# Patient Record
Sex: Female | Born: 1935 | Race: Black or African American | Hispanic: No | Marital: Single | Smoking: Never smoker
Health system: Southern US, Community
[De-identification: ages and names within clinical notes are randomized; demographics above are authoritative.]

## PROBLEM LIST (undated history)

## (undated) DIAGNOSIS — I10 Essential (primary) hypertension: Secondary | ICD-10-CM

## (undated) DIAGNOSIS — K922 Gastrointestinal hemorrhage, unspecified: Secondary | ICD-10-CM

## (undated) DIAGNOSIS — E119 Type 2 diabetes mellitus without complications: Secondary | ICD-10-CM

## (undated) DIAGNOSIS — D649 Anemia, unspecified: Secondary | ICD-10-CM

## (undated) DIAGNOSIS — K5790 Diverticulosis of intestine, part unspecified, without perforation or abscess without bleeding: Secondary | ICD-10-CM

## (undated) HISTORY — PX: ABDOMINAL HYSTERECTOMY: SHX81

---

## 2015-08-24 DIAGNOSIS — I10 Essential (primary) hypertension: Secondary | ICD-10-CM | POA: Diagnosis not present

## 2015-08-24 DIAGNOSIS — E119 Type 2 diabetes mellitus without complications: Secondary | ICD-10-CM | POA: Diagnosis not present

## 2015-08-24 DIAGNOSIS — M2040 Other hammer toe(s) (acquired), unspecified foot: Secondary | ICD-10-CM | POA: Diagnosis not present

## 2015-08-24 DIAGNOSIS — E785 Hyperlipidemia, unspecified: Secondary | ICD-10-CM | POA: Diagnosis not present

## 2015-09-20 DIAGNOSIS — K922 Gastrointestinal hemorrhage, unspecified: Secondary | ICD-10-CM

## 2015-09-20 HISTORY — DX: Gastrointestinal hemorrhage, unspecified: K92.2

## 2015-09-22 ENCOUNTER — Encounter (HOSPITAL_COMMUNITY): Payer: Self-pay | Admitting: Family Medicine

## 2015-09-22 ENCOUNTER — Emergency Department (HOSPITAL_COMMUNITY): Payer: Medicare Other

## 2015-09-22 ENCOUNTER — Observation Stay (HOSPITAL_COMMUNITY): Payer: Medicare Other

## 2015-09-22 ENCOUNTER — Inpatient Hospital Stay (HOSPITAL_COMMUNITY)
Admission: EM | Admit: 2015-09-22 | Discharge: 2015-09-29 | DRG: 378 | Disposition: A | Discharge status: Home Health | Payer: Medicare Other | Attending: Internal Medicine | Admitting: Internal Medicine

## 2015-09-22 DIAGNOSIS — K5733 Diverticulitis of large intestine without perforation or abscess with bleeding: Principal | ICD-10-CM | POA: Diagnosis present

## 2015-09-22 DIAGNOSIS — Z23 Encounter for immunization: Secondary | ICD-10-CM | POA: Diagnosis not present

## 2015-09-22 DIAGNOSIS — Z6821 Body mass index (BMI) 21.0-21.9, adult: Secondary | ICD-10-CM | POA: Diagnosis not present

## 2015-09-22 DIAGNOSIS — D62 Acute posthemorrhagic anemia: Secondary | ICD-10-CM

## 2015-09-22 DIAGNOSIS — K5521 Angiodysplasia of colon with hemorrhage: Secondary | ICD-10-CM | POA: Diagnosis present

## 2015-09-22 DIAGNOSIS — K922 Gastrointestinal hemorrhage, unspecified: Secondary | ICD-10-CM

## 2015-09-22 DIAGNOSIS — G2581 Restless legs syndrome: Secondary | ICD-10-CM

## 2015-09-22 DIAGNOSIS — R159 Full incontinence of feces: Secondary | ICD-10-CM | POA: Diagnosis present

## 2015-09-22 DIAGNOSIS — R000 Tachycardia, unspecified: Secondary | ICD-10-CM | POA: Diagnosis not present

## 2015-09-22 DIAGNOSIS — E119 Type 2 diabetes mellitus without complications: Secondary | ICD-10-CM | POA: Diagnosis not present

## 2015-09-22 DIAGNOSIS — Z862 Personal history of diseases of the blood and blood-forming organs and certain disorders involving the immune mechanism: Secondary | ICD-10-CM | POA: Diagnosis not present

## 2015-09-22 DIAGNOSIS — M1990 Unspecified osteoarthritis, unspecified site: Secondary | ICD-10-CM

## 2015-09-22 DIAGNOSIS — K625 Hemorrhage of anus and rectum: Secondary | ICD-10-CM

## 2015-09-22 DIAGNOSIS — K5793 Diverticulitis of intestine, part unspecified, without perforation or abscess with bleeding: Secondary | ICD-10-CM

## 2015-09-22 DIAGNOSIS — I959 Hypotension, unspecified: Secondary | ICD-10-CM | POA: Diagnosis present

## 2015-09-22 DIAGNOSIS — Z791 Long term (current) use of non-steroidal anti-inflammatories (NSAID): Secondary | ICD-10-CM | POA: Diagnosis not present

## 2015-09-22 DIAGNOSIS — K5730 Diverticulosis of large intestine without perforation or abscess without bleeding: Secondary | ICD-10-CM | POA: Diagnosis not present

## 2015-09-22 DIAGNOSIS — K921 Melena: Secondary | ICD-10-CM

## 2015-09-22 DIAGNOSIS — Z7984 Long term (current) use of oral hypoglycemic drugs: Secondary | ICD-10-CM | POA: Diagnosis not present

## 2015-09-22 DIAGNOSIS — E440 Moderate protein-calorie malnutrition: Secondary | ICD-10-CM | POA: Diagnosis not present

## 2015-09-22 DIAGNOSIS — Z978 Presence of other specified devices: Secondary | ICD-10-CM | POA: Diagnosis not present

## 2015-09-22 DIAGNOSIS — I10 Essential (primary) hypertension: Secondary | ICD-10-CM

## 2015-09-22 DIAGNOSIS — I517 Cardiomegaly: Secondary | ICD-10-CM | POA: Diagnosis not present

## 2015-09-22 DIAGNOSIS — I119 Hypertensive heart disease without heart failure: Secondary | ICD-10-CM | POA: Diagnosis present

## 2015-09-22 DIAGNOSIS — D649 Anemia, unspecified: Secondary | ICD-10-CM

## 2015-09-22 HISTORY — DX: Essential (primary) hypertension: I10

## 2015-09-22 LAB — BASIC METABOLIC PANEL
ANION GAP: 11 (ref 5–15)
BUN: 25 mg/dL — ABNORMAL HIGH (ref 6–20)
CALCIUM: 9.6 mg/dL (ref 8.9–10.3)
CHLORIDE: 104 mmol/L (ref 101–111)
CO2: 22 mmol/L (ref 22–32)
Creatinine, Ser: 1.05 mg/dL — ABNORMAL HIGH (ref 0.44–1.00)
GFR calc non Af Amer: 44 mL/min — ABNORMAL LOW (ref >60)
GFR, EST AFRICAN AMERICAN: 51 mL/min — AB (ref >60)
Glucose, Bld: 148 mg/dL — ABNORMAL HIGH (ref 65–99)
Potassium: 5.2 mmol/L — ABNORMAL HIGH (ref 3.5–5.1)
Sodium: 137 mmol/L (ref 135–145)

## 2015-09-22 LAB — CBC WITH DIFFERENTIAL/PLATELET
BASOS PCT: 0 %
Basophils Absolute: 0 10*3/uL (ref 0.0–0.1)
Eosinophils Absolute: 0 10*3/uL (ref 0.0–0.7)
Eosinophils Relative: 0 %
HEMATOCRIT: 29.4 % — AB (ref 36.0–46.0)
HEMOGLOBIN: 8.9 g/dL — AB (ref 12.0–15.0)
LYMPHS ABS: 1.1 10*3/uL (ref 0.7–4.0)
Lymphocytes Relative: 11 %
MCH: 26.3 pg (ref 26.0–34.0)
MCHC: 30.3 g/dL (ref 30.0–36.0)
MCV: 86.7 fL (ref 78.0–100.0)
MONOS PCT: 4 %
Monocytes Absolute: 0.4 10*3/uL (ref 0.1–1.0)
NEUTROS ABS: 8.7 10*3/uL — AB (ref 1.7–7.7)
NEUTROS PCT: 85 %
Platelets: 232 10*3/uL (ref 150–400)
RBC: 3.39 MIL/uL — AB (ref 3.87–5.11)
RDW: 12.9 % (ref 11.5–15.5)
WBC: 10.2 10*3/uL (ref 4.0–10.5)

## 2015-09-22 LAB — CBC
HCT: 20.7 % — ABNORMAL LOW (ref 36.0–46.0)
HEMOGLOBIN: 6.7 g/dL — AB (ref 12.0–15.0)
MCH: 28 pg (ref 26.0–34.0)
MCHC: 32.4 g/dL (ref 30.0–36.0)
MCV: 86.6 fL (ref 78.0–100.0)
Platelets: 174 10*3/uL (ref 150–400)
RBC: 2.39 MIL/uL — AB (ref 3.87–5.11)
RDW: 13.5 % (ref 11.5–15.5)
WBC: 13 10*3/uL — ABNORMAL HIGH (ref 4.0–10.5)

## 2015-09-22 LAB — PREPARE RBC (CROSSMATCH)

## 2015-09-22 LAB — PROTIME-INR
INR: 1.09 (ref 0.00–1.49)
Prothrombin Time: 14.3 seconds (ref 11.6–15.2)

## 2015-09-22 LAB — CBG MONITORING, ED: GLUCOSE-CAPILLARY: 74 mg/dL (ref 65–99)

## 2015-09-22 LAB — GLUCOSE, CAPILLARY
Glucose-Capillary: 111 mg/dL — ABNORMAL HIGH (ref 65–99)
Glucose-Capillary: 131 mg/dL — ABNORMAL HIGH (ref 65–99)

## 2015-09-22 LAB — ABO/RH: ABO/RH(D): O POS

## 2015-09-22 MED ORDER — INSULIN ASPART 100 UNIT/ML ~~LOC~~ SOLN
0.0000 [IU] | SUBCUTANEOUS | Status: DC
  Administered 2015-09-23 (×2): 1 [IU] via SUBCUTANEOUS
  Administered 2015-09-24: 2 [IU] via SUBCUTANEOUS
  Administered 2015-09-24 – 2015-09-25 (×4): 1 [IU] via SUBCUTANEOUS
  Administered 2015-09-25: 2 [IU] via SUBCUTANEOUS
  Administered 2015-09-26: 3 [IU] via SUBCUTANEOUS
  Administered 2015-09-26: 1 [IU] via SUBCUTANEOUS

## 2015-09-22 MED ORDER — SODIUM CHLORIDE 0.9 % IV BOLUS (SEPSIS)
1000.0000 mL | Freq: Once | INTRAVENOUS | Status: AC
  Administered 2015-09-22: 1000 mL via INTRAVENOUS

## 2015-09-22 MED ORDER — SODIUM CHLORIDE 0.9 % IV SOLN: Freq: Once | INTRAVENOUS | Status: DC

## 2015-09-22 MED ORDER — SODIUM CHLORIDE 0.9 % IV SOLN: INTRAVENOUS | Status: AC

## 2015-09-22 MED ORDER — MIDAZOLAM HCL 2 MG/2ML IJ SOLN
INTRAMUSCULAR | Status: AC
  Filled 2015-09-22: qty 2

## 2015-09-22 MED ORDER — FENTANYL CITRATE (PF) 100 MCG/2ML IJ SOLN
INTRAMUSCULAR | Status: AC | PRN
  Administered 2015-09-22: 37.5 ug via INTRAVENOUS
  Administered 2015-09-22: 12.5 ug via INTRAVENOUS

## 2015-09-22 MED ORDER — FENTANYL CITRATE (PF) 100 MCG/2ML IJ SOLN
INTRAMUSCULAR | Status: AC
  Filled 2015-09-22: qty 2

## 2015-09-22 MED ORDER — IOPAMIDOL (ISOVUE-300) INJECTION 61%
INTRAVENOUS | Status: AC
  Administered 2015-09-22: 100 mL
  Filled 2015-09-22: qty 100

## 2015-09-22 MED ORDER — SODIUM CHLORIDE 0.9 % IV BOLUS (SEPSIS)
500.0000 mL | Freq: Once | INTRAVENOUS | Status: AC
  Administered 2015-09-22: 500 mL via INTRAVENOUS

## 2015-09-22 MED ORDER — PANTOPRAZOLE SODIUM 40 MG IV SOLR
40.0000 mg | Freq: Once | INTRAVENOUS | Status: AC
  Administered 2015-09-22: 40 mg via INTRAVENOUS
  Filled 2015-09-22: qty 40

## 2015-09-22 MED ORDER — SODIUM CHLORIDE 0.9 % IV SOLN
INTRAVENOUS | Status: AC
  Administered 2015-09-22: 14:00:00 via INTRAVENOUS

## 2015-09-22 MED ORDER — LIDOCAINE HCL 1 % IJ SOLN
INTRAMUSCULAR | Status: AC
  Filled 2015-09-22: qty 20

## 2015-09-22 MED ORDER — TECHNETIUM TC 99M-LABELED RED BLOOD CELLS IV KIT
25.0000 | PACK | Freq: Once | INTRAVENOUS | Status: AC | PRN
  Administered 2015-09-22: 25 via INTRAVENOUS

## 2015-09-22 MED ORDER — SODIUM CHLORIDE 0.9 % IV SOLN
INTRAVENOUS | Status: AC
  Administered 2015-09-22: 22:00:00 via INTRAVENOUS

## 2015-09-22 MED ORDER — CEFAZOLIN SODIUM 1-5 GM-% IV SOLN
INTRAVENOUS | Status: AC
  Filled 2015-09-22: qty 50

## 2015-09-22 MED ORDER — MIDAZOLAM HCL 2 MG/2ML IJ SOLN
INTRAMUSCULAR | Status: AC | PRN
  Administered 2015-09-22 (×3): 0.5 mg via INTRAVENOUS

## 2015-09-22 NOTE — Sedation Documentation (Signed)
Patient denies pain and is resting comfortably.  

## 2015-09-22 NOTE — ED Provider Notes (Signed)
Medical screening examination/treatment/procedure(s) were conducted as a shared visit with non-physician practitioner(s) and myself.  I personally evaluated the patient during the encounter.   EKG Interpretation None      82 year old female who presents with lower GI bleeding. She has a history of hypertension, and is not on any blood thinners. States that she has been in her usual state of health until yesterday evening she had low abdominal cramping. Early this a.m. had episode of passing blood to her rectum. Currently denies any chest pain, abdominal pain, lightheadedness, syncope, or shortness of breath. States that she does have a remote history of bleeding from her rectum, which resolved after a day. States that remotely she did have a colonoscopy, but is unsure of what it suggested. On arrival, is mildly tachycardic but normotensive. Has a soft and benign abdomen. With anemia of 8.9, but no prior hemoglobin for comparison. Does have evidence of hematochezia on exam. Discussed with Dr. Beverely RisenGanam from GI who will see in the hospital. Admitted to hospitalist service for ongoing management.  Lavera Guiseana Duo Tasheema Perrone, MD 09/22/15 [phone]

## 2015-09-22 NOTE — Consult Note (Signed)
Subjective:   HPI  The patient is a 82 year old female who was brought to the emergency room by her daughter because earlier today she noticed reddish and darkish reddish blood running down her leg and on the floor. According to the patient the blood was a darker reddish color between the color of stool and red blood and there were some clots. She denied abdominal pain. She denied vomiting or hematemesis. She has never had an ulcer. She does not take aspirin or NSAIDs.  Review of Systems No chest pain or shortness of breath  Past Medical History  Diagnosis Date  . Hypertension    History reviewed. No pertinent past surgical history. Social History   Social History  . Marital Status: Single    Spouse Name: N/A  . Number of Children: N/A  . Years of Education: N/A   Occupational History  . Not on file.   Social History Main Topics  . Smoking status: Never Smoker   . Smokeless tobacco: Not on file  . Alcohol Use: Not on file  . Drug Use: Not on file  . Sexual Activity: Not on file   Other Topics Concern  . Not on file   Social History Narrative  . No narrative on file   family history is not on file.  Current facility-administered medications:  .  0.9 %  sodium chloride infusion, , Intravenous, STAT, Emily West, PA-C, Last Rate: 75 mL/hr at 09/22/15 1335 No current outpatient prescriptions on file. No Known Allergies   Objective:     BP 122/67 mmHg  Pulse 99  Temp(Src) 97.9 F (36.6 C) (Oral)  Resp 24  SpO2 100%  She is in no acute distress  Heart regular rhythm no murmurs  Lungs clear  Abdomen: Bowel sounds normal, soft, nontender  BUN is 25  Laboratory No components found for: D1    Assessment:     Lower GI bleed. Given the description of her bleeding and the BUN being only 25 believe this is a lower GI bleed. She also had no upper GI symptoms and she is not sick appearing or hypotensive.      Plan:     Admit. Transfuse blood. Obtain nuclear  medicine GI bleeding scan. If positive discuss with interventional radiology about embolization. If negative treat symptomatically and observe. We will follow. Lab Results  Component Value Date   HGB 8.9* 09/22/2015   HCT 29.4* 09/22/2015

## 2015-09-22 NOTE — Progress Notes (Signed)
Spoke with Dr. Bonnielee HaffHoss of IR service about embolization procedure which was unsuccessful in resolving active lower GI bleed at the splenic flexure. She was noted to be hypotensive. Based on this he recommends with transfer to an ICU bed for close monitoring and supportive care. Agree with recommendation. Spoke with Dr. Madilyn FiremanHayes from GI service recommending management as appropriate for active diverticular or other lower GI bleed. Initial plan to include frequent status check and vitals, IVF replacement and blood products.  Physical exam was repeated on arrival to 53M unit after IR procedure: Vital signs tachycardic at 114bpm with BP 113/64. Patient was alert and oriented x3 following all commands during exam, motor strength 5/5 b/l Heart rate is mildly tachycardic with a regular rhythm, no JVD, palpable pulses b/l upper and lower extremities, Abdomen is minimally TTP, nondistended Skin condition is warm and dry with grossly normal turgor, Red blood present on perianal skin surfaces with dark clots  PCCM was consulted regarding this patient. Initial plan will be management as stepdown patient on medicine service. Low threshold for fluid boluses if worsening hypotension. Recheck CBC 1 hour post transfusion then q6hrs. If Hgb<8 will repeat transfusion. If signs of active bleeding noted will repeat transfusion. If she becomes hypotensive refractory to IV fluid boluses will plan on transfer to ICU service. We will discuss with the family if they would want surgical consultation versus continued supportive care in this 82 y/o woman.

## 2015-09-22 NOTE — H&P (Signed)
Date: 09/22/2015               Patient Name:  Sharon Conway MRN: [id number]  DOB: 1918/06/07 Age / Sex: 82 y.o., female   PCP: No primary care provider on file.              Medical Service: Internal Medicine Teaching Service              Attending Physician: Dr. Inez Catalina, MD    First Contact: Nida Boatman, MS 3 Pager: [phone]  Second Contact: Dr. Valentino Nose Pager: [phone]  Third Contact Dr. Hyacinth Meeker Pager: [phone]       After Hours (After 5p/  First Contact Pager: [phone]  weekends / holidays): Second Contact Pager: [phone]   Chief Complaint:  Bright red blood per rectum  History of Present Illness: Ms. Woolverton is a 82 yo F with PMH of HTN, DM, arthritis, and RLS, previously in good health, who presents to the ED with one day of passing bright red blood during bowel movements.  Pt says she ate fish last night and developed diffuse cramping abdominal pain.  This morning she had an episode of fecal incontinence with bright red blood.  Bleeding was brisk enough to travel down her leg and stain the carpet at her home.  She passed both clotted blood and free blood.  Had a similar episode upon arrival at ED.  Pt states that her abdominal pain from yesterday resolved overnight, but that this GI bleeding is new.  For the past three days, she has had 1 episode of watery, non-bloody diarrhea per day, with no associated nausea or vomiting.  Pt states that she had a similar episode of rectal bleeding years ago which lasted one day and resolved on its own.  Pt normally has a soft bowel movement every 4 days - she does not use stool softeners.  She has SOB when ambulating, but this is unchanged from baseline.  Pt's daughter states that pt complained of SOB when walking the dog last week.  She also endorses lower leg swelling, unchanged from baseline.  She denies any recent illnesses.  Pt was treated for a UTI 6 months ago, but has no recent abx use.  No recent changes in medications, but is on  Meloxicam for arthritis. Pt denies any history of hemorrhoids, diverticular disease, PUD, anemia.  Denies CP, changes in vision, headaches.  Pt currently lives with her daughter in the [place] area, but still sees her PCP in [place].  Pt's daughter appears to be a more accurate historian than the patient.  Meds: Current Facility-Administered Medications  Medication Dose Route Frequency Provider Last Rate Last Dose  . 0.9 %  sodium chloride infusion   Intravenous STAT Trixie Dredge, PA-C 75 mL/hr at 09/22/15 1335    . 0.9 %  sodium chloride infusion   Intravenous Once Tasrif Ahmed, MD      . 0.9 %  sodium chloride infusion   Intravenous STAT Tasrif Ahmed, MD      . insulin aspart (novoLOG) injection 0-9 Units  0-9 Units Subcutaneous Q4H Tasrif Ahmed, MD   0 Units at 09/22/15 1406   Current Outpatient Prescriptions  Medication Sig Dispense Refill  . amLODipine (NORVASC) 5 MG tablet Take 5 mg by mouth daily.    . meloxicam (MOBIC) 15 MG tablet Take 15 mg by mouth daily.    . metFORMIN (GLUCOPHAGE) 500 MG tablet Take 500 mg by mouth 2 (two) times daily with  a meal.    . rOPINIRole (REQUIP) 2 MG tablet Take 2 mg by mouth at bedtime.    . solifenacin (VESICARE) 5 MG tablet Take 5 mg by mouth daily.      Allergies: NKDA  PMH:  HTN T2DM Arthritis  Surgical history: Hysterectomy   Social History: Currently lives with daughter in [place].  Never smoker.    Review of Systems: Review of Systems  Constitutional: Negative for fever and chills.  HENT: Negative for congestion.   Eyes: Negative for blurred vision and double vision.  Respiratory: Positive for cough and shortness of breath (when ambulating - unchanged from baseline). Negative for wheezing.   Cardiovascular: Positive for leg swelling (unchanged from baseline). Negative for chest pain and palpitations.  Gastrointestinal: Positive for diarrhea and blood in stool. Negative for nausea, vomiting and abdominal pain.    Genitourinary: Negative for dysuria and urgency.  Musculoskeletal: Negative for back pain and falls.  Neurological: Negative for dizziness, focal weakness, weakness and headaches.     Physical Exam: Blood pressure 129/64, pulse 100, temperature 97.9 F (36.6 C), temperature source Oral, resp. rate 26, SpO2 100 %. BP 129/64 mmHg  Pulse 100  Temp(Src) 97.9 F (36.6 C) (Oral)  Resp 26  SpO2 100%  General Appearance:    Pleasant female.  Alert, cooperative, no distress.  AOx3  HEENT:    PERRL, conjunctiva/corneas clear.    Neck:   Supple, symmetrical, trachea midline, no adenopathy.  Lungs:     Clear to auscultation bilaterally, respirations unlabored   CV:     Regular rate and rhythm. No murmur, rub or gallop.  No JVD.  2+ radial and DP pulses.  Normal cap refill.   Abdomen:     Soft, non-tender, bowel sounds active all four quadrants,    no masses, no organomegaly  Extremities:   1+ edema up to ankles in bilateral lower extremities.   Skin:   Skin color, texture, turgor normal, no rashes or lesions  Neurologic:   Normal strength and ROM.     Lab results: CBC    Component Value Date/Time   WBC 10.2 09/22/2015 1048   RBC 3.39* 09/22/2015 1048   HGB 8.9* 09/22/2015 1048   HCT 29.4* 09/22/2015 1048   PLT 232 09/22/2015 1048   MCV 86.7 09/22/2015 1048   MCH 26.3 09/22/2015 1048   MCHC 30.3 09/22/2015 1048   RDW 12.9 09/22/2015 1048   LYMPHSABS 1.1 09/22/2015 1048   MONOABS 0.4 09/22/2015 1048   EOSABS 0.0 09/22/2015 1048   BASOSABS 0.0 09/22/2015 1048   BMP Latest Ref Rng 09/22/2015  Glucose 65 - 99 mg/dL 161(W)  BUN 6 - 20 mg/dL 96(E)  Creatinine 4.54 - 1.00 mg/dL 0.98(J)  Sodium 191 - 478 mmol/L 137  Potassium 3.5 - 5.1 mmol/L 5.2(H)  Chloride 101 - 111 mmol/L 104  CO2 22 - 32 mmol/L 22  Calcium 8.9 - 10.3 mg/dL 9.6    Imaging results:  Dg Chest Portable 1 View  09/22/2015  CLINICAL DATA:  Shortness of breath on exertion with occasional cough. EXAM: PORTABLE CHEST  1 VIEW COMPARISON:  None. FINDINGS: Cardiomegaly. Aortic atherosclerosis. Hyperinflation. No active infiltrates or failure. No effusion or pneumothorax. Osteopenia. IMPRESSION: Cardiomegaly.  No active infiltrates or failure. Electronically Signed   By: Elsie Stain M.D.   On: 09/22/2015 11:52    Assessment & Plan by Problem: 82 yo F presenting with one day of BRBPR and three days of diarrhea, Hb of 8.9 and BUN  25.     Acute GI bleed with anemia Given painless bleeding, most concerned for diverticulosis, which is common in older adults and typically presents with bright red rectal bleeding without any associated symptoms.  Also on the differential is angiodysplasia/AVM given painless rectal bleeding without other associated symptoms.  Less concern for upper GI bleeding given only slightly elevated BUN - would expect BUN to be higher and vitals to be unstable if brisk upper GI bleed.  Less likely to be gastroenteritis d/t no nausea, vomiting, or fevers.  Colon cancer also on differential, but less likely given normocytic anemia not suggestive of iron deficiency anemia, which would point more towards colon cancer.  Acute mesenteric ischemia less likely given normal vital signs and no abdominal pain today. Hemorrhoids also less likely given volume of bleeding and no history of liver disease or anticoagulant use. -GI following, appreciate recs.  -Nuclear medicine tagged RBC scan to look for source of bleeding. -If NM scan identifies bleeding, will undergo angio+embolization with VIR   -Follow CBC q6.  Tranfuse if Hb <7 -Start on Protonix  -Start on NS @ 75 mL/hr  -Continue NPO pending results of tagged RBC scan    Hypertension -well-controlled -hold amlodipine in setting of acute GI bleed   DM -continue home Novolog -hold home Metformin   Arthritis -hold Meloxicam in setting of acute GI bleed   Restless leg syndrome -hold ropinirole    This is a Psychologist, occupationalMedical Student Note.  The care of the  patient was discussed with Dr. Valentino NoseNathan Boswell and the assessment and plan was formulated with their assistance.  Please see their note for official documentation of the patient encounter.   Signed: Nathaneil Canaryolleen M Jolleen Seman, Med Student 09/22/2015, 2:37 PM

## 2015-09-22 NOTE — Consult Note (Signed)
Chief Complaint: Patient was seen in consultation today for mesenteric/visceral arteriogram with possible embolization Chief Complaint  Patient presents with  . Vaginal Bleeding    Referring Physician(s): Ganem,Sam  Supervising Physician: Ruel Favors  Patient Status: In-pt   History of Present Illness: Sharon Conway is a 82 y.o. female with PMH HTN who presented to ED today with rectal bleeding. At the time she denied abd pain,N/V . Only surgical hx noted was remote hysterectomy. She lives in [place] and is here visiting family.  She states she had similar episode years ago but didn't seek treatment or go to hospital. Currently she states she has some lower pelvic discomfort as well as some dyspnea with exertion. NM study today reveals active bleeding in sigmoid colon region. GI service now requesting visceral arteriogram with possible embolization.   Past Medical History  Diagnosis Date  . Hypertension     Past Surgical History  Procedure Laterality Date  . Abdominal hysterectomy      Allergies: Review of patient's allergies indicates no known allergies.  Medications: Prior to Admission medications   Medication Sig Start Date End Date Taking? Authorizing Provider  amLODipine (NORVASC) 5 MG tablet Take 5 mg by mouth daily.   Yes Historical Provider, MD  meloxicam (MOBIC) 15 MG tablet Take 15 mg by mouth daily.   Yes Historical Provider, MD  metFORMIN (GLUCOPHAGE) 500 MG tablet Take 500 mg by mouth 2 (two) times daily with a meal.   Yes Historical Provider, MD  rOPINIRole (REQUIP) 2 MG tablet Take 2 mg by mouth at bedtime.   Yes Historical Provider, MD  solifenacin (VESICARE) 5 MG tablet Take 5 mg by mouth daily.   Yes Historical Provider, MD     History reviewed. No pertinent family history.  Social History   Social History  . Marital Status: Single    Spouse Name: N/A  . Number of Children: N/A  . Years of Education: N/A   Social History Main Topics  .  Smoking status: Never Smoker   . Smokeless tobacco: None  . Alcohol Use: None  . Drug Use: None  . Sexual Activity: Not Asked   Other Topics Concern  . None   Social History Narrative     Review of Systems see above  Vital Signs: BP 129/64 mmHg  Pulse 100  Temp(Src) 97.9 F (36.6 C) (Oral)  Resp 26  SpO2 100%  Physical Exam  Constitutional: She is oriented to person, place, and time.  Thin BF in NAD  Cardiovascular: Regular rhythm.   Murmur heard. Sl tachy  Pulmonary/Chest: Effort normal and breath sounds normal.  Abdominal: Soft. Bowel sounds are normal.  Mild lower pelvic tenderness to palpation  Musculoskeletal: Normal range of motion. She exhibits no edema.  Neurological: She is alert and oriented to person, place, and time.    Mallampati Score:     Imaging: Nm Gi Blood Loss  09/22/2015  CLINICAL DATA:  Active GI bleed. EXAM: NUCLEAR MEDICINE GASTROINTESTINAL BLEEDING SCAN TECHNIQUE: Sequential abdominal images were obtained following intravenous administration of Tc-65m labeled red blood cells. RADIOPHARMACEUTICALS:  25.0 mCi Tc-62m in-vitro labeled red cells. COMPARISON:  None. FINDINGS: There is a tubular focus of increased uptake within the proximal left colon which increases in intensity and exhibits antegrade progression conforming to the expected configuration distal colon. Normal physiologic activity is identified within the liver, GU tract and blood pool. IMPRESSION: 1. Examination is positive for active GI bleed which appears to originate at the  level of the splenic flexure. Electronically Signed   By: Signa Kellaylor  Stroud M.D.   On: 09/22/2015 16:01   Dg Chest Portable 1 View  09/22/2015  CLINICAL DATA:  Shortness of breath on exertion with occasional cough. EXAM: PORTABLE CHEST 1 VIEW COMPARISON:  None. FINDINGS: Cardiomegaly. Aortic atherosclerosis. Hyperinflation. No active infiltrates or failure. No effusion or pneumothorax. Osteopenia. IMPRESSION:  Cardiomegaly.  No active infiltrates or failure. Electronically Signed   By: Elsie StainJohn T Curnes M.D.   On: 09/22/2015 11:52    Labs:  CBC:  Recent Labs  09/22/15 1048  WBC 10.2  HGB 8.9*  HCT 29.4*  PLT 232    COAGS: No results for input(s): INR, APTT in the last 8760 hours.  BMP:  Recent Labs  09/22/15 1048  NA 137  K 5.2*  CL 104  CO2 22  GLUCOSE 148*  BUN 25*  CALCIUM 9.6  CREATININE 1.05*  GFRNONAA 44*  GFRAA 51*    LIVER FUNCTION TESTS: No results for input(s): BILITOT, AST, ALT, ALKPHOS, PROT, ALBUMIN in the last 8760 hours.  TUMOR MARKERS: No results for input(s): AFPTM, CEA, CA199, CHROMGRNA in the last 8760 hours.  Assessment and Plan: Pt with acute GI bleed with positive NM study in sigmoid colon region. Plan today is for visceral arteriogram with poss embolization . Risks and benefits discussed with the patient/family including, but not limited to bleeding, infection, vascular injury or contrast induced renal failure.All of the patient's questions were answered, patient is agreeable to proceed. Consent signed and in chart. Labs checked.     Thank you for this interesting consult.  I greatly enjoyed meeting Franco Nonesdna Termini and look forward to participating in their care.  A copy of this report was sent to the requesting provider on this date.  Electronically Signed: D. Jeananne RamaKevin Shaletta Hinostroza 09/22/2015, 4:07 PM   I spent a total of 40 minutes in face to face in clinical consultation, greater than 50% of which was counseling/coordinating care for mesenteric arteriogram with possible embolization

## 2015-09-22 NOTE — Progress Notes (Signed)
CRITICAL VALUE ALERT  Critical value received:  Hemoglobin 6.7   Date of notification: 09/22/15  Time of notification:  2221  Critical value read back:Yes.    Nurse who received alert:  Osvaldo AngstEllie Amelie Caracci Rn  MD notified (1st page):  N/A  Time of first page:  N/A  MD notified (2nd page):  Time of second page:  Responding MD: E. Hoffman  Time MD responded:2225  New orders carried out. Will continue to monitor. Briant CedarFraizer, Rykar Lebleu RN BSN

## 2015-09-22 NOTE — ED Notes (Signed)
Pt here for bleeding. sts unsure if its from rectum or vagina. sts started this am. sts some abd cramping this am. Denies pain now.

## 2015-09-22 NOTE — ED Provider Notes (Signed)
CSN: [id number]     Arrival date & time 09/22/15  1191 History   None    Chief Complaint  Patient presents with  . Vaginal Bleeding     (Consider location/radiation/quality/duration/timing/severity/associated sxs/prior Treatment) HPI   Pt with hx HTN presents with rectal bleeding.  States last night around midnight her abdomen started hurting, this resolved, and then this morning she woke up with blood running down her legs and dripping on the floor.  She denies any pain presently.  Has irregular bowel movements at baseline, has had diarrhea for the past 3 days.  Daughter notes she has been coughing and had strange cough while she was bleeding this morning.  She is not on blood thinners.  Denies fevers, chills, myalgias, generalized weakness, urinary symptoms.  She is s/p hysterectomy as a young person.  Denies any other abdominal surgeries.  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  PCP is in [place], [place].     Past Medical History  Diagnosis Date  . Hypertension    History reviewed. No pertinent past surgical history. History reviewed. No pertinent family history. Social History  Substance Use Topics  . Smoking status: Never Smoker   . Smokeless tobacco: None  . Alcohol Use: None   OB History    No data available     Review of Systems  All other systems reviewed and are negative.     Allergies  Review of patient's allergies indicates no known allergies.  Home Medications   Prior to Admission medications   Not on File   BP 113/57 mmHg  Pulse 110  Temp(Src) 97.9 F (36.6 C) (Oral)  Resp 16  SpO2 100% Physical Exam  Constitutional: She appears well-developed and well-nourished. No distress.  HENT:  Head: Normocephalic and atraumatic.  Neck: Neck supple.  Cardiovascular: Normal rate and  regular rhythm.   Pulmonary/Chest: Effort normal and breath sounds normal. No respiratory distress. She has no wheezes. She has no rales.  Abdominal: Soft. She exhibits no distension and no mass. There is no tenderness. There is no rebound and no guarding.  Genitourinary: Rectal exam shows no external hemorrhoid, no mass, no tenderness and anal tone normal.  Adult diaper with large amount of dark red blood with clots.  Perianal area cleansed prior to rectal exam - frank dark red blood coating glove but not flowing from rectum.  No stool noted on glove.    Neurological: She is alert.  Skin: She is not diaphoretic.  Nursing note and vitals reviewed.   ED Course  Procedures (including critical care time) Labs Review Labs Reviewed  BASIC METABOLIC PANEL - Abnormal; Notable for the following:    Potassium 5.2 (*)    Glucose, Bld 148 (*)    BUN 25 (*)    Creatinine, Ser 1.05 (*)    GFR calc non Af Amer 44 (*)    GFR calc Af Amer 51 (*)    All other components within normal limits  CBC WITH DIFFERENTIAL/PLATELET - Abnormal; Notable for the following:    RBC 3.39 (*)    Hemoglobin 8.9 (*)    HCT 29.4 (*)    Neutro Abs 8.7 (*)    All other components within normal limits  CBC  TYPE AND SCREEN  ABO/RH    Imaging Review Dg Chest  Portable 1 View  09/22/2015  CLINICAL DATA:  Shortness of breath on exertion with occasional cough. EXAM: PORTABLE CHEST 1 VIEW COMPARISON:  None. FINDINGS: Cardiomegaly. Aortic atherosclerosis. Hyperinflation. No active infiltrates or failure. No effusion or pneumothorax. Osteopenia. IMPRESSION: Cardiomegaly.  No active infiltrates or failure. Electronically Signed   By: Elsie StainJohn T Curnes M.D.   On: 09/22/2015 11:52   I have personally reviewed and evaluated these images and lab results as part of my medical decision-making.   EKG Interpretation None       11:37 AM I spoke with Dr Evette CristalGanem (GI) who agrees to consult, requests admission to medicine.    12:34 PM  Admitted to Internal Medicine Teaching Service.    MDM   Final diagnoses:  Gastrointestinal hemorrhage, unspecified gastritis, unspecified gastrointestinal hemorrhage type    Afebrile nontoxic patient with painless GI bleed that began this morning.  Pt has hx HTN, on unknown medication but denies blood thinner usage.  Hgb 8.9, no prior labs available.  Pt mildly tachycardic on arrival, notes SOB with exertion at baseline that is unchanged.  Discussed pt, workup, and treatment with Dr Verdie MosherLiu.  Discussed with GI, Dr Evette CristalGanem, who will see patient in consult.  Admitted to Internal Medicine Teaching Service.    Trixie Dredgemily Leonna Schlee, PA-C 09/22/15 1302  Lavera Guiseana Duo Liu, MD 09/22/15 [phone]

## 2015-09-22 NOTE — H&P (Signed)
Date: 09/22/2015               Patient Name:  Sharon Conway MRN: [id number]  DOB: Oct 24, 1918 Age / Sex: 82 y.o., female   PCP: No primary care provider on file.         Medical Service: Internal Medicine Teaching Service         Attending Physician: Dr. Inez Catalina, MD    First Contact: Nida Boatman, MS 3 Pager: [phone]  Second Contact: Dr. Valentino Nose Pager: [phone]  Third Contact Dr. Hyacinth Meeker Pager: [phone]                     After Hours (After 5p/  First Contact Pager: [phone]  weekends / holidays): Second Contact Pager: [phone]   Chief Complaint: Rectal Bleeding  History of Present Illness: Ms. Greaves is a pleasant 82 year old female with a past medical history of HTN, DM2, arthritis and RLS who presents to the ED today with complaints of bright red blood per rectum. Patient reports that last night she developed some abdominal cramping pain and discomfort after dinner. Had one episode of diarrhea last night with no blood or melena present. This morning she had an episode of stool incontinence with a grossly bloody bowel movement. Both bright red blood and blood clots were noted at that time that ran down her legs and stained the carpet. She denies any further abdominal pain since last night. Denies any nausea or vomiting. Takes Meloxicam for arthritis but denies any other NSAID use or aspirin use. Denies any history of ulcers. Does report a remote history of bright red blood per rectum several years ago but resolved on its own without seeing any physicians. Patient reports dyspnea with exertion but unchanged from baseline. Denies any chest pain, sob at rest, vision changes, lightheadedness/dizziness.   Meds: Current Facility-Administered Medications  Medication Dose Route Frequency Provider Last Rate Last Dose  . 0.9 %  sodium chloride infusion   Intravenous STAT Trixie Dredge, PA-C 75 mL/hr at 09/22/15 1335    . 0.9 %  sodium chloride infusion   Intravenous Once  Tasrif Ahmed, MD      . 0.9 %  sodium chloride infusion   Intravenous STAT Tasrif Ahmed, MD      . insulin aspart (novoLOG) injection 0-9 Units  0-9 Units Subcutaneous Q4H Tasrif Ahmed, MD   0 Units at 09/22/15 1406   Current Outpatient Prescriptions  Medication Sig Dispense Refill  . amLODipine (NORVASC) 5 MG tablet Take 5 mg by mouth daily.    . meloxicam (MOBIC) 15 MG tablet Take 15 mg by mouth daily.    . metFORMIN (GLUCOPHAGE) 500 MG tablet Take 500 mg by mouth 2 (two) times daily with a meal.    . rOPINIRole (REQUIP) 2 MG tablet Take 2 mg by mouth at bedtime.    . solifenacin (VESICARE) 5 MG tablet Take 5 mg by mouth daily.      Allergies: Allergies as of 09/22/2015  . (No Known Allergies)   Past Medical History  Diagnosis Date  . Hypertension    Past Surgical History  Procedure Laterality Date  . Abdominal hysterectomy     History reviewed. No pertinent family history. Social History   Social History  . Marital Status: Single    Spouse Name: N/A  . Number of Children: N/A  . Years of Education: N/A   Occupational History  . Not on file.   Social History  Main Topics  . Smoking status: Never Smoker   . Smokeless tobacco: Not on file  . Alcohol Use: Not on file  . Drug Use: Not on file  . Sexual Activity: Not on file   Other Topics Concern  . Not on file   Social History Narrative    Review of Systems: Pertinent items noted in HPI and remainder of comprehensive ROS otherwise negative.  Physical Exam: Blood pressure 129/64, pulse 100, temperature 97.9 F (36.6 C), temperature source Oral, resp. rate 26, SpO2 100 %. General: alert, well-developed, and cooperative to examination.  Head: normocephalic and atraumatic.  Eyes: vision grossly intact, pupils equal, pupils round, pupils reactive to light, no injection and anicteric.  Mouth: pharynx pink and dry, no erythema, and no exudates.  Neck: supple, full ROM, no thyromegaly, no JVD, and no carotid bruits.    Lungs: normal respiratory effort, no accessory muscle use, normal breath sounds, no crackles, and no wheezes. Heart: normal rate, regular rhythm, no murmur, no gallop, and no rub.  Abdomen: soft, non-tender, normal bowel sounds, no distention, no guarding, no rebound tenderness Msk: no joint swelling, no joint warmth, and no redness over joints.  Pulses: 2+ DP/PT pulses bilaterally Extremities: trace pedal edema Neurologic: alert & oriented X3, no focal deficits Skin: turgor normal and no rashes.  Psych: normal mood and affect  Lab results: Basic Metabolic Panel:  Recent Labs  16/02/9604/03/17 1048  NA 137  K 5.2*  CL 104  CO2 22  GLUCOSE 148*  BUN 25*  CREATININE 1.05*  CALCIUM 9.6   CBC:  Recent Labs  09/22/15 1048  WBC 10.2  NEUTROABS 8.7*  HGB 8.9*  HCT 29.4*  MCV 86.7  PLT 232   CBG:  Recent Labs  09/22/15 1406  GLUCAP 74   Imaging results:  Nm Gi Blood Loss  09/22/2015  CLINICAL DATA:  Active GI bleed. EXAM: NUCLEAR MEDICINE GASTROINTESTINAL BLEEDING SCAN TECHNIQUE: Sequential abdominal images were obtained following intravenous administration of Tc-8667m labeled red blood cells. RADIOPHARMACEUTICALS:  25.0 mCi Tc-1767m in-vitro labeled red cells. COMPARISON:  None. FINDINGS: There is a tubular focus of increased uptake within the proximal left colon which increases in intensity and exhibits antegrade progression conforming to the expected configuration distal colon. Normal physiologic activity is identified within the liver, GU tract and blood pool. IMPRESSION: 1. Examination is positive for active GI bleed which appears to originate at the level of the splenic flexure. Electronically Signed   By: Signa Kellaylor  Stroud M.D.   On: 09/22/2015 16:01   Dg Chest Portable 1 View  09/22/2015  CLINICAL DATA:  Shortness of breath on exertion with occasional cough. EXAM: PORTABLE CHEST 1 VIEW COMPARISON:  None. FINDINGS: Cardiomegaly. Aortic atherosclerosis. Hyperinflation. No active  infiltrates or failure. No effusion or pneumothorax. Osteopenia. IMPRESSION: Cardiomegaly.  No active infiltrates or failure. Electronically Signed   By: Elsie StainJohn T Curnes M.D.   On: 09/22/2015 11:52   Other results: EKG: Pending  Assessment & Plan by Problem:  Acute GI bleed with anemia: Patient with acute onset bright red blood per rectum with clotting. Most likely diverticular bleed in elderly patient. BUN not elevated suggestive of lower GI bleed. Potentially AVM or colon cancer. Hgb 8.9 on admission. Unclear baseline but likely from acute blood loss. Will need to trend CBC and transfuse as necessary. GI consulted in the ED and following.  -appreciate GI's help -NM tagged RBC scan positive -Will consult IR for angio+embolization -CBC q6hr, transfuse <7 -Protonix IV  -NS  75 mL/hr -NPO pending procedures -Check EKG -Telemetry  Hypertension: Stable. Borderline soft.  -hold amlodipine in setting of acute GI bleed  -NS as above -transfuse as needed  DM -CBG q4hr while NPO -SSI-s -hold home Metformin   Arthritis -hold Meloxicam in setting of acute GI bleed   Restless leg syndrome -hold ropinirole    Dispo: Disposition is deferred at this time, awaiting improvement of current medical problems. Anticipated discharge in approximately 1-2 day(s).   The patient does have a current PCP (No primary care provider on file.) and does need an [facility] hospital follow-up appointment after discharge.  The patient does not have transportation limitations that hinder transportation to clinic appointments.  Signed: Valentino Nose, MD 09/22/2015, 5:05 PM

## 2015-09-22 NOTE — Consult Note (Signed)
Reason for Consult:  Lower GI bleed Referring Physician: Michaele Amundson is an 82 y.o. female.  HPI:  Patient is a 82 year old female who was admitted on the third with bright red blood per rectum.  She had fecal incontinence associated with this. She says that she has not had recent GI bleeding but did have a similar episode 7 years ago. She denies pain or nausea or vomiting. She did have a brief episode of pain that precipitated the bleeding.  Patient had a bleeding scan that appeared to localize to the left upper quadrant.  Patient went for embolization which was unfortunately unsuccessful. Because of the unsuccessful embolization, the patient was recommended to be monitored in stepdown.  Patient is hemodynamically stable. Patient has had a recent stool that was nonbloody. She has not had a colonoscopy in many years.  Past Medical History  Diagnosis Date  . Hypertension   DM OA  Past Surgical History  Procedure Laterality Date  . Abdominal hysterectomy      History reviewed. No pertinent family history.  Social History:  reports that she has never smoked. She does not have any smokeless tobacco history on file. Her alcohol and drug histories are not on file.  Allergies: No Known Allergies  Medications:  Prior to Admission:  Prescriptions prior to admission  Medication Sig Dispense Refill Last Dose  . amLODipine (NORVASC) 5 MG tablet Take 5 mg by mouth daily.   09/21/2015 at Unknown time  . meloxicam (MOBIC) 15 MG tablet Take 15 mg by mouth daily.   09/21/2015 at Unknown time  . metFORMIN (GLUCOPHAGE) 500 MG tablet Take 500 mg by mouth 2 (two) times daily with a meal.   09/21/2015 at Unknown time  . rOPINIRole (REQUIP) 2 MG tablet Take 2 mg by mouth at bedtime.   09/21/2015 at Unknown time  . solifenacin (VESICARE) 5 MG tablet Take 5 mg by mouth daily.   09/21/2015 at Unknown time    Results for orders placed or performed during the hospital encounter of 09/22/15 (from the past 48  hour(s))  Type and screen     Status: None (Preliminary result)   Collection Time: 09/22/15 10:31 AM  Result Value Ref Range   ABO/RH(D) O POS    Antibody Screen NEG    Sample Expiration 09/25/2015    Unit Number [id number]    Blood Component Type RBC LR PHER2    Unit division 00    Status of Unit ISSUED    Transfusion Status OK TO TRANSFUSE    Crossmatch Result Compatible   ABO/Rh     Status: None   Collection Time: 09/22/15 10:31 AM  Result Value Ref Range   ABO/RH(D) O POS   Basic metabolic panel     Status: Abnormal   Collection Time: 09/22/15 10:48 AM  Result Value Ref Range   Sodium 137 135 - 145 mmol/L   Potassium 5.2 (H) 3.5 - 5.1 mmol/L   Chloride 104 101 - 111 mmol/L   CO2 22 22 - 32 mmol/L   Glucose, Bld 148 (H) 65 - 99 mg/dL   BUN 25 (H) 6 - 20 mg/dL   Creatinine, Ser 1.05 (H) 0.44 - 1.00 mg/dL   Calcium 9.6 8.9 - 10.3 mg/dL   GFR calc non Af Amer 44 (L) >60 mL/min   GFR calc Af Amer 51 (L) >60 mL/min    Comment: (NOTE) The eGFR has been calculated using the CKD EPI equation. This calculation has not been  validated in all clinical situations. eGFR's persistently <60 mL/min signify possible Chronic Kidney Disease.    Anion gap 11 5 - 15  CBC with Differential     Status: Abnormal   Collection Time: 09/22/15 10:48 AM  Result Value Ref Range   WBC 10.2 4.0 - 10.5 K/uL   RBC 3.39 (L) 3.87 - 5.11 MIL/uL   Hemoglobin 8.9 (L) 12.0 - 15.0 g/dL   HCT 29.4 (L) 36.0 - 46.0 %   MCV 86.7 78.0 - 100.0 fL   MCH 26.3 26.0 - 34.0 pg   MCHC 30.3 30.0 - 36.0 g/dL   RDW 12.9 11.5 - 15.5 %   Platelets 232 150 - 400 K/uL   Neutrophils Relative % 85 %   Neutro Abs 8.7 (H) 1.7 - 7.7 K/uL   Lymphocytes Relative 11 %   Lymphs Abs 1.1 0.7 - 4.0 K/uL   Monocytes Relative 4 %   Monocytes Absolute 0.4 0.1 - 1.0 K/uL   Eosinophils Relative 0 %   Eosinophils Absolute 0.0 0.0 - 0.7 K/uL   Basophils Relative 0 %   Basophils Absolute 0.0 0.0 - 0.1 K/uL  CBG monitoring, ED      Status: None   Collection Time: 09/22/15  2:06 PM  Result Value Ref Range   Glucose-Capillary 74 65 - 99 mg/dL  Protime-INR     Status: None   Collection Time: 09/22/15  4:45 PM  Result Value Ref Range   Prothrombin Time 14.3 11.6 - 15.2 seconds   INR 1.09 0.00 - 1.49  Prepare RBC     Status: None   Collection Time: 09/22/15  5:20 PM  Result Value Ref Range   Order Confirmation ORDER PROCESSED BY BLOOD BANK   Glucose, capillary     Status: Abnormal   Collection Time: 09/22/15  8:41 PM  Result Value Ref Range   Glucose-Capillary 111 (H) 65 - 99 mg/dL    Nm Gi Blood Loss  09/22/2015  CLINICAL DATA:  Active GI bleed. EXAM: NUCLEAR MEDICINE GASTROINTESTINAL BLEEDING SCAN TECHNIQUE: Sequential abdominal images were obtained following intravenous administration of Tc-75mlabeled red blood cells. RADIOPHARMACEUTICALS:  25.0 mCi Tc-979mn-vitro labeled red cells. COMPARISON:  None. FINDINGS: There is a tubular focus of increased uptake within the proximal left colon which increases in intensity and exhibits antegrade progression conforming to the expected configuration distal colon. Normal physiologic activity is identified within the liver, GU tract and blood pool. IMPRESSION: 1. Examination is positive for active GI bleed which appears to originate at the level of the splenic flexure. Electronically Signed   By: TaKerby Moors.D.   On: 09/22/2015 16:01   Dg Chest Portable 1 View  09/22/2015  CLINICAL DATA:  Shortness of breath on exertion with occasional cough. EXAM: PORTABLE CHEST 1 VIEW COMPARISON:  None. FINDINGS: Cardiomegaly. Aortic atherosclerosis. Hyperinflation. No active infiltrates or failure. No effusion or pneumothorax. Osteopenia. IMPRESSION: Cardiomegaly.  No active infiltrates or failure. Electronically Signed   By: JoStaci Righter.D.   On: 09/22/2015 11:52    Review of Systems  Constitutional: Negative.   HENT: Negative.   Eyes: Negative.   Respiratory: Negative.    Cardiovascular: Negative.   Gastrointestinal: Positive for blood in stool. Negative for heartburn, nausea, vomiting, abdominal pain and constipation.  Neurological: Positive for dizziness (earlier wtih bleeding).  Endo/Heme/Allergies: Negative.   Psychiatric/Behavioral: Negative.    Blood pressure 112/54, pulse 105, temperature 97.9 F (36.6 C), temperature source Oral, resp. rate 21, SpO2 100 %.  Physical Exam  Constitutional: She is oriented to person, place, and time. She appears well-developed and well-nourished. No distress.  HENT:  Head: Normocephalic and atraumatic.  Eyes: Conjunctivae are normal. Pupils are equal, round, and reactive to light. No scleral icterus.  Neck: Neck supple. No tracheal deviation present. No thyromegaly present.  Cardiovascular: Regular rhythm.   Sl tachycardic  Respiratory: Effort normal. No respiratory distress.  GI: Soft. She exhibits no distension and no mass. There is no tenderness. There is no rebound and no guarding.  Musculoskeletal: Normal range of motion.  Neurological: She is alert and oriented to person, place, and time. Coordination normal.  Skin: Skin is warm and dry. No rash noted. She is not diaphoretic. No erythema. No pallor.  Psychiatric: She has a normal mood and affect. Her behavior is normal. Judgment and thought content normal.    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  This would alter surgical plan.  The patient does seem pretty oriented and family confirms her history.  Would also recommend possible repeat attempt at embolization prior to engaging in surgical exploration, as surgery is most invasive and most difficult to recover from.    Pt age does make her higher risk for surgery, and this can make MS worse if any baseline dementia.  Risk does not appear prohibitive, though.    Recommend rechecking coags since transfusion and NPO status.    Would  be ideal if no tumor present and bleed resolved.    CCS service to follow.      Shloime Keilman 09/22/2015, 10:10 PM

## 2015-09-22 NOTE — Sedation Documentation (Signed)
Patient is resting comfortably. 

## 2015-09-22 NOTE — ED Notes (Signed)
Pt had one small, loosely formed, dark colored stool with frank blood.

## 2015-09-22 NOTE — ED Notes (Signed)
PA at bedside.

## 2015-09-22 NOTE — Procedures (Signed)
Mesenteric angio for lower GI bleeding  Positive for active bleeding at the splenic flexure  Embolization was unsuccessful.  No comp/EBL

## 2015-09-23 DIAGNOSIS — Z978 Presence of other specified devices: Secondary | ICD-10-CM

## 2015-09-23 DIAGNOSIS — K5793 Diverticulitis of intestine, part unspecified, without perforation or abscess with bleeding: Secondary | ICD-10-CM | POA: Insufficient documentation

## 2015-09-23 LAB — CBC
HCT: 26.8 % — ABNORMAL LOW (ref 36.0–46.0)
HEMATOCRIT: 26.3 % — AB (ref 36.0–46.0)
HEMATOCRIT: 22.6 % — AB (ref 36.0–46.0)
HEMOGLOBIN: 7.5 g/dL — AB (ref 12.0–15.0)
HEMOGLOBIN: 8.9 g/dL — AB (ref 12.0–15.0)
Hemoglobin: 8.6 g/dL — ABNORMAL LOW (ref 12.0–15.0)
MCH: 29.1 pg (ref 26.0–34.0)
MCH: 28.5 pg (ref 26.0–34.0)
MCH: 28.5 pg (ref 26.0–34.0)
MCHC: 32.7 g/dL (ref 30.0–36.0)
MCHC: 33.2 g/dL (ref 30.0–36.0)
MCHC: 33.2 g/dL (ref 30.0–36.0)
MCV: 88.9 fL (ref 78.0–100.0)
MCV: 85.9 fL (ref 78.0–100.0)
MCV: 85.9 fL (ref 78.0–100.0)
PLATELETS: 124 10*3/uL — AB (ref 150–400)
PLATELETS: 109 10*3/uL — AB (ref 150–400)
Platelets: 114 10*3/uL — ABNORMAL LOW (ref 150–400)
RBC: 2.96 MIL/uL — AB (ref 3.87–5.11)
RBC: 2.63 MIL/uL — AB (ref 3.87–5.11)
RBC: 3.12 MIL/uL — AB (ref 3.87–5.11)
RDW: 13.8 % (ref 11.5–15.5)
RDW: 13.9 % (ref 11.5–15.5)
RDW: 14.1 % (ref 11.5–15.5)
WBC: 16.3 10*3/uL — AB (ref 4.0–10.5)
WBC: 13.8 10*3/uL — ABNORMAL HIGH (ref 4.0–10.5)
WBC: 11.5 10*3/uL — AB (ref 4.0–10.5)

## 2015-09-23 LAB — GLUCOSE, CAPILLARY
GLUCOSE-CAPILLARY: 101 mg/dL — AB (ref 65–99)
GLUCOSE-CAPILLARY: 120 mg/dL — AB (ref 65–99)
GLUCOSE-CAPILLARY: 95 mg/dL (ref 65–99)
Glucose-Capillary: 123 mg/dL — ABNORMAL HIGH (ref 65–99)
Glucose-Capillary: 105 mg/dL — ABNORMAL HIGH (ref 65–99)
Glucose-Capillary: 132 mg/dL — ABNORMAL HIGH (ref 65–99)

## 2015-09-23 LAB — BASIC METABOLIC PANEL
ANION GAP: 6 (ref 5–15)
BUN: 27 mg/dL — ABNORMAL HIGH (ref 6–20)
CALCIUM: 6.9 mg/dL — AB (ref 8.9–10.3)
CO2: 19 mmol/L — ABNORMAL LOW (ref 22–32)
Chloride: 116 mmol/L — ABNORMAL HIGH (ref 101–111)
Creatinine, Ser: 0.9 mg/dL (ref 0.44–1.00)
GFR, EST NON AFRICAN AMERICAN: 53 mL/min — AB (ref >60)
Glucose, Bld: 128 mg/dL — ABNORMAL HIGH (ref 65–99)
POTASSIUM: 5 mmol/L (ref 3.5–5.1)
Sodium: 141 mmol/L (ref 135–145)

## 2015-09-23 LAB — PROTIME-INR
INR: 1.44 (ref 0.00–1.49)
Prothrombin Time: 17.6 seconds — ABNORMAL HIGH (ref 11.6–15.2)

## 2015-09-23 LAB — PREPARE RBC (CROSSMATCH)

## 2015-09-23 LAB — MRSA PCR SCREENING: MRSA BY PCR: NEGATIVE

## 2015-09-23 LAB — APTT: aPTT: 26 seconds (ref 24–37)

## 2015-09-23 MED ORDER — SODIUM CHLORIDE 0.9 % IV BOLUS (SEPSIS)
1000.0000 mL | Freq: Once | INTRAVENOUS | Status: AC
  Administered 2015-09-23: 1000 mL via INTRAVENOUS

## 2015-09-23 MED ORDER — CETYLPYRIDINIUM CHLORIDE 0.05 % MT LIQD
7.0000 mL | Freq: Two times a day (BID) | OROMUCOSAL | Status: DC
  Administered 2015-09-23 – 2015-09-24 (×3): 7 mL via OROMUCOSAL

## 2015-09-23 MED ORDER — CHLORHEXIDINE GLUCONATE 0.12 % MT SOLN
15.0000 mL | Freq: Two times a day (BID) | OROMUCOSAL | Status: DC
  Administered 2015-09-23 – 2015-09-24 (×2): 15 mL via OROMUCOSAL
  Filled 2015-09-23 (×2): qty 15

## 2015-09-23 MED ORDER — DEXTROSE-NACL 5-0.9 % IV SOLN
INTRAVENOUS | Status: DC
  Administered 2015-09-23 – 2015-09-24 (×2): via INTRAVENOUS

## 2015-09-23 MED ORDER — SODIUM CHLORIDE 0.9 % IV SOLN
Freq: Once | INTRAVENOUS | Status: AC
  Administered 2015-09-23: 15:00:00 via INTRAVENOUS

## 2015-09-23 MED ORDER — PANTOPRAZOLE SODIUM 40 MG IV SOLR
40.0000 mg | Freq: Two times a day (BID) | INTRAVENOUS | Status: DC
  Administered 2015-09-23 – 2015-09-24 (×3): 40 mg via INTRAVENOUS
  Filled 2015-09-23 (×3): qty 40

## 2015-09-23 NOTE — Progress Notes (Signed)
[facility] Progress Note  Subjective: Patient has no complaints. She has a rectal tube in. There is dark blood in the bag. Nurse reports it seems to be decreasing.  IR was unable to embolize the bleeding site. Surgery is following. They have recommended colonoscopy, however, I would not be able to see anything other than stool and blood at this time given her unprepped colon, and I don't think she is in any shape to prep at this time.  Objective: Vital signs in last 24 hours: Temp:  [97.6 F (36.4 C)-98.9 F (37.2 C)] 98.9 F (37.2 C) (05/04 1157) Pulse Rate:  [91-119] 116 (05/04 1300) Resp:  [15-34] 21 (05/04 1300) BP: (70-162)/(24-118) 136/39 mmHg (05/04 1300) SpO2:  [16 %-100 %] 100 % (05/04 1300) Weight change:    PE:  No distress  Abdomen soft  Lab Results: Results for orders placed or performed during the hospital encounter of 09/22/15 (from the past 24 hour(s))  Protime-INR     Status: None   Collection Time: 09/22/15  4:45 PM  Result Value Ref Range   Prothrombin Time 14.3 11.6 - 15.2 seconds   INR 1.09 0.00 - 1.49  Prepare RBC     Status: None   Collection Time: 09/22/15  5:20 PM  Result Value Ref Range   Order Confirmation ORDER PROCESSED BY BLOOD BANK   Glucose, capillary     Status: Abnormal   Collection Time: 09/22/15  8:41 PM  Result Value Ref Range   Glucose-Capillary 111 (H) 65 - 99 mg/dL  CBC     Status: Abnormal   Collection Time: 09/22/15 10:10 PM  Result Value Ref Range   WBC 13.0 (H) 4.0 - 10.5 K/uL   RBC 2.39 (L) 3.87 - 5.11 MIL/uL   Hemoglobin 6.7 (LL) 12.0 - 15.0 g/dL   HCT 16.1 (L) 09.6 - 04.5 %   MCV 86.6 78.0 - 100.0 fL   MCH 28.0 26.0 - 34.0 pg   MCHC 32.4 30.0 - 36.0 g/dL   RDW 40.9 81.1 - 91.4 %   Platelets 174 150 - 400 K/uL  Prepare RBC     Status: None   Collection Time: 09/22/15 10:25 PM  Result Value Ref Range   Order Confirmation ORDER PROCESSED BY BLOOD BANK   Glucose, capillary     Status: Abnormal    Collection Time: 09/22/15 11:27 PM  Result Value Ref Range   Glucose-Capillary 131 (H) 65 - 99 mg/dL  CBC     Status: Abnormal   Collection Time: 09/23/15  5:39 AM  Result Value Ref Range   WBC 16.3 (H) 4.0 - 10.5 K/uL   RBC 2.96 (L) 3.87 - 5.11 MIL/uL   Hemoglobin 8.6 (L) 12.0 - 15.0 g/dL   HCT 78.2 (L) 95.6 - 21.3 %   MCV 88.9 78.0 - 100.0 fL   MCH 29.1 26.0 - 34.0 pg   MCHC 32.7 30.0 - 36.0 g/dL   RDW 08.6 57.8 - 46.9 %   Platelets 124 (L) 150 - 400 K/uL  Basic metabolic panel     Status: Abnormal   Collection Time: 09/23/15  5:39 AM  Result Value Ref Range   Sodium 141 135 - 145 mmol/L   Potassium 5.0 3.5 - 5.1 mmol/L   Chloride 116 (H) 101 - 111 mmol/L   CO2 19 (L) 22 - 32 mmol/L   Glucose, Bld 128 (H) 65 - 99 mg/dL   BUN 27 (H) 6 - 20 mg/dL   Creatinine, Ser  0.90 0.44 - 1.00 mg/dL   Calcium 6.9 (L) 8.9 - 10.3 mg/dL   GFR calc non Af Amer 53 (L) >60 mL/min   GFR calc Af Amer >60 >60 mL/min   Anion gap 6 5 - 15  APTT     Status: None   Collection Time: 09/23/15  5:39 AM  Result Value Ref Range   aPTT 26 24 - 37 seconds  Protime-INR     Status: Abnormal   Collection Time: 09/23/15  5:39 AM  Result Value Ref Range   Prothrombin Time 17.6 (H) 11.6 - 15.2 seconds   INR 1.44 0.00 - 1.49  Glucose, capillary     Status: Abnormal   Collection Time: 09/23/15  6:25 AM  Result Value Ref Range   Glucose-Capillary 123 (H) 65 - 99 mg/dL  Glucose, capillary     Status: Abnormal   Collection Time: 09/23/15  7:50 AM  Result Value Ref Range   Glucose-Capillary 105 (H) 65 - 99 mg/dL  Glucose, capillary     Status: Abnormal   Collection Time: 09/23/15 11:36 AM  Result Value Ref Range   Glucose-Capillary 101 (H) 65 - 99 mg/dL  CBC     Status: Abnormal   Collection Time: 09/23/15 12:10 PM  Result Value Ref Range   WBC 13.8 (H) 4.0 - 10.5 K/uL   RBC 2.63 (L) 3.87 - 5.11 MIL/uL   Hemoglobin 7.5 (L) 12.0 - 15.0 g/dL   HCT 16.1 (L) 09.6 - 04.5 %   MCV 85.9 78.0 - 100.0 fL   MCH  28.5 26.0 - 34.0 pg   MCHC 33.2 30.0 - 36.0 g/dL   RDW 40.9 81.1 - 91.4 %   Platelets 114 (L) 150 - 400 K/uL  Prepare RBC     Status: None   Collection Time: 09/23/15  1:52 PM  Result Value Ref Range   Order Confirmation ORDER PROCESSED BY BLOOD BANK     Studies/Results: Nm Gi Blood Loss  09/22/2015  CLINICAL DATA:  Active GI bleed. EXAM: NUCLEAR MEDICINE GASTROINTESTINAL BLEEDING SCAN TECHNIQUE: Sequential abdominal images were obtained following intravenous administration of Tc-17m labeled red blood cells. RADIOPHARMACEUTICALS:  25.0 mCi Tc-69m in-vitro labeled red cells. COMPARISON:  None. FINDINGS: There is a tubular focus of increased uptake within the proximal left colon which increases in intensity and exhibits antegrade progression conforming to the expected configuration distal colon. Normal physiologic activity is identified within the liver, GU tract and blood pool. IMPRESSION: 1. Examination is positive for active GI bleed which appears to originate at the level of the splenic flexure. Electronically Signed   By: Signa Kell M.D.   On: 09/22/2015 16:01   Ir Angiogram Visceral Selective  09/23/2015  INDICATION: Gastrointestinal bleeding. Positive nuclear medicine imaging. Active bleeding from the splenic flexure noted. EXAM: SELECTIVE VISCERAL ARTERIOGRAPHY; IR ULTRASOUND GUIDANCE VASC ACCESS RIGHT MEDICATIONS: Ancef 1 g. The antibiotic was administered within 1 hour of the procedure ANESTHESIA/SEDATION: Moderate (conscious) sedation was employed during this procedure. A total of Versed 1.5 mg and Fentanyl 50 mcg was administered intravenously. Moderate Sedation Time: 80 minutes. The patient's level of consciousness and vital signs were monitored continuously by radiology nursing throughout the procedure under my direct supervision. CONTRAST:  ISOVUE-300 IOPAMIDOL (ISOVUE-300) INJECTION 61% FLUOROSCOPY TIME:  Fluoroscopy Time: 26 minutes 48 seconds (210 mGy). COMPLICATIONS: None  immediate. PROCEDURE: Informed consent was obtained from the patient following explanation of the procedure, risks, benefits and alternatives. The patient understands, agrees and consents for the  procedure. All questions were addressed. A time out was performed prior to the initiation of the procedure. Maximal barrier sterile technique utilized including caps, mask, sterile gowns, sterile gloves, large sterile drape, hand hygiene, and Betadine prep. The right groin was prepped and draped in a sterile fashion. 1% lidocaine was utilized for local anesthesia. Under sonographic guidance, a micropuncture needle was inserted into the right common femoral artery and removed over a 018 wire which was up sized to a Bentson. A 5 French sheath was inserted. A micropuncture needle was then inserted into the right common femoral vein and removed over a 018 wire. A 5 French transitional dilator was inserted for venous access. A 5 French pigtail catheter was advanced into the aorta. Lateral aortography was performed demonstrating patency at the origin of the celiac, SMA, and IMA. A cobra catheter was advanced into the SMA and angiography was performed. This showed no source of gastrointestinal bleeding. The Cobra catheter was exchanged over a Bentson wire for a Sos Omni catheter. The IMA was selected and angiography was performed. Active extravasation at the splenic flexure was noted. A micro catheter was then advanced through the Sos Omni catheter and into the marginal artery of [place]. Several contrast injections were performed. Active extravasation was again identified at the same location, however placement of the catheter into the culprit vessel was not possible. The micro catheter was removed. The Sos was advanced into the aorta and straightened with the Bentson wire. Right femoral angiography was performed demonstrating a high profunda femoral artery takeoff. It was then removed. The sheath was removed and hemostasis was  achieved with direct pressure. FINDINGS: Angiography of the SMA demonstrates no source of active bleeding. Selective injection of the IMA demonstrates active contrast extravasation at the splenic flexure. Competitive flow from the middle colicky artery is noted. Several subsequent contrast injections via a micro catheter in the marginal artery of [place] demonstrate active extravasation at the splenic flexure. This occurs at a small arcade vessel from the branch of the marginal artery of [place] at the watershed zone between the middle and left colloid arteries. IMPRESSION: Diagnostic angiogram demonstrates active contrast extravasation from a small arcade vessel of the left common artery at the splenic flexure. Embolization of this small vessel was unsuccessful as described above. At the end of the procedure, the patient's systolic blood pressure remained above 100 mm Hg with a pulse of 100. This was discussed with Dr. Mikey Bussing of the internal medicine teaching service. Electronically Signed   By: Jolaine Click M.D.   On: 09/23/2015 08:25   Ir Angiogram Visceral Selective  09/23/2015  INDICATION: Gastrointestinal bleeding. Positive nuclear medicine imaging. Active bleeding from the splenic flexure noted. EXAM: SELECTIVE VISCERAL ARTERIOGRAPHY; IR ULTRASOUND GUIDANCE VASC ACCESS RIGHT MEDICATIONS: Ancef 1 g. The antibiotic was administered within 1 hour of the procedure ANESTHESIA/SEDATION: Moderate (conscious) sedation was employed during this procedure. A total of Versed 1.5 mg and Fentanyl 50 mcg was administered intravenously. Moderate Sedation Time: 80 minutes. The patient's level of consciousness and vital signs were monitored continuously by radiology nursing throughout the procedure under my direct supervision. CONTRAST:  ISOVUE-300 IOPAMIDOL (ISOVUE-300) INJECTION 61% FLUOROSCOPY TIME:  Fluoroscopy Time: 26 minutes 48 seconds (210 mGy). COMPLICATIONS: None immediate. PROCEDURE: Informed consent was  obtained from the patient following explanation of the procedure, risks, benefits and alternatives. The patient understands, agrees and consents for the procedure. All questions were addressed. A time out was performed prior to the initiation of the procedure. Maximal  barrier sterile technique utilized including caps, mask, sterile gowns, sterile gloves, large sterile drape, hand hygiene, and Betadine prep. The right groin was prepped and draped in a sterile fashion. 1% lidocaine was utilized for local anesthesia. Under sonographic guidance, a micropuncture needle was inserted into the right common femoral artery and removed over a 018 wire which was up sized to a Bentson. A 5 French sheath was inserted. A micropuncture needle was then inserted into the right common femoral vein and removed over a 018 wire. A 5 French transitional dilator was inserted for venous access. A 5 French pigtail catheter was advanced into the aorta. Lateral aortography was performed demonstrating patency at the origin of the celiac, SMA, and IMA. A cobra catheter was advanced into the SMA and angiography was performed. This showed no source of gastrointestinal bleeding. The Cobra catheter was exchanged over a Bentson wire for a Sos Omni catheter. The IMA was selected and angiography was performed. Active extravasation at the splenic flexure was noted. A micro catheter was then advanced through the Sos Omni catheter and into the marginal artery of [place]. Several contrast injections were performed. Active extravasation was again identified at the same location, however placement of the catheter into the culprit vessel was not possible. The micro catheter was removed. The Sos was advanced into the aorta and straightened with the Bentson wire. Right femoral angiography was performed demonstrating a high profunda femoral artery takeoff. It was then removed. The sheath was removed and hemostasis was achieved with direct pressure. FINDINGS:  Angiography of the SMA demonstrates no source of active bleeding. Selective injection of the IMA demonstrates active contrast extravasation at the splenic flexure. Competitive flow from the middle colicky artery is noted. Several subsequent contrast injections via a micro catheter in the marginal artery of [place] demonstrate active extravasation at the splenic flexure. This occurs at a small arcade vessel from the branch of the marginal artery of [place] at the watershed zone between the middle and left colloid arteries. IMPRESSION: Diagnostic angiogram demonstrates active contrast extravasation from a small arcade vessel of the left common artery at the splenic flexure. Embolization of this small vessel was unsuccessful as described above. At the end of the procedure, the patient's systolic blood pressure remained above 100 mm Hg with a pulse of 100. This was discussed with Dr. Mikey BussingHoffman of the internal medicine teaching service. Electronically Signed   By: Jolaine ClickArthur  Hoss M.D.   On: 09/23/2015 08:25   Ir [place] Guide Vasc Access Right  09/23/2015  INDICATION: Gastrointestinal bleeding. Positive nuclear medicine imaging. Active bleeding from the splenic flexure noted. EXAM: SELECTIVE VISCERAL ARTERIOGRAPHY; IR ULTRASOUND GUIDANCE VASC ACCESS RIGHT MEDICATIONS: Ancef 1 g. The antibiotic was administered within 1 hour of the procedure ANESTHESIA/SEDATION: Moderate (conscious) sedation was employed during this procedure. A total of Versed 1.5 mg and Fentanyl 50 mcg was administered intravenously. Moderate Sedation Time: 80 minutes. The patient's level of consciousness and vital signs were monitored continuously by radiology nursing throughout the procedure under my direct supervision. CONTRAST:  100mL ISOVUE-300 IOPAMIDOL (ISOVUE-300) INJECTION 61% FLUOROSCOPY TIME:  Fluoroscopy Time: 26 minutes 48 seconds (210 mGy). COMPLICATIONS: None immediate. PROCEDURE: Informed consent was obtained from the patient following  explanation of the procedure, risks, benefits and alternatives. The patient understands, agrees and consents for the procedure. All questions were addressed. A time out was performed prior to the initiation of the procedure. Maximal barrier sterile technique utilized including caps, mask, sterile gowns, sterile gloves, large sterile drape, hand hygiene,  and Betadine prep. The right groin was prepped and draped in a sterile fashion. 1% lidocaine was utilized for local anesthesia. Under sonographic guidance, a micropuncture needle was inserted into the right common femoral artery and removed over a 018 wire which was up sized to a Bentson. A 5 French sheath was inserted. A micropuncture needle was then inserted into the right common femoral vein and removed over a 018 wire. A 5 French transitional dilator was inserted for venous access. A 5 French pigtail catheter was advanced into the aorta. Lateral aortography was performed demonstrating patency at the origin of the celiac, SMA, and IMA. A cobra catheter was advanced into the SMA and angiography was performed. This showed no source of gastrointestinal bleeding. The Cobra catheter was exchanged over a Bentson wire for a Sos Omni catheter. The IMA was selected and angiography was performed. Active extravasation at the splenic flexure was noted. A micro catheter was then advanced through the Sos Omni catheter and into the marginal artery of [place]. Several contrast injections were performed. Active extravasation was again identified at the same location, however placement of the catheter into the culprit vessel was not possible. The micro catheter was removed. The Sos was advanced into the aorta and straightened with the Bentson wire. Right femoral angiography was performed demonstrating a high profunda femoral artery takeoff. It was then removed. The sheath was removed and hemostasis was achieved with direct pressure. FINDINGS: Angiography of the SMA demonstrates no  source of active bleeding. Selective injection of the IMA demonstrates active contrast extravasation at the splenic flexure. Competitive flow from the middle colicky artery is noted. Several subsequent contrast injections via a micro catheter in the marginal artery of [place] demonstrate active extravasation at the splenic flexure. This occurs at a small arcade vessel from the branch of the marginal artery of [place] at the watershed zone between the middle and left colloid arteries. IMPRESSION: Diagnostic angiogram demonstrates active contrast extravasation from a small arcade vessel of the left common artery at the splenic flexure. Embolization of this small vessel was unsuccessful as described above. At the end of the procedure, the patient's systolic blood pressure remained above 100 mm Hg with a pulse of 100. This was discussed with Dr. Mikey Bussing of the internal medicine teaching service. Electronically Signed   By: Jolaine Click M.D.   On: 09/23/2015 08:25      Assessment: Lower GI bleed located in the region of the splenic flexure.  Plan:   Per my discussion with the medicine team IR did not feel that they could embolize this lesion even if they tried again. Surgery wishes to hold off on surgery at this time and see if things continue to slow down and stabilize. A colonoscopy at this time would be futile since her colon is not prepped and we could not see anything other than stool and blood. I don't think she is in any shape to go through a colon purge at this time. We will continue to follow. Continue to follow H&H. Transfuse blood as needed.    Gwenevere Abbot 09/23/2015, 2:16 PM  Pager: [phone] If no answer or after 5 PM call [phone] Lab Results  Component Value Date   HGB 7.5* 09/23/2015   HGB 8.6* 09/23/2015   HGB 6.7* 09/22/2015   HCT 22.6* 09/23/2015   HCT 26.3* 09/23/2015   HCT 20.7* 09/22/2015

## 2015-09-23 NOTE — Progress Notes (Signed)
Patient ID: Sharon Conway, female   DOB: [birth year], 82 y.o.   MRN: [id number]     [address] [place], [place] [address]    Phone: [phone] FAX: [phone]     Subjective: Diarrhea, no BRB in bag. Vss.    Objective:  Vital signs:  Filed Vitals:   09/23/15 1000 09/23/15 1015 09/23/15 1100 09/23/15 1157  BP: 105/42 105/45 91/80   Pulse: 95 109 111   Temp:    98.9 F (37.2 C)  TempSrc:    Oral  Resp: _0 SpO2: 99% 100% 100%     Last BM Date: 09/23/15  Intake/Output   Yesterday:  05/03 0701 - 05/04 0700 In: 3791.2 [I.V.:875; Blood:1416.2; IV Piggyback:1500] Out: 800 [Stool:800] This shift:  Total I/O In: 225 [I.V.:225] Out: 400 [Urine:150; Stool:250]  Physical Exam: General: Pt awake/alert/oriented x4 in no acute distress Abdomen: Soft.  Nondistended. Non tender.  No evidence of peritonitis.  No incarcerated hernias.    Problem List:   Active Problems:   GI bleeding   Diabetes mellitus type II, controlled ([place])   Symptomatic anemia   Lower GI bleed    Results:   Labs: Results for orders placed or performed during the hospital encounter of 09/22/15 (from the past 48 hour(s))  MRSA PCR Screening     Status: None   Collection Time: 09/22/15  9:58 AM  Result Value Ref Range   MRSA by PCR NEGATIVE NEGATIVE    Comment:        The GeneXpert MRSA Assay (FDA approved for NASAL specimens only), is one component of a comprehensive MRSA colonization surveillance program. It is not intended to diagnose MRSA infection nor to guide or monitor treatment for MRSA infections.   Type and screen     Status: None (Preliminary result)   Collection Time: 09/22/15 10:31 AM  Result Value Ref Range   ABO/RH(D) O POS    Antibody Screen NEG    Sample Expiration 09/25/2015    Unit Number [id number]    Blood Component Type RBC LR PHER2    Unit division 00    Status of Unit ISSUED,FINAL     Transfusion Status OK TO TRANSFUSE    Crossmatch Result Compatible    Unit Number [id number]    Blood Component Type RED CELLS,LR    Unit division 00    Status of Unit ISSUED    Transfusion Status OK TO TRANSFUSE    Crossmatch Result Compatible    Unit Number [id number]    Blood Component Type RED CELLS,LR    Unit division 00    Status of Unit ISSUED,FINAL    Transfusion Status OK TO TRANSFUSE    Crossmatch Result Compatible   ABO/Rh     Status: None   Collection Time: 09/22/15 10:31 AM  Result Value Ref Range   ABO/RH(D) O POS   Basic metabolic panel     Status: Abnormal   Collection Time: 09/22/15 10:48 AM  Result Value Ref Range   Sodium 137 135 - 145 mmol/L   Potassium 5.2 (H) 3.5 - 5.1 mmol/L   Chloride 104 101 - 111 mmol/L   CO2 22 22 - 32 mmol/L   Glucose, Bld 148 (H) 65 - 99 mg/dL   BUN 25 (H) 6 - 20 mg/dL   Creatinine, Ser 1.05 (H) 0.44 - 1.00 mg/dL   Calcium 9.6 8.9 - 10.3 mg/dL  GFR calc non Af Amer 44 (L) >60 mL/min   GFR calc Af Amer 51 (L) >60 mL/min    Comment: (NOTE) The eGFR has been calculated using the CKD EPI equation. This calculation has not been validated in all clinical situations. eGFR's persistently <60 mL/min signify possible Chronic Kidney Disease.    Anion gap 11 5 - 15  CBC with Differential     Status: Abnormal   Collection Time: 09/22/15 10:48 AM  Result Value Ref Range   WBC 10.2 4.0 - 10.5 K/uL   RBC 3.39 (L) 3.87 - 5.11 MIL/uL   Hemoglobin 8.9 (L) 12.0 - 15.0 g/dL   HCT 29.4 (L) 36.0 - 46.0 %   MCV 86.7 78.0 - 100.0 fL   MCH 26.3 26.0 - 34.0 pg   MCHC 30.3 30.0 - 36.0 g/dL   RDW 12.9 11.5 - 15.5 %   Platelets 232 150 - 400 K/uL   Neutrophils Relative % 85 %   Neutro Abs 8.7 (H) 1.7 - 7.7 K/uL   Lymphocytes Relative 11 %   Lymphs Abs 1.1 0.7 - 4.0 K/uL   Monocytes Relative 4 %   Monocytes Absolute 0.4 0.1 - 1.0 K/uL   Eosinophils Relative 0 %   Eosinophils Absolute 0.0 0.0 - 0.7 K/uL   Basophils Relative 0 %    Basophils Absolute 0.0 0.0 - 0.1 K/uL  CBG monitoring, ED     Status: None   Collection Time: 09/22/15  2:06 PM  Result Value Ref Range   Glucose-Capillary 74 65 - 99 mg/dL  Protime-INR     Status: None   Collection Time: 09/22/15  4:45 PM  Result Value Ref Range   Prothrombin Time 14.3 11.6 - 15.2 seconds   INR 1.09 0.00 - 1.49  Prepare RBC     Status: None   Collection Time: 09/22/15  5:20 PM  Result Value Ref Range   Order Confirmation ORDER PROCESSED BY BLOOD BANK   Glucose, capillary     Status: Abnormal   Collection Time: 09/22/15  8:41 PM  Result Value Ref Range   Glucose-Capillary 111 (H) 65 - 99 mg/dL  CBC     Status: Abnormal   Collection Time: 09/22/15 10:10 PM  Result Value Ref Range   WBC 13.0 (H) 4.0 - 10.5 K/uL   RBC 2.39 (L) 3.87 - 5.11 MIL/uL   Hemoglobin 6.7 (LL) 12.0 - 15.0 g/dL    Comment: REPEATED TO VERIFY CRITICAL RESULT CALLED TO, READ BACK BY AND VERIFIED WITH: Governor Rooks RN [phone] R    HCT 20.7 (L) 36.0 - 46.0 %   MCV 86.6 78.0 - 100.0 fL   MCH 28.0 26.0 - 34.0 pg   MCHC 32.4 30.0 - 36.0 g/dL   RDW 13.5 11.5 - 15.5 %   Platelets 174 150 - 400 K/uL  Prepare RBC     Status: None   Collection Time: 09/22/15 10:25 PM  Result Value Ref Range   Order Confirmation ORDER PROCESSED BY BLOOD BANK   Glucose, capillary     Status: Abnormal   Collection Time: 09/22/15 11:27 PM  Result Value Ref Range   Glucose-Capillary 131 (H) 65 - 99 mg/dL  CBC     Status: Abnormal   Collection Time: 09/23/15  5:39 AM  Result Value Ref Range   WBC 16.3 (H) 4.0 - 10.5 K/uL   RBC 2.96 (L) 3.87 - 5.11 MIL/uL   Hemoglobin 8.6 (L) 12.0 - 15.0 g/dL  Comment: REPEATED TO VERIFY POST TRANSFUSION SPECIMEN    HCT 26.3 (L) 36.0 - 46.0 %   MCV 88.9 78.0 - 100.0 fL   MCH 29.1 26.0 - 34.0 pg   MCHC 32.7 30.0 - 36.0 g/dL   RDW 13.8 11.5 - 15.5 %   Platelets 124 (L) 150 - 400 K/uL  Basic metabolic panel     Status: Abnormal   Collection Time: 09/23/15  5:39 AM   Result Value Ref Range   Sodium 141 135 - 145 mmol/L   Potassium 5.0 3.5 - 5.1 mmol/L   Chloride 116 (H) 101 - 111 mmol/L   CO2 19 (L) 22 - 32 mmol/L   Glucose, Bld 128 (H) 65 - 99 mg/dL   BUN 27 (H) 6 - 20 mg/dL   Creatinine, Ser 0.90 0.44 - 1.00 mg/dL   Calcium 6.9 (L) 8.9 - 10.3 mg/dL    Comment: REPEATED TO VERIFY   GFR calc non Af Amer 53 (L) >60 mL/min   GFR calc Af Amer >60 >60 mL/min    Comment: (NOTE) The eGFR has been calculated using the CKD EPI equation. This calculation has not been validated in all clinical situations. eGFR's persistently <60 mL/min signify possible Chronic Kidney Disease.    Anion gap 6 5 - 15  APTT     Status: None   Collection Time: 09/23/15  5:39 AM  Result Value Ref Range   aPTT 26 24 - 37 seconds  Protime-INR     Status: Abnormal   Collection Time: 09/23/15  5:39 AM  Result Value Ref Range   Prothrombin Time 17.6 (H) 11.6 - 15.2 seconds   INR 1.44 0.00 - 1.49  Glucose, capillary     Status: Abnormal   Collection Time: 09/23/15  6:25 AM  Result Value Ref Range   Glucose-Capillary 123 (H) 65 - 99 mg/dL  Glucose, capillary     Status: Abnormal   Collection Time: 09/23/15  7:50 AM  Result Value Ref Range   Glucose-Capillary 105 (H) 65 - 99 mg/dL  Glucose, capillary     Status: Abnormal   Collection Time: 09/23/15 11:36 AM  Result Value Ref Range   Glucose-Capillary 101 (H) 65 - 99 mg/dL    Imaging / Studies: Nm Gi Blood Loss  09/22/2015  CLINICAL DATA:  Active GI bleed. EXAM: NUCLEAR MEDICINE GASTROINTESTINAL BLEEDING SCAN TECHNIQUE: Sequential abdominal images were obtained following intravenous administration of Tc-2mlabeled red blood cells. RADIOPHARMACEUTICALS:  25.0 mCi Tc-971mn-vitro labeled red cells. COMPARISON:  None. FINDINGS: There is a tubular focus of increased uptake within the proximal left colon which increases in intensity and exhibits antegrade progression conforming to the expected configuration distal colon. Normal  physiologic activity is identified within the liver, GU tract and blood pool. IMPRESSION: 1. Examination is positive for active GI bleed which appears to originate at the level of the splenic flexure. Electronically Signed   By: TaKerby Moors.D.   On: 09/22/2015 16:01   Ir Angiogram Visceral Selective  09/23/2015  INDICATION: Gastrointestinal bleeding. Positive nuclear medicine imaging. Active bleeding from the splenic flexure noted. EXAM: SELECTIVE VISCERAL ARTERIOGRAPHY; IR ULTRASOUND GUIDANCE VASC ACCESS RIGHT MEDICATIONS: Ancef 1 g. The antibiotic was administered within 1 hour of the procedure ANESTHESIA/SEDATION: Moderate (conscious) sedation was employed during this procedure. A total of Versed 1.5 mg and Fentanyl 50 mcg was administered intravenously. Moderate Sedation Time: 80 minutes. The patient's level of consciousness and vital signs were monitored continuously by radiology nursing throughout  the procedure under my direct supervision. CONTRAST:  119m ISOVUE-300 IOPAMIDOL (ISOVUE-300) INJECTION 61% FLUOROSCOPY TIME:  Fluoroscopy Time: 26 minutes 48 seconds (210 mGy). COMPLICATIONS: None immediate. PROCEDURE: Informed consent was obtained from the patient following explanation of the procedure, risks, benefits and alternatives. The patient understands, agrees and consents for the procedure. All questions were addressed. A time out was performed prior to the initiation of the procedure. Maximal barrier sterile technique utilized including caps, mask, sterile gowns, sterile gloves, large sterile drape, hand hygiene, and Betadine prep. The right groin was prepped and draped in a sterile fashion. 1% lidocaine was utilized for local anesthesia. Under sonographic guidance, a micropuncture needle was inserted into the right common femoral artery and removed over a 018 wire which was up sized to a Bentson. A 5 French sheath was inserted. A micropuncture needle was then inserted into the right common femoral  vein and removed over a 018 wire. A 5 French transitional dilator was inserted for venous access. A 5 French pigtail catheter was advanced into the aorta. Lateral aortography was performed demonstrating patency at the origin of the celiac, SMA, and IMA. A cobra catheter was advanced into the SMA and angiography was performed. This showed no source of gastrointestinal bleeding. The Cobra catheter was exchanged over a Bentson wire for a Sos Omni catheter. The IMA was selected and angiography was performed. Active extravasation at the splenic flexure was noted. A micro catheter was then advanced through the SOwatonnacatheter and into the marginal artery of [place]. Several contrast injections were performed. Active extravasation was again identified at the same location, however placement of the catheter into the culprit vessel was not possible. The micro catheter was removed. The Sos was advanced into the aorta and straightened with the Bentson wire. Right femoral angiography was performed demonstrating a high profunda femoral artery takeoff. It was then removed. The sheath was removed and hemostasis was achieved with direct pressure. FINDINGS: Angiography of the SMA demonstrates no source of active bleeding. Selective injection of the IMA demonstrates active contrast extravasation at the splenic flexure. Competitive flow from the middle colicky artery is noted. Several subsequent contrast injections via a micro catheter in the marginal artery of [place] demonstrate active extravasation at the splenic flexure. This occurs at a small arcade vessel from the branch of the marginal artery of [place] at the watershed zone between the middle and left colloid arteries. IMPRESSION: Diagnostic angiogram demonstrates active contrast extravasation from a small arcade vessel of the left common artery at the splenic flexure. Embolization of this small vessel was unsuccessful as described above. At the end of the procedure, the  patient's systolic blood pressure remained above 100 mm Hg with a pulse of 100. This was discussed with Dr. HHeber [place]of the internal medicine teaching service. Electronically Signed   By: AMarybelle KillingsM.D.   On: 09/23/2015 08:25   Ir Angiogram Visceral Selective  09/23/2015  INDICATION: Gastrointestinal bleeding. Positive nuclear medicine imaging. Active bleeding from the splenic flexure noted. EXAM: SELECTIVE VISCERAL ARTERIOGRAPHY; IR ULTRASOUND GUIDANCE VASC ACCESS RIGHT MEDICATIONS: Ancef 1 g. The antibiotic was administered within 1 hour of the procedure ANESTHESIA/SEDATION: Moderate (conscious) sedation was employed during this procedure. A total of Versed 1.5 mg and Fentanyl 50 mcg was administered intravenously. Moderate Sedation Time: 80 minutes. The patient's level of consciousness and vital signs were monitored continuously by radiology nursing throughout the procedure under my direct supervision. CONTRAST:  1047mISOVUE-300 IOPAMIDOL (ISOVUE-300) INJECTION 61% FLUOROSCOPY TIME:  Fluoroscopy  Time: 26 minutes 48 seconds (210 mGy). COMPLICATIONS: None immediate. PROCEDURE: Informed consent was obtained from the patient following explanation of the procedure, risks, benefits and alternatives. The patient understands, agrees and consents for the procedure. All questions were addressed. A time out was performed prior to the initiation of the procedure. Maximal barrier sterile technique utilized including caps, mask, sterile gowns, sterile gloves, large sterile drape, hand hygiene, and Betadine prep. The right groin was prepped and draped in a sterile fashion. 1% lidocaine was utilized for local anesthesia. Under sonographic guidance, a micropuncture needle was inserted into the right common femoral artery and removed over a 018 wire which was up sized to a Bentson. A 5 French sheath was inserted. A micropuncture needle was then inserted into the right common femoral vein and removed over a 018 wire. A 5  French transitional dilator was inserted for venous access. A 5 French pigtail catheter was advanced into the aorta. Lateral aortography was performed demonstrating patency at the origin of the celiac, SMA, and IMA. A cobra catheter was advanced into the SMA and angiography was performed. This showed no source of gastrointestinal bleeding. The Cobra catheter was exchanged over a Bentson wire for a Sos Omni catheter. The IMA was selected and angiography was performed. Active extravasation at the splenic flexure was noted. A micro catheter was then advanced through the Baskin catheter and into the marginal artery of [place]. Several contrast injections were performed. Active extravasation was again identified at the same location, however placement of the catheter into the culprit vessel was not possible. The micro catheter was removed. The Sos was advanced into the aorta and straightened with the Bentson wire. Right femoral angiography was performed demonstrating a high profunda femoral artery takeoff. It was then removed. The sheath was removed and hemostasis was achieved with direct pressure. FINDINGS: Angiography of the SMA demonstrates no source of active bleeding. Selective injection of the IMA demonstrates active contrast extravasation at the splenic flexure. Competitive flow from the middle colicky artery is noted. Several subsequent contrast injections via a micro catheter in the marginal artery of [place] demonstrate active extravasation at the splenic flexure. This occurs at a small arcade vessel from the branch of the marginal artery of [place] at the watershed zone between the middle and left colloid arteries. IMPRESSION: Diagnostic angiogram demonstrates active contrast extravasation from a small arcade vessel of the left common artery at the splenic flexure. Embolization of this small vessel was unsuccessful as described above. At the end of the procedure, the patient's systolic blood pressure  remained above 100 mm Hg with a pulse of 100. This was discussed with Dr. Heber [place] of the internal medicine teaching service. Electronically Signed   By: Marybelle Killings M.D.   On: 09/23/2015 08:25   Ir US Guide Vasc Access Right  09/23/2015  INDICATION: Gastrointestinal bleeding. Positive nuclear medicine imaging. Active bleeding from the splenic flexure noted. EXAM: SELECTIVE VISCERAL ARTERIOGRAPHY; IR ULTRASOUND GUIDANCE VASC ACCESS RIGHT MEDICATIONS: Ancef 1 g. The antibiotic was administered within 1 hour of the procedure ANESTHESIA/SEDATION: Moderate (conscious) sedation was employed during this procedure. A total of Versed 1.5 mg and Fentanyl 50 mcg was administered intravenously. Moderate Sedation Time: 80 minutes. The patient's level of consciousness and vital signs were monitored continuously by radiology nursing throughout the procedure under my direct supervision. CONTRAST:  176m ISOVUE-300 IOPAMIDOL (ISOVUE-300) INJECTION 61% FLUOROSCOPY TIME:  Fluoroscopy Time: 26 minutes 48 seconds (210 mGy). COMPLICATIONS: None immediate. PROCEDURE: Informed consent was obtained from  the patient following explanation of the procedure, risks, benefits and alternatives. The patient understands, agrees and consents for the procedure. All questions were addressed. A time out was performed prior to the initiation of the procedure. Maximal barrier sterile technique utilized including caps, mask, sterile gowns, sterile gloves, large sterile drape, hand hygiene, and Betadine prep. The right groin was prepped and draped in a sterile fashion. 1% lidocaine was utilized for local anesthesia. Under sonographic guidance, a micropuncture needle was inserted into the right common femoral artery and removed over a 018 wire which was up sized to a Bentson. A 5 French sheath was inserted. A micropuncture needle was then inserted into the right common femoral vein and removed over a 018 wire. A 5 French transitional dilator was inserted  for venous access. A 5 French pigtail catheter was advanced into the aorta. Lateral aortography was performed demonstrating patency at the origin of the celiac, SMA, and IMA. A cobra catheter was advanced into the SMA and angiography was performed. This showed no source of gastrointestinal bleeding. The Cobra catheter was exchanged over a Bentson wire for a Sos Omni catheter. The IMA was selected and angiography was performed. Active extravasation at the splenic flexure was noted. A micro catheter was then advanced through the Brule catheter and into the marginal artery of [place]. Several contrast injections were performed. Active extravasation was again identified at the same location, however placement of the catheter into the culprit vessel was not possible. The micro catheter was removed. The Sos was advanced into the aorta and straightened with the Bentson wire. Right femoral angiography was performed demonstrating a high profunda femoral artery takeoff. It was then removed. The sheath was removed and hemostasis was achieved with direct pressure. FINDINGS: Angiography of the SMA demonstrates no source of active bleeding. Selective injection of the IMA demonstrates active contrast extravasation at the splenic flexure. Competitive flow from the middle colicky artery is noted. Several subsequent contrast injections via a micro catheter in the marginal artery of [place] demonstrate active extravasation at the splenic flexure. This occurs at a small arcade vessel from the branch of the marginal artery of [place] at the watershed zone between the middle and left colloid arteries. IMPRESSION: Diagnostic angiogram demonstrates active contrast extravasation from a small arcade vessel of the left common artery at the splenic flexure. Embolization of this small vessel was unsuccessful as described above. At the end of the procedure, the patient's systolic blood pressure remained above 100 mm Hg with a pulse of 100.  This was discussed with Dr. Heber [place] of the internal medicine teaching service. Electronically Signed   By: Marybelle Killings M.D.   On: 09/23/2015 08:25   Dg Chest Portable 1 View  09/22/2015  CLINICAL DATA:  Shortness of breath on exertion with occasional cough. EXAM: PORTABLE CHEST 1 VIEW COMPARISON:  None. FINDINGS: Cardiomegaly. Aortic atherosclerosis. Hyperinflation. No active infiltrates or failure. No effusion or pneumothorax. Osteopenia. IMPRESSION: Cardiomegaly.  No active infiltrates or failure. Electronically Signed   By: Staci Righter M.D.   On: 09/22/2015 11:52    Medications / Allergies:  Scheduled Meds: . sodium chloride   Intravenous Once  . sodium chloride   Intravenous STAT  . sodium chloride   Intravenous Once  . sodium chloride   Intravenous Once  . insulin aspart  0-9 Units Subcutaneous Q4H  . pantoprazole (PROTONIX) IV  40 mg Intravenous BID   Continuous Infusions:  PRN Meds:.  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  Therefore recommend colonoscopy if she rebleeds.  If she becomes unstable, then will proceed with a partial colectomy.   Repeat cbc pending.  If stable, can give clears.   Erby Pian, [facility] Surgery Pager [phone]) For consults and floor pages call [phone]([id number])  09/23/2015 12:13 PM

## 2015-09-23 NOTE — Progress Notes (Signed)
Post transfusion CBC ordered. Will continue to monitor.

## 2015-09-23 NOTE — Progress Notes (Addendum)
RN called IR regarding line in R groin not currently attached to any fluids. IR PA to see patient. Will continue to monitor.   Update: removed by RN per Garald BaldingKevin IR PA. Venous line.

## 2015-09-23 NOTE — Progress Notes (Signed)
Subjective: Sharon Conway was seen and evaluated on rounds this morning.  She has no complaints at this time and says she had no pain overnight.  She continues to put out blood from rectal tube.  Denies abdominal pain, SOB, CP, nausea, vomiting, light headedness, diaphoresis.  Overnight, she underwent unsuccessful VIR embolization of colonic bleed.  During the procedure she was hypotensive and transferred to stepdown unit following procedure.  She received 1 unit of blood at 8pm and an additional 2 units of blood in the early morning hours after CBC showed Hb drop to 6.7 (down from 8.9 earlier in the day).      Objective: Vital signs in last 24 hours: Filed Vitals:   09/23/15 0915 09/23/15 0930 09/23/15 1000 09/23/15 1015  BP: 160/72 90/45 105/42 105/45  Pulse: 119 99 95 109  Temp:      TempSrc:      Resp: _0 SpO2: 100% 99% 99% 100%   Weight change:   Intake/Output Summary (Last 24 hours) at 09/23/15 1108 Last data filed at 09/23/15 1057  Gross per 24 hour  Intake 4016.16 ml  Output   1200 ml  Net 2816.16 ml   General: pleasant, alert and oriented x3 Lungs: CTAB.  Normal respiratory effort.  Heart: RRR. 2+ radial pulses  Abdomen:  Soft, some guarding to palpation.  Nontender.  Normoactive bowel sounds.  Femoral catheter on R side.  Midline scar near umbilicus from prior hysterectomy.  Extremities: No edema.   Psych: normal mood and affect  Lab Results: Recent Results (from the past 2160 hour(s))  MRSA PCR Screening     Status: None   Collection Time: 09/22/15  9:58 AM  Result Value Ref Range   MRSA by PCR NEGATIVE NEGATIVE    Comment:        The GeneXpert MRSA Assay (FDA approved for NASAL specimens only), is one component of a comprehensive MRSA colonization surveillance program. It is not intended to diagnose MRSA infection nor to guide or monitor treatment for MRSA infections.   Type and screen     Status: None (Preliminary result)   Collection Time:  09/22/15 10:31 AM  Result Value Ref Range   ABO/RH(D) O POS    Antibody Screen NEG    Sample Expiration 09/25/2015    Unit Number [id number]    Blood Component Type RBC LR PHER2    Unit division 00    Status of Unit ISSUED,FINAL    Transfusion Status OK TO TRANSFUSE    Crossmatch Result Compatible    Unit Number [id number]    Blood Component Type RED CELLS,LR    Unit division 00    Status of Unit ISSUED    Transfusion Status OK TO TRANSFUSE    Crossmatch Result Compatible    Unit Number [id number]    Blood Component Type RED CELLS,LR    Unit division 00    Status of Unit ISSUED,FINAL    Transfusion Status OK TO TRANSFUSE    Crossmatch Result Compatible   ABO/Rh     Status: None   Collection Time: 09/22/15 10:31 AM  Result Value Ref Range   ABO/RH(D) O POS   Basic metabolic panel     Status: Abnormal   Collection Time: 09/22/15 10:48 AM  Result Value Ref Range   Sodium 137 135 - 145 mmol/L   Potassium 5.2 (H) 3.5 - 5.1 mmol/L   Chloride 104 101 - 111 mmol/L   CO2 22 22 - 32  mmol/L   Glucose, Bld 148 (H) 65 - 99 mg/dL   BUN 25 (H) 6 - 20 mg/dL   Creatinine, Ser 1.05 (H) 0.44 - 1.00 mg/dL   Calcium 9.6 8.9 - 10.3 mg/dL   GFR calc non Af Amer 44 (L) >60 mL/min   GFR calc Af Amer 51 (L) >60 mL/min    Comment: (NOTE) The eGFR has been calculated using the CKD EPI equation. This calculation has not been validated in all clinical situations. eGFR's persistently <60 mL/min signify possible Chronic Kidney Disease.    Anion gap 11 5 - 15  CBC with Differential     Status: Abnormal   Collection Time: 09/22/15 10:48 AM  Result Value Ref Range   WBC 10.2 4.0 - 10.5 K/uL   RBC 3.39 (L) 3.87 - 5.11 MIL/uL   Hemoglobin 8.9 (L) 12.0 - 15.0 g/dL   HCT 29.4 (L) 36.0 - 46.0 %   MCV 86.7 78.0 - 100.0 fL   MCH 26.3 26.0 - 34.0 pg   MCHC 30.3 30.0 - 36.0 g/dL   RDW 12.9 11.5 - 15.5 %   Platelets 232 150 - 400 K/uL   Neutrophils Relative % 85 %   Neutro Abs 8.7 (H) 1.7 -  7.7 K/uL   Lymphocytes Relative 11 %   Lymphs Abs 1.1 0.7 - 4.0 K/uL   Monocytes Relative 4 %   Monocytes Absolute 0.4 0.1 - 1.0 K/uL   Eosinophils Relative 0 %   Eosinophils Absolute 0.0 0.0 - 0.7 K/uL   Basophils Relative 0 %   Basophils Absolute 0.0 0.0 - 0.1 K/uL  CBG monitoring, ED     Status: None   Collection Time: 09/22/15  2:06 PM  Result Value Ref Range   Glucose-Capillary 74 65 - 99 mg/dL  Protime-INR     Status: None   Collection Time: 09/22/15  4:45 PM  Result Value Ref Range   Prothrombin Time 14.3 11.6 - 15.2 seconds   INR 1.09 0.00 - 1.49  Prepare RBC     Status: None   Collection Time: 09/22/15  5:20 PM  Result Value Ref Range   Order Confirmation ORDER PROCESSED BY BLOOD BANK   Glucose, capillary     Status: Abnormal   Collection Time: 09/22/15  8:41 PM  Result Value Ref Range   Glucose-Capillary 111 (H) 65 - 99 mg/dL  CBC     Status: Abnormal   Collection Time: 09/22/15 10:10 PM  Result Value Ref Range   WBC 13.0 (H) 4.0 - 10.5 K/uL   RBC 2.39 (L) 3.87 - 5.11 MIL/uL   Hemoglobin 6.7 (LL) 12.0 - 15.0 g/dL    Comment: REPEATED TO VERIFY CRITICAL RESULT CALLED TO, READ BACK BY AND VERIFIED WITH: Governor Rooks RN [phone] GREEN R    HCT 20.7 (L) 36.0 - 46.0 %   MCV 86.6 78.0 - 100.0 fL   MCH 28.0 26.0 - 34.0 pg   MCHC 32.4 30.0 - 36.0 g/dL   RDW 13.5 11.5 - 15.5 %   Platelets 174 150 - 400 K/uL  Prepare RBC     Status: None   Collection Time: 09/22/15 10:25 PM  Result Value Ref Range   Order Confirmation ORDER PROCESSED BY BLOOD BANK   Glucose, capillary     Status: Abnormal   Collection Time: 09/22/15 11:27 PM  Result Value Ref Range   Glucose-Capillary 131 (H) 65 - 99 mg/dL  CBC     Status: Abnormal  Collection Time: 09/23/15  5:39 AM  Result Value Ref Range   WBC 16.3 (H) 4.0 - 10.5 K/uL   RBC 2.96 (L) 3.87 - 5.11 MIL/uL   Hemoglobin 8.6 (L) 12.0 - 15.0 g/dL    Comment: REPEATED TO VERIFY POST TRANSFUSION SPECIMEN    HCT 26.3 (L) 36.0 -  46.0 %   MCV 88.9 78.0 - 100.0 fL   MCH 29.1 26.0 - 34.0 pg   MCHC 32.7 30.0 - 36.0 g/dL   RDW 13.8 11.5 - 15.5 %   Platelets 124 (L) 150 - 400 K/uL  Basic metabolic panel     Status: Abnormal   Collection Time: 09/23/15  5:39 AM  Result Value Ref Range   Sodium 141 135 - 145 mmol/L   Potassium 5.0 3.5 - 5.1 mmol/L   Chloride 116 (H) 101 - 111 mmol/L   CO2 19 (L) 22 - 32 mmol/L   Glucose, Bld 128 (H) 65 - 99 mg/dL   BUN 27 (H) 6 - 20 mg/dL   Creatinine, Ser 0.90 0.44 - 1.00 mg/dL   Calcium 6.9 (L) 8.9 - 10.3 mg/dL    Comment: REPEATED TO VERIFY   GFR calc non Af Amer 53 (L) >60 mL/min   GFR calc Af Amer >60 >60 mL/min    Comment: (NOTE) The eGFR has been calculated using the CKD EPI equation. This calculation has not been validated in all clinical situations. eGFR's persistently <60 mL/min signify possible Chronic Kidney Disease.    Anion gap 6 5 - 15  APTT     Status: None   Collection Time: 09/23/15  5:39 AM  Result Value Ref Range   aPTT 26 24 - 37 seconds  Protime-INR     Status: Abnormal   Collection Time: 09/23/15  5:39 AM  Result Value Ref Range   Prothrombin Time 17.6 (H) 11.6 - 15.2 seconds   INR 1.44 0.00 - 1.49  Glucose, capillary     Status: Abnormal   Collection Time: 09/23/15  6:25 AM  Result Value Ref Range   Glucose-Capillary 123 (H) 65 - 99 mg/dL  Glucose, capillary     Status: Abnormal   Collection Time: 09/23/15  7:50 AM  Result Value Ref Range   Glucose-Capillary 105 (H) 65 - 99 mg/dL    Micro Results: Recent Results (from the past 240 hour(s))  MRSA PCR Screening     Status: None   Collection Time: 09/22/15  9:58 AM  Result Value Ref Range Status   MRSA by PCR NEGATIVE NEGATIVE Final    Comment:        The GeneXpert MRSA Assay (FDA approved for NASAL specimens only), is one component of a comprehensive MRSA colonization surveillance program. It is not intended to diagnose MRSA infection nor to guide or monitor treatment for MRSA  infections.    Studies/Results: Nm Gi Blood Loss  09/22/2015  CLINICAL DATA:  Active GI bleed. EXAM: NUCLEAR MEDICINE GASTROINTESTINAL BLEEDING SCAN TECHNIQUE: Sequential abdominal images were obtained following intravenous administration of Tc-17mlabeled red blood cells. RADIOPHARMACEUTICALS:  25.0 mCi Tc-971mn-vitro labeled red cells. COMPARISON:  None. FINDINGS: There is a tubular focus of increased uptake within the proximal left colon which increases in intensity and exhibits antegrade progression conforming to the expected configuration distal colon. Normal physiologic activity is identified within the liver, GU tract and blood pool. IMPRESSION: 1. Examination is positive for active GI bleed which appears to originate at the level of the splenic flexure. Electronically Signed  By: Kerby Moors M.D.   On: 09/22/2015 16:01   Ir Angiogram Visceral Selective  09/23/2015  INDICATION: Gastrointestinal bleeding. Positive nuclear medicine imaging. Active bleeding from the splenic flexure noted. EXAM: SELECTIVE VISCERAL ARTERIOGRAPHY; IR ULTRASOUND GUIDANCE VASC ACCESS RIGHT MEDICATIONS: Ancef 1 g. The antibiotic was administered within 1 hour of the procedure ANESTHESIA/SEDATION: Moderate (conscious) sedation was employed during this procedure. A total of Versed 1.5 mg and Fentanyl 50 mcg was administered intravenously. Moderate Sedation Time: 80 minutes. The patient's level of consciousness and vital signs were monitored continuously by radiology nursing throughout the procedure under my direct supervision. CONTRAST:  150m ISOVUE-300 IOPAMIDOL (ISOVUE-300) INJECTION 61% FLUOROSCOPY TIME:  Fluoroscopy Time: 26 minutes 48 seconds (210 mGy). COMPLICATIONS: None immediate. PROCEDURE: Informed consent was obtained from the patient following explanation of the procedure, risks, benefits and alternatives. The patient understands, agrees and consents for the procedure. All questions were addressed. A time out was  performed prior to the initiation of the procedure. Maximal barrier sterile technique utilized including caps, mask, sterile gowns, sterile gloves, large sterile drape, hand hygiene, and Betadine prep. The right groin was prepped and draped in a sterile fashion. 1% lidocaine was utilized for local anesthesia. Under sonographic guidance, a micropuncture needle was inserted into the right common femoral artery and removed over a 018 wire which was up sized to a Bentson. A 5 French sheath was inserted. A micropuncture needle was then inserted into the right common femoral vein and removed over a 018 wire. A 5 French transitional dilator was inserted for venous access. A 5 French pigtail catheter was advanced into the aorta. Lateral aortography was performed demonstrating patency at the origin of the celiac, SMA, and IMA. A cobra catheter was advanced into the SMA and angiography was performed. This showed no source of gastrointestinal bleeding. The Cobra catheter was exchanged over a Bentson wire for a Sos Omni catheter. The IMA was selected and angiography was performed. Active extravasation at the splenic flexure was noted. A micro catheter was then advanced through the SCrookcatheter and into the marginal artery of [place]. Several contrast injections were performed. Active extravasation was again identified at the same location, however placement of the catheter into the culprit vessel was not possible. The micro catheter was removed. The Sos was advanced into the aorta and straightened with the Bentson wire. Right femoral angiography was performed demonstrating a high profunda femoral artery takeoff. It was then removed. The sheath was removed and hemostasis was achieved with direct pressure. FINDINGS: Angiography of the SMA demonstrates no source of active bleeding. Selective injection of the IMA demonstrates active contrast extravasation at the splenic flexure. Competitive flow from the middle colicky artery  is noted. Several subsequent contrast injections via a micro catheter in the marginal artery of [place] demonstrate active extravasation at the splenic flexure. This occurs at a small arcade vessel from the branch of the marginal artery of [place] at the watershed zone between the middle and left colloid arteries. IMPRESSION: Diagnostic angiogram demonstrates active contrast extravasation from a small arcade vessel of the left common artery at the splenic flexure. Embolization of this small vessel was unsuccessful as described above. At the end of the procedure, the patient's systolic blood pressure remained above 100 mm Hg with a pulse of 100. This was discussed with Dr. HHeber Carolinaof the internal medicine teaching service. Electronically Signed   By: AMarybelle KillingsM.D.   On: 09/23/2015 08:25   Ir Angiogram Visceral Selective  09/23/2015  INDICATION: Gastrointestinal bleeding. Positive nuclear medicine imaging. Active bleeding from the splenic flexure noted. EXAM: SELECTIVE VISCERAL ARTERIOGRAPHY; IR ULTRASOUND GUIDANCE VASC ACCESS RIGHT MEDICATIONS: Ancef 1 g. The antibiotic was administered within 1 hour of the procedure ANESTHESIA/SEDATION: Moderate (conscious) sedation was employed during this procedure. A total of Versed 1.5 mg and Fentanyl 50 mcg was administered intravenously. Moderate Sedation Time: 80 minutes. The patient's level of consciousness and vital signs were monitored continuously by radiology nursing throughout the procedure under my direct supervision. CONTRAST:  146m ISOVUE-300 IOPAMIDOL (ISOVUE-300) INJECTION 61% FLUOROSCOPY TIME:  Fluoroscopy Time: 26 minutes 48 seconds (210 mGy). COMPLICATIONS: None immediate. PROCEDURE: Informed consent was obtained from the patient following explanation of the procedure, risks, benefits and alternatives. The patient understands, agrees and consents for the procedure. All questions were addressed. A time out was performed prior to the initiation of the  procedure. Maximal barrier sterile technique utilized including caps, mask, sterile gowns, sterile gloves, large sterile drape, hand hygiene, and Betadine prep. The right groin was prepped and draped in a sterile fashion. 1% lidocaine was utilized for local anesthesia. Under sonographic guidance, a micropuncture needle was inserted into the right common femoral artery and removed over a 018 wire which was up sized to a Bentson. A 5 French sheath was inserted. A micropuncture needle was then inserted into the right common femoral vein and removed over a 018 wire. A 5 French transitional dilator was inserted for venous access. A 5 French pigtail catheter was advanced into the aorta. Lateral aortography was performed demonstrating patency at the origin of the celiac, SMA, and IMA. A cobra catheter was advanced into the SMA and angiography was performed. This showed no source of gastrointestinal bleeding. The Cobra catheter was exchanged over a Bentson wire for a Sos Omni catheter. The IMA was selected and angiography was performed. Active extravasation at the splenic flexure was noted. A micro catheter was then advanced through the SCalmarcatheter and into the marginal artery of [place]. Several contrast injections were performed. Active extravasation was again identified at the same location, however placement of the catheter into the culprit vessel was not possible. The micro catheter was removed. The Sos was advanced into the aorta and straightened with the Bentson wire. Right femoral angiography was performed demonstrating a high profunda femoral artery takeoff. It was then removed. The sheath was removed and hemostasis was achieved with direct pressure. FINDINGS: Angiography of the SMA demonstrates no source of active bleeding. Selective injection of the IMA demonstrates active contrast extravasation at the splenic flexure. Competitive flow from the middle colicky artery is noted. Several subsequent contrast  injections via a micro catheter in the marginal artery of [place] demonstrate active extravasation at the splenic flexure. This occurs at a small arcade vessel from the branch of the marginal artery of [place] at the watershed zone between the middle and left colloid arteries. IMPRESSION: Diagnostic angiogram demonstrates active contrast extravasation from a small arcade vessel of the left common artery at the splenic flexure. Embolization of this small vessel was unsuccessful as described above. At the end of the procedure, the patient's systolic blood pressure remained above 100 mm Hg with a pulse of 100. This was discussed with Dr. HHeber Carolinaof the internal medicine teaching service. Electronically Signed   By: AMarybelle KillingsM.D.   On: 09/23/2015 08:25   Ir [place] Vasc Access Right  09/23/2015  INDICATION: Gastrointestinal bleeding. Positive nuclear medicine imaging. Active bleeding from the splenic flexure noted. EXAM: SELECTIVE  VISCERAL ARTERIOGRAPHY; IR ULTRASOUND GUIDANCE VASC ACCESS RIGHT MEDICATIONS: Ancef 1 g. The antibiotic was administered within 1 hour of the procedure ANESTHESIA/SEDATION: Moderate (conscious) sedation was employed during this procedure. A total of Versed 1.5 mg and Fentanyl 50 mcg was administered intravenously. Moderate Sedation Time: 80 minutes. The patient's level of consciousness and vital signs were monitored continuously by radiology nursing throughout the procedure under my direct supervision. CONTRAST:  178m ISOVUE-300 IOPAMIDOL (ISOVUE-300) INJECTION 61% FLUOROSCOPY TIME:  Fluoroscopy Time: 26 minutes 48 seconds (210 mGy). COMPLICATIONS: None immediate. PROCEDURE: Informed consent was obtained from the patient following explanation of the procedure, risks, benefits and alternatives. The patient understands, agrees and consents for the procedure. All questions were addressed. A time out was performed prior to the initiation of the procedure. Maximal barrier sterile  technique utilized including caps, mask, sterile gowns, sterile gloves, large sterile drape, hand hygiene, and Betadine prep. The right groin was prepped and draped in a sterile fashion. 1% lidocaine was utilized for local anesthesia. Under sonographic guidance, a micropuncture needle was inserted into the right common femoral artery and removed over a 018 wire which was up sized to a Bentson. A 5 French sheath was inserted. A micropuncture needle was then inserted into the right common femoral vein and removed over a 018 wire. A 5 French transitional dilator was inserted for venous access. A 5 French pigtail catheter was advanced into the aorta. Lateral aortography was performed demonstrating patency at the origin of the celiac, SMA, and IMA. A cobra catheter was advanced into the SMA and angiography was performed. This showed no source of gastrointestinal bleeding. The Cobra catheter was exchanged over a Bentson wire for a Sos Omni catheter. The IMA was selected and angiography was performed. Active extravasation at the splenic flexure was noted. A micro catheter was then advanced through the SKakacatheter and into the marginal artery of [place]. Several contrast injections were performed. Active extravasation was again identified at the same location, however placement of the catheter into the culprit vessel was not possible. The micro catheter was removed. The Sos was advanced into the aorta and straightened with the Bentson wire. Right femoral angiography was performed demonstrating a high profunda femoral artery takeoff. It was then removed. The sheath was removed and hemostasis was achieved with direct pressure. FINDINGS: Angiography of the SMA demonstrates no source of active bleeding. Selective injection of the IMA demonstrates active contrast extravasation at the splenic flexure. Competitive flow from the middle colicky artery is noted. Several subsequent contrast injections via a micro catheter in  the marginal artery of [place] demonstrate active extravasation at the splenic flexure. This occurs at a small arcade vessel from the branch of the marginal artery of [place] at the watershed zone between the middle and left colloid arteries. IMPRESSION: Diagnostic angiogram demonstrates active contrast extravasation from a small arcade vessel of the left common artery at the splenic flexure. Embolization of this small vessel was unsuccessful as described above. At the end of the procedure, the patient's systolic blood pressure remained above 100 mm Hg with a pulse of 100. This was discussed with Dr. HHeber Carolinaof the internal medicine teaching service. Electronically Signed   By: AMarybelle KillingsM.D.   On: 09/23/2015 08:25   Dg Chest Portable 1 View  09/22/2015  CLINICAL DATA:  Shortness of breath on exertion with occasional cough. EXAM: PORTABLE CHEST 1 VIEW COMPARISON:  None. FINDINGS: Cardiomegaly. Aortic atherosclerosis. Hyperinflation. No active infiltrates or failure. No effusion or pneumothorax.  Osteopenia. IMPRESSION: Cardiomegaly.  No active infiltrates or failure. Electronically Signed   By: Staci Righter M.D.   On: 09/22/2015 11:52   Medications: I have reviewed the patient's current medications. Scheduled Meds: . sodium chloride   Intravenous Once  . sodium chloride   Intravenous STAT  . sodium chloride   Intravenous Once  . sodium chloride   Intravenous Once  . insulin aspart  0-9 Units Subcutaneous Q4H  . pantoprazole (PROTONIX) IV  40 mg Intravenous BID   Continuous Infusions:  PRN Meds:. Assessment/Plan: 82 yo F with acute onset BRBPR and 3 days of non-bloody diarrhea, otherwise asymptomatic.   Acute GI bleed with anemia:  Nuclear medicine scan suggestive of diverticular bleedings vs. AVM.  Cannot rule out malignancy, although less likely.  VIR embolization unsuccessful.  Continues to bleed.  -GI and surgery following, appreciate recs -Following vitals closely (every 15  minutes) -Will replete fluids with bolus NS as necessary - goal for MAP > 60  -CBC q6 to follow Hb  -Transfuse if Hb<7 -Continue Protonix  -Continue tele monitoring   Hypertension -well-controlled, blood pressures soft during course of hospitalization -hold amlodipine  DM -continue insulin -hold metformin  Arthritis -hold Meloxicam  RLS -hold ropinirole   This is a Careers information officer Note.  The care of the patient was discussed with Dr. Maryellen Pile and the assessment and plan formulated with their assistance.  Please see their attached note for official documentation of the daily encounter.   LOS: 1 day   Randal Buba, Med Student 09/23/2015, 11:08 AM

## 2015-09-23 NOTE — Progress Notes (Signed)
   Subjective: [name] reports no complaints this morning. Has grossly bloody stool output from her rectal tube. Denies any shortness of breath, chest pain, abdominal pain, nausea, vomiting, lightheadedness/dizziness, vision changes. Overnight, underwent unsuccessful VIR embolization by IR. Became hypotensive and Hgb dropped to 6.7. Has received 3 units PRBC with appropriate response in her Hgb.   Objective: Vital signs in last 24 hours: Filed Vitals:   09/23/15 0930 09/23/15 1000 09/23/15 1015 09/23/15 1100  BP: 90/45 105/42 105/45 91/80  Pulse: 99 95 109 111  Temp:      TempSrc:      Resp: 29 17 17 31   SpO2: 99% 99% 100% 100%   Weight change:   Intake/Output Summary (Last 24 hours) at 09/23/15 1136 Last data filed at 09/23/15 1057  Gross per 24 hour  Intake 4016.16 ml  Output   1200 ml  Net 2816.16 ml    General: alert, lying comfortably in bed, NAD, AAOx3 Lungs: normal respiratory effort, no accessory muscle use, normal breath sounds, no crackles, and no wheezes. Heart: normal rate, regular rhythm, no murmur, no gallop, and no rub.  Abdomen: soft, non-tender, normal bowel sounds, no distention, no guarding, no rebound tenderness Pulses: 2+ DP/PT pulses bilaterally Extremities: trace pedal edema Skin: turgor normal and no rashes.  Psych: normal mood and affect  Medications: I have reviewed the patient's current medications. Scheduled Meds: . sodium chloride   Intravenous Once  . sodium chloride   Intravenous STAT  . sodium chloride   Intravenous Once  . sodium chloride   Intravenous Once  . insulin aspart  0-9 Units Subcutaneous Q4H  . pantoprazole (PROTONIX) IV  40 mg Intravenous BID   Continuous Infusions:  PRN Meds:. Assessment/Plan:  Acute GI bleed with anemia: Underwent unsuccessful VIR embolization overnight for active bleeding at the splenic flexure. Spoke with IR who report there is no role in re-attempting as the vessel anatomy will not allow for successful  embolization. Hgb dropped to 6.7 post procedure (after receiving 1 unit). Received 2 units overnight with appropriate rise in her Hgb to 8.6. Still has ongoing bleeding with grossly bloody stools in her rectal tube bag. Surgery consulted overnight. Age not prohibitive but recovery would be difficult. Recommenced colonoscopy and re-attempt at embolization prior to surgery attempt. IR does not believe another attempt at embolization would be of any benefit at this time. With her ongoing brisk bleeding it is unlikely colonoscopy would be able to visualize the source.  Will confer with surgery today. Family would want aggressive care as patient's baseline functional status is high.  -Appreciate GI and Surgery's help -CBC q6hr, transfuse <7 -Protonix IV bid -NS 75 mL/hr -Keep MAP > 60 -PPCM aware, appreciate help  DM -CBG q4hr while NPO -SSI-s  Dispo: Disposition is deferred at this time, awaiting improvement of current medical problems.    The patient does have a current PCP (No primary care provider on file.) and does need [facility]up [facility] after discharge.  The patient does not have transportation limitations that hinder transportation to clinic appointments.  .Services Needed at time of discharge: Y = Yes, Blank = No PT:   OT:   RN:   Equipment:   Other:     LOS: 1 day   Sharon NoseNathan Ashly Yepez, MD [facility] PGY-1 [phone] 09/23/2015, 11:36 AM

## 2015-09-24 DIAGNOSIS — I959 Hypotension, unspecified: Secondary | ICD-10-CM

## 2015-09-24 LAB — CBC
HCT: 23.7 % — ABNORMAL LOW (ref 36.0–46.0)
HEMATOCRIT: 23.8 % — AB (ref 36.0–46.0)
HEMATOCRIT: 20.9 % — AB (ref 36.0–46.0)
HEMOGLOBIN: 8 g/dL — AB (ref 12.0–15.0)
HEMOGLOBIN: 7 g/dL — AB (ref 12.0–15.0)
Hemoglobin: 8 g/dL — ABNORMAL LOW (ref 12.0–15.0)
MCH: 29.3 pg (ref 26.0–34.0)
MCH: 29.2 pg (ref 26.0–34.0)
MCH: 29.1 pg (ref 26.0–34.0)
MCHC: 33.5 g/dL (ref 30.0–36.0)
MCHC: 33.8 g/dL (ref 30.0–36.0)
MCHC: 33.6 g/dL (ref 30.0–36.0)
MCV: 86.5 fL (ref 78.0–100.0)
MCV: 86.5 fL (ref 78.0–100.0)
MCV: 87.4 fL (ref 78.0–100.0)
PLATELETS: 105 10*3/uL — AB (ref 150–400)
Platelets: 122 10*3/uL — ABNORMAL LOW (ref 150–400)
Platelets: 130 10*3/uL — ABNORMAL LOW (ref 150–400)
RBC: 2.39 MIL/uL — AB (ref 3.87–5.11)
RBC: 2.74 MIL/uL — ABNORMAL LOW (ref 3.87–5.11)
RBC: 2.75 MIL/uL — ABNORMAL LOW (ref 3.87–5.11)
RDW: 14.4 % (ref 11.5–15.5)
RDW: 14.8 % (ref 11.5–15.5)
RDW: 14.9 % (ref 11.5–15.5)
WBC: 10.4 10*3/uL (ref 4.0–10.5)
WBC: 10.5 10*3/uL (ref 4.0–10.5)
WBC: 10.5 10*3/uL (ref 4.0–10.5)

## 2015-09-24 LAB — PREPARE RBC (CROSSMATCH)

## 2015-09-24 LAB — GLUCOSE, CAPILLARY
GLUCOSE-CAPILLARY: 130 mg/dL — AB (ref 65–99)
GLUCOSE-CAPILLARY: 137 mg/dL — AB (ref 65–99)
Glucose-Capillary: 137 mg/dL — ABNORMAL HIGH (ref 65–99)
Glucose-Capillary: 106 mg/dL — ABNORMAL HIGH (ref 65–99)
Glucose-Capillary: 187 mg/dL — ABNORMAL HIGH (ref 65–99)

## 2015-09-24 MED ORDER — SODIUM CHLORIDE 0.9 % IV SOLN
Freq: Once | INTRAVENOUS | Status: AC
  Administered 2015-09-25: 04:00:00 via INTRAVENOUS

## 2015-09-24 MED ORDER — PANTOPRAZOLE SODIUM 40 MG PO TBEC
40.0000 mg | DELAYED_RELEASE_TABLET | Freq: Two times a day (BID) | ORAL | Status: DC
  Administered 2015-09-24 – 2015-09-29 (×11): 40 mg via ORAL
  Filled 2015-09-24 (×11): qty 1

## 2015-09-24 NOTE — Progress Notes (Signed)
[facility] Progress Note  Subjective: Patient is awake and alert. Rectal tube has been taken out. There was some dark blood per rectum last night.  Objective: Vital signs in last 24 hours: Temp:  [98 F (36.7 C)-98.9 F (37.2 C)] 98.9 F (37.2 C) (05/05 0800) Pulse Rate:  [93-121] 112 (05/05 0800) Resp:  [14-31] 21 (05/05 0800) BP: (91-162)/(39-80) 144/73 mmHg (05/05 0800) SpO2:  [96 %-100 %] 100 % (05/05 0800) Weight change:    PE:  Heart regular rhythm  Abdomen soft nontender  Lab Results: Results for orders placed or performed during the hospital encounter of 09/22/15 (from the past 24 hour(s))  Glucose, capillary     Status: Abnormal   Collection Time: 09/23/15 11:36 AM  Result Value Ref Range   Glucose-Capillary 101 (H) 65 - 99 mg/dL  CBC     Status: Abnormal   Collection Time: 09/23/15 12:10 PM  Result Value Ref Range   WBC 13.8 (H) 4.0 - 10.5 K/uL   RBC 2.63 (L) 3.87 - 5.11 MIL/uL   Hemoglobin 7.5 (L) 12.0 - 15.0 g/dL   HCT 16.122.6 (L) 09.636.0 - 04.546.0 %   MCV 85.9 78.0 - 100.0 fL   MCH 28.5 26.0 - 34.0 pg   MCHC 33.2 30.0 - 36.0 g/dL   RDW 40.913.9 81.111.5 - 91.415.5 %   Platelets 114 (L) 150 - 400 K/uL  Prepare RBC     Status: None   Collection Time: 09/23/15  1:52 PM  Result Value Ref Range   Order Confirmation ORDER PROCESSED BY BLOOD BANK   Glucose, capillary     Status: None   Collection Time: 09/23/15  3:48 PM  Result Value Ref Range   Glucose-Capillary 95 65 - 99 mg/dL  CBC     Status: Abnormal   Collection Time: 09/23/15  7:40 PM  Result Value Ref Range   WBC 11.5 (H) 4.0 - 10.5 K/uL   RBC 3.12 (L) 3.87 - 5.11 MIL/uL   Hemoglobin 8.9 (L) 12.0 - 15.0 g/dL   HCT 78.226.8 (L) 95.636.0 - 21.346.0 %   MCV 85.9 78.0 - 100.0 fL   MCH 28.5 26.0 - 34.0 pg   MCHC 33.2 30.0 - 36.0 g/dL   RDW 08.614.1 57.811.5 - 46.915.5 %   Platelets 109 (L) 150 - 400 K/uL  Glucose, capillary     Status: Abnormal   Collection Time: 09/23/15  7:55 PM  Result Value Ref Range   Glucose-Capillary  132 (H) 65 - 99 mg/dL  Glucose, capillary     Status: Abnormal   Collection Time: 09/23/15 11:39 PM  Result Value Ref Range   Glucose-Capillary 120 (H) 65 - 99 mg/dL  CBC     Status: Abnormal   Collection Time: 09/24/15 12:33 AM  Result Value Ref Range   WBC 10.4 4.0 - 10.5 K/uL   RBC 2.74 (L) 3.87 - 5.11 MIL/uL   Hemoglobin 8.0 (L) 12.0 - 15.0 g/dL   HCT 62.923.7 (L) 52.836.0 - 41.346.0 %   MCV 86.5 78.0 - 100.0 fL   MCH 29.2 26.0 - 34.0 pg   MCHC 33.8 30.0 - 36.0 g/dL   RDW 24.414.4 01.011.5 - 27.215.5 %   Platelets 105 (L) 150 - 400 K/uL  Glucose, capillary     Status: Abnormal   Collection Time: 09/24/15  3:28 AM  Result Value Ref Range   Glucose-Capillary 137 (H) 65 - 99 mg/dL  Glucose, capillary     Status: Abnormal   Collection Time:  09/24/15  8:13 AM  Result Value Ref Range   Glucose-Capillary 130 (H) 65 - 99 mg/dL  CBC     Status: Abnormal   Collection Time: 09/24/15  8:29 AM  Result Value Ref Range   WBC 10.5 4.0 - 10.5 K/uL   RBC 2.75 (L) 3.87 - 5.11 MIL/uL   Hemoglobin 8.0 (L) 12.0 - 15.0 g/dL   HCT 91.4 (L) 78.2 - 95.6 %   MCV 86.5 78.0 - 100.0 fL   MCH 29.1 26.0 - 34.0 pg   MCHC 33.6 30.0 - 36.0 g/dL   RDW 21.3 08.6 - 57.8 %   Platelets 122 (L) 150 - 400 K/uL    Studies/Results: No results found.    Assessment: GI bleed. This is from splenic flexure area. Seems to be either slowing or resolved.  Plan:   Continue to follow clinically. If she has further bleeding as per surgical note she will more than likely need left hemicolectomy.    Gwenevere Abbot 09/24/2015, 10:05 AM  Pager: [phone] If no answer or after 5 PM call [phone] Lab Results  Component Value Date   HGB 8.0* 09/24/2015   HGB 8.0* 09/24/2015   HGB 8.9* 09/23/2015   HCT 23.8* 09/24/2015   HCT 23.7* 09/24/2015   HCT 26.8* 09/23/2015

## 2015-09-24 NOTE — Progress Notes (Signed)
Patient continues to have some smearing of dark red blood on bed pad.

## 2015-09-24 NOTE — Progress Notes (Signed)
NURSING PROGRESS NOTE  Sharon Nonesdna Roat [id number] Transfer Data: 09/24/2015 6:27 PM Attending Provider: Inez CatalinaEmily B Mullen, MD PCP:No primary care provider on file. Code Status: Full  Sharon Conway is a 82 y.o. female patient transferred from [address] -No acute distress noted.  -No complaints of shortness of breath.  -No complaints of chest pain.   Cardiac Monitoring: Box # 02 in place. Cardiac monitor yields: normal.  Blood pressure 123/54, pulse 102, temperature 97.8 F (36.6 C), temperature source Axillary, resp. rate 24, SpO2 99 %.   IV Fluids:  IV in place, occlusive dsg intact without redness, IV cath   Allergies:  Review of patient's allergies indicates no known allergies.  Past Medical History:   has a past medical history of Hypertension.  Past Surgical History:   has past surgical history that includes Abdominal hysterectomy.  Social History:   reports that she has never smoked. She does not have any smokeless tobacco history on file.  Skin: small skin tear with pink foam dressing  Patient/Family orientated to room. Information packet given to patient/family. Admission inpatient armband information verified with patient/family to include name and date of birth and placed on patient arm. Side rails up x 2, fall assessment and education completed with patient/family. Patient/family able to verbalize understanding of risk associated with falls and verbalized understanding to call for assistance before getting out of bed. Call light within reach. Patient/family able to voice and demonstrate understanding of unit orientation instructions.    Will continue to evaluate and treat per MD orders.

## 2015-09-24 NOTE — Progress Notes (Signed)
   Subjective: [name] reports no complaints this morning. Denies any further bleeding. Stool output has decreased and rectal tube was removed yesterday. Denies any chest pain, nausea, vomiting, shortness of breath, dizziness/lightheadedness.  Objective: Vital signs in last 24 hours: Filed Vitals:   09/24/15 0700 09/24/15 0800 09/24/15 0900 09/24/15 1000  BP: 110/45 144/73 115/52 145/60  Pulse: 96 112 101 107  Temp:  98.9 F (37.2 C)    TempSrc:  Oral    Resp: 19 21 22 26   SpO2: 96% 100% 99% 100%   Weight change:   Intake/Output Summary (Last 24 hours) at 09/24/15 1122 Last data filed at 09/24/15 1100  Gross per 24 hour  Intake 1847.5 ml  Output   1445 ml  Net  402.5 ml    General: alert, lying comfortably in bed, NAD, AAOx3 Lungs: normal respiratory effort, no accessory muscle use, normal breath sounds, no crackles, and no wheezes. Heart: normal rate, regular rhythm, no murmur, no gallop, and no rub.  Abdomen: soft, non-tender, normal bowel sounds, no distention, no guarding, no rebound tenderness Pulses: 2+ DP/PT pulses bilaterally Extremities: no edmea Skin: turgor normal and no rashes.  Psych: normal mood and affect  Medications: I have reviewed the patient's current medications. Scheduled Meds: . antiseptic oral rinse  7 mL Mouth Rinse q12n4p  . chlorhexidine  15 mL Mouth Rinse BID  . insulin aspart  0-9 Units Subcutaneous Q4H  . pantoprazole (PROTONIX) IV  40 mg Intravenous BID   Continuous Infusions:  PRN Meds:. Assessment/Plan:  Acute GI bleed localized to splenic flexure with anemia: Bleeding appears to have slowed down and possibly resolved. BP has been stable overnight. Hgb has been stable around 8 after transfusion of 1 unit PRBC yesterday afternoon. IR not able to embolize and GI does not believe there is any role for colonoscopy at this time. Surgery following. As long as patient remains stable no need for surgery at this time.  -Advance diet as  tolerated -DC IVF -Change Protonix to PO -Transfer to tele -CBC q12hr, transfuse <7 -PT/OT -Appreciate surgery's help  DM -CBG qac and qhs -SSI-s  Dispo: Disposition is deferred at this time, awaiting improvement of current medical problems.    The patient does have a current PCP (No primary care provider on file.) and [facility].  The patient does not have transportation limitations that hinder transportation to clinic appointments.  .Services Needed at time of discharge: Y = Yes, Blank = No PT:   OT:   RN:   Equipment:   Other:     LOS: 2 days   Sharon NoseNathan Mayson Mcneish, MD [facility] PGY-1 [phone] 09/24/2015, 11:22 AM

## 2015-09-24 NOTE — Progress Notes (Signed)
Subjective: Patient is comfortable - no complaints Passed some more bloody bowel movements yesterday.  Objective: Vital signs in last 24 hours: Temp:  [98 F (36.7 C)-98.9 F (37.2 C)] 98.9 F (37.2 C) (05/05 0800) Pulse Rate:  [93-121] 112 (05/05 0800) Resp:  [14-31] 21 (05/05 0800) BP: (90-162)/(39-80) 144/73 mmHg (05/05 0800) SpO2:  [96 %-100 %] 100 % (05/05 0800) Last BM Date: 09/23/15  Intake/Output from previous day: 05/04 0701 - 05/05 0700 In: 1997.5 [I.V.:1662.5; Blood:335] Out: 1370 [Urine:975; Stool:395] Intake/Output this shift:    General appearance: alert, cooperative and no distress GI: soft, non-tender; bowel sounds normal; no masses,  no organomegaly  Lab Results:   Recent Labs  09/23/15 1940 09/24/15 0033  WBC 11.5* 10.4  HGB 8.9* 8.0*  HCT 26.8* 23.7*  PLT 109* 105*   BMET  Recent Labs  09/22/15 1048 09/23/15 0539  NA 137 141  K 5.2* 5.0  CL 104 116*  CO2 22 19*  GLUCOSE 148* 128*  BUN 25* 27*  CREATININE 1.05* 0.90  CALCIUM 9.6 6.9*   PT/INR  Recent Labs  09/22/15 1645 09/23/15 0539  LABPROT 14.3 17.6*  INR 1.09 1.44   ABG No results for input(s): PHART, HCO3 in the last 72 hours.  Invalid input(s): PCO2, PO2  Studies/Results: Nm Gi Blood Loss  09/22/2015  CLINICAL DATA:  Active GI bleed. EXAM: NUCLEAR MEDICINE GASTROINTESTINAL BLEEDING SCAN TECHNIQUE: Sequential abdominal images were obtained following intravenous administration of Tc-17m labeled red blood cells. RADIOPHARMACEUTICALS:  25.0 mCi Tc-65m in-vitro labeled red cells. COMPARISON:  None. FINDINGS: There is a tubular focus of increased uptake within the proximal left colon which increases in intensity and exhibits antegrade progression conforming to the expected configuration distal colon. Normal physiologic activity is identified within the liver, GU tract and blood pool. IMPRESSION: 1. Examination is positive for active GI bleed which appears to originate at the  level of the splenic flexure. Electronically Signed   By: Signa Kell M.D.   On: 09/22/2015 16:01   Ir Angiogram Visceral Selective  09/23/2015  INDICATION: Gastrointestinal bleeding. Positive nuclear medicine imaging. Active bleeding from the splenic flexure noted. EXAM: SELECTIVE VISCERAL ARTERIOGRAPHY; IR ULTRASOUND GUIDANCE VASC ACCESS RIGHT MEDICATIONS: Ancef 1 g. The antibiotic was administered within 1 hour of the procedure ANESTHESIA/SEDATION: Moderate (conscious) sedation was employed during this procedure. A total of Versed 1.5 mg and Fentanyl 50 mcg was administered intravenously. Moderate Sedation Time: 80 minutes. The patient's level of consciousness and vital signs were monitored continuously by radiology nursing throughout the procedure under my direct supervision. CONTRAST:  ISOVUE-300 IOPAMIDOL (ISOVUE-300) INJECTION 61% FLUOROSCOPY TIME:  Fluoroscopy Time: 26 minutes 48 seconds (210 mGy). COMPLICATIONS: None immediate. PROCEDURE: Informed consent was obtained from the patient following explanation of the procedure, risks, benefits and alternatives. The patient understands, agrees and consents for the procedure. All questions were addressed. A time out was performed prior to the initiation of the procedure. Maximal barrier sterile technique utilized including caps, mask, sterile gowns, sterile gloves, large sterile drape, hand hygiene, and Betadine prep. The right groin was prepped and draped in a sterile fashion. 1% lidocaine was utilized for local anesthesia. Under sonographic guidance, a micropuncture needle was inserted into the right common femoral artery and removed over a 018 wire which was up sized to a Bentson. A 5 French sheath was inserted. A micropuncture needle was then inserted into the right common femoral vein and removed over a 018 wire. A 5 French transitional dilator was inserted  for venous access. A 5 French pigtail catheter was advanced into the aorta. Lateral  aortography was performed demonstrating patency at the origin of the celiac, SMA, and IMA. A cobra catheter was advanced into the SMA and angiography was performed. This showed no source of gastrointestinal bleeding. The Cobra catheter was exchanged over a Bentson wire for a Sos Omni catheter. The IMA was selected and angiography was performed. Active extravasation at the splenic flexure was noted. A micro catheter was then advanced through the Sos Omni catheter and into the marginal artery of [place]. Several contrast injections were performed. Active extravasation was again identified at the same location, however placement of the catheter into the culprit vessel was not possible. The micro catheter was removed. The Sos was advanced into the aorta and straightened with the Bentson wire. Right femoral angiography was performed demonstrating a high profunda femoral artery takeoff. It was then removed. The sheath was removed and hemostasis was achieved with direct pressure. FINDINGS: Angiography of the SMA demonstrates no source of active bleeding. Selective injection of the IMA demonstrates active contrast extravasation at the splenic flexure. Competitive flow from the middle colicky artery is noted. Several subsequent contrast injections via a micro catheter in the marginal artery of [place] demonstrate active extravasation at the splenic flexure. This occurs at a small arcade vessel from the branch of the marginal artery of Drummond at the watershed zone between the middle and left colloid arteries. IMPRESSION: Diagnostic angiogram demonstrates active contrast extravasation from a small arcade vessel of the left common artery at the splenic flexure. Embolization of this small vessel was unsuccessful as described above. At the end of the procedure, the patient's systolic blood pressure remained above 100 mm Hg with a pulse of 100. This was discussed with Dr. Mikey BussingHoffman of the internal medicine teaching service.  Electronically Signed   By: Jolaine ClickArthur  Hoss M.D.   On: 09/23/2015 08:25   Ir Angiogram Visceral Selective  09/23/2015  INDICATION: Gastrointestinal bleeding. Positive nuclear medicine imaging. Active bleeding from the splenic flexure noted. EXAM: SELECTIVE VISCERAL ARTERIOGRAPHY; IR ULTRASOUND GUIDANCE VASC ACCESS RIGHT MEDICATIONS: Ancef 1 g. The antibiotic was administered within 1 hour of the procedure ANESTHESIA/SEDATION: Moderate (conscious) sedation was employed during this procedure. A total of Versed 1.5 mg and Fentanyl 50 mcg was administered intravenously. Moderate Sedation Time: 80 minutes. The patient's level of consciousness and vital signs were monitored continuously by radiology nursing throughout the procedure under my direct supervision. CONTRAST:  100mL ISOVUE-300 IOPAMIDOL (ISOVUE-300) INJECTION 61% FLUOROSCOPY TIME:  Fluoroscopy Time: 26 minutes 48 seconds (210 mGy). COMPLICATIONS: None immediate. PROCEDURE: Informed consent was obtained from the patient following explanation of the procedure, risks, benefits and alternatives. The patient understands, agrees and consents for the procedure. All questions were addressed. A time out was performed prior to the initiation of the procedure. Maximal barrier sterile technique utilized including caps, mask, sterile gowns, sterile gloves, large sterile drape, hand hygiene, and Betadine prep. The right groin was prepped and draped in a sterile fashion. 1% lidocaine was utilized for local anesthesia. Under sonographic guidance, a micropuncture needle was inserted into the right common femoral artery and removed over a 018 wire which was up sized to a Bentson. A 5 French sheath was inserted. A micropuncture needle was then inserted into the right common femoral vein and removed over a 018 wire. A 5 French transitional dilator was inserted for venous access. A 5 French pigtail catheter was advanced into the aorta. Lateral aortography was performed demonstrating  patency at the origin of the celiac, SMA, and IMA. A cobra catheter was advanced into the SMA and angiography was performed. This showed no source of gastrointestinal bleeding. The Cobra catheter was exchanged over a Bentson wire for a Sos Omni catheter. The IMA was selected and angiography was performed. Active extravasation at the splenic flexure was noted. A micro catheter was then advanced through the Sos Omni catheter and into the marginal artery of [place]. Several contrast injections were performed. Active extravasation was again identified at the same location, however placement of the catheter into the culprit vessel was not possible. The micro catheter was removed. The Sos was advanced into the aorta and straightened with the Bentson wire. Right femoral angiography was performed demonstrating a high profunda femoral artery takeoff. It was then removed. The sheath was removed and hemostasis was achieved with direct pressure. FINDINGS: Angiography of the SMA demonstrates no source of active bleeding. Selective injection of the IMA demonstrates active contrast extravasation at the splenic flexure. Competitive flow from the middle colicky artery is noted. Several subsequent contrast injections via a micro catheter in the marginal artery of [place] demonstrate active extravasation at the splenic flexure. This occurs at a small arcade vessel from the branch of the marginal artery of [place] at the watershed zone between the middle and left colloid arteries. IMPRESSION: Diagnostic angiogram demonstrates active contrast extravasation from a small arcade vessel of the left common artery at the splenic flexure. Embolization of this small vessel was unsuccessful as described above. At the end of the procedure, the patient's systolic blood pressure remained above 100 mm Hg with a pulse of 100. This was discussed with Dr. Mikey Bussing of the internal medicine teaching service. Electronically Signed   By: Jolaine Click  M.D.   On: 09/23/2015 08:25   Ir Angiogram Selective Each Additional Vessel  09/24/2015  CLINICAL DATA:  Gastrointestinal bleeding EXAM: ADDITIONAL ARTERIOGRAPHY COMPARISON:  None. FINDINGS: Please refer to accession # [id number]. IMPRESSION: Please refer to accession # [id number]. Electronically Signed   By: Jolaine Click M.D.   On: 09/24/2015 08:38   Ir US Guide Vasc Access Right  09/23/2015  INDICATION: Gastrointestinal bleeding. Positive nuclear medicine imaging. Active bleeding from the splenic flexure noted. EXAM: SELECTIVE VISCERAL ARTERIOGRAPHY; IR ULTRASOUND GUIDANCE VASC ACCESS RIGHT MEDICATIONS: Ancef 1 g. The antibiotic was administered within 1 hour of the procedure ANESTHESIA/SEDATION: Moderate (conscious) sedation was employed during this procedure. A total of Versed 1.5 mg and Fentanyl 50 mcg was administered intravenously. Moderate Sedation Time: 80 minutes. The patient's level of consciousness and vital signs were monitored continuously by radiology nursing throughout the procedure under my direct supervision. CONTRAST:  ISOVUE-300 IOPAMIDOL (ISOVUE-300) INJECTION 61% FLUOROSCOPY TIME:  Fluoroscopy Time: 26 minutes 48 seconds (210 mGy). COMPLICATIONS: None immediate. PROCEDURE: Informed consent was obtained from the patient following explanation of the procedure, risks, benefits and alternatives. The patient understands, agrees and consents for the procedure. All questions were addressed. A time out was performed prior to the initiation of the procedure. Maximal barrier sterile technique utilized including caps, mask, sterile gowns, sterile gloves, large sterile drape, hand hygiene, and Betadine prep. The right groin was prepped and draped in a sterile fashion. 1% lidocaine was utilized for local anesthesia. Under sonographic guidance, a micropuncture needle was inserted into the right common femoral artery and removed over a 018 wire which was up sized to a Bentson. A 5 French sheath was  inserted. A micropuncture needle was then inserted into the right  common femoral vein and removed over a 018 wire. A 5 French transitional dilator was inserted for venous access. A 5 French pigtail catheter was advanced into the aorta. Lateral aortography was performed demonstrating patency at the origin of the celiac, SMA, and IMA. A cobra catheter was advanced into the SMA and angiography was performed. This showed no source of gastrointestinal bleeding. The Cobra catheter was exchanged over a Bentson wire for a Sos Omni catheter. The IMA was selected and angiography was performed. Active extravasation at the splenic flexure was noted. A micro catheter was then advanced through the Sos Omni catheter and into the marginal artery of [place]. Several contrast injections were performed. Active extravasation was again identified at the same location, however placement of the catheter into the culprit vessel was not possible. The micro catheter was removed. The Sos was advanced into the aorta and straightened with the Bentson wire. Right femoral angiography was performed demonstrating a high profunda femoral artery takeoff. It was then removed. The sheath was removed and hemostasis was achieved with direct pressure. FINDINGS: Angiography of the SMA demonstrates no source of active bleeding. Selective injection of the IMA demonstrates active contrast extravasation at the splenic flexure. Competitive flow from the middle colicky artery is noted. Several subsequent contrast injections via a micro catheter in the marginal artery of [place] demonstrate active extravasation at the splenic flexure. This occurs at a small arcade vessel from the branch of the marginal artery of [place] at the watershed zone between the middle and left colloid arteries. IMPRESSION: Diagnostic angiogram demonstrates active contrast extravasation from a small arcade vessel of the left common artery at the splenic flexure. Embolization of this  small vessel was unsuccessful as described above. At the end of the procedure, the patient's systolic blood pressure remained above 100 mm Hg with a pulse of 100. This was discussed with Dr. Mikey Bussing of the internal medicine teaching service. Electronically Signed   By: Jolaine Click M.D.   On: 09/23/2015 08:25   Dg Chest Portable 1 View  09/22/2015  CLINICAL DATA:  Shortness of breath on exertion with occasional cough. EXAM: PORTABLE CHEST 1 VIEW COMPARISON:  None. FINDINGS: Cardiomegaly. Aortic atherosclerosis. Hyperinflation. No active infiltrates or failure. No effusion or pneumothorax. Osteopenia. IMPRESSION: Cardiomegaly.  No active infiltrates or failure. Electronically Signed   By: Elsie Stain M.D.   On: 09/22/2015 11:52    Anti-infectives: Anti-infectives    Start     Dose/Rate Route Frequency Ordered Stop   09/22/15 1818  ceFAZolin (ANCEF) 1-5 GM-% IVPB    Comments:  Ludwig Lean, Amy   : cabinet override      09/22/15 1818 09/23/15 0629      Assessment/Plan: Lower GI bleed - localized to splenic flexure I discussed with the patient that all other options appear to be unsuccessful - IR not able to embolize/ GI does not feel that colonoscopy would be helpful.  She is not bleeding rapidly enough to become hypotensive, but if she continues to bleed, we will have to consider left hemicolectomy.  She wants to wait to see if the bleeding resolves spontaneously, so we will follow and be available for surgery early next week if needed.  Wilmon Arms. Corliss Skains, MD, [facility] Surgery  General/ Trauma Surgery  09/24/2015 9:23 AM   Tameca Jerez K. 09/24/2015

## 2015-09-24 NOTE — Care Management Important Message (Signed)
Important Message  Patient Details  Name: Sharon Conway MRN: [id number] Date of Birth: [birth year]   Medicare Important Message Given:  Yes    Rosenda Geffrard Abena 09/24/2015, 3:03 PM

## 2015-09-24 NOTE — Progress Notes (Signed)
Patient suddenly began choking/coughing while eating jello. VSS, sats 94-100%. Daughter at bedside, stated that this has happened several times at home but with no particular consistency of liquid or food. Patient had been tolerating other clears without choking or coughing. Daughter stated that she would follow up with pt's PCP upon discharge. MD paged.

## 2015-09-24 NOTE — Progress Notes (Signed)
Patient had bright red bloody watery stool combined with urine. Total 350 cc, unable to measure separately. Rectal pouch replaced for further measurement of bleeding. Dr. Earlene PlaterWallace paged and made aware. BP stable 120s, MAP 73. MD also aware of earlier choking/coughing episode. The next CBC is ordered for 18:00. Per MD, ok to continue with transfer to floor. Report given to 5W RN.

## 2015-09-24 NOTE — Progress Notes (Signed)
Subjective: [name] was seen and evaluated on rounds this morning. She has no complaints at this time and says she had no pain overnight.  Denies abdominal pain, SOB, CP, nausea, vomiting, light headedness, diaphoresis.Rectal tube removed yesterday evening because rate of bleeding decreased.  She received 1 unit PRBCs yesterday afternoon, has not required any units since.  CBC has remained stable at 8.0 since transfusion.   Objective: Vital signs in last 24 hours: Filed Vitals:   09/24/15 0700 09/24/15 0800 09/24/15 0900 09/24/15 1000  BP: 110/45 144/73 115/52 145/60  Pulse: 96 112 101 107  Temp:  98.9 F (37.2 C)    TempSrc:  Oral    Resp: 19 21 22 26   SpO2: 96% 100% 99% 100%   Weight change:   Intake/Output Summary (Last 24 hours) at 09/24/15 1111 Last data filed at 09/24/15 1100  Gross per 24 hour  Intake 1847.5 ml  Output   1445 ml  Net  402.5 ml   General: resting comfortably in bed, pleasant  Lungs: CTAB.  No wheezing CV: RRR. 2+ radial pulses  Abdomen: Soft, nondistended.  No TTP.  Normoactive bowel sounds.  Midline scar near umbilicus.  Extremities: Warm, well-perfused   Lab Results: CBC CBC Latest Ref Rng 09/24/2015 09/24/2015 09/23/2015  WBC 4.0 - 10.5 K/uL 10.5 10.4 11.5(H)  Hemoglobin 12.0 - 15.0 g/dL 8.0(L) 8.0(L) 8.9(L)  Hematocrit 36.0 - 46.0 % 23.8(L) 23.7(L) 26.8(L)  Platelets 150 - 400 K/uL 122(L) 105(L) 109(L)   Micro Results: Recent Results (from the past 240 hour(s))  MRSA PCR Screening     Status: None   Collection Time: 09/22/15  9:58 AM  Result Value Ref Range Status   MRSA by PCR NEGATIVE NEGATIVE Final    Comment:        The GeneXpert MRSA Assay (FDA approved for NASAL specimens only), is one component of a comprehensive MRSA colonization surveillance program. It is not intended to diagnose MRSA infection nor to guide or monitor treatment for MRSA infections.    Studies/Results: Nm Gi Blood Loss  09/22/2015  CLINICAL DATA:  Active GI  bleed. EXAM: NUCLEAR MEDICINE GASTROINTESTINAL BLEEDING SCAN TECHNIQUE: Sequential abdominal images were obtained following intravenous administration of Tc-61m labeled red blood cells. RADIOPHARMACEUTICALS:  25.0 mCi Tc-68m in-vitro labeled red cells. COMPARISON:  None. FINDINGS: There is a tubular focus of increased uptake within the proximal left colon which increases in intensity and exhibits antegrade progression conforming to the expected configuration distal colon. Normal physiologic activity is identified within the liver, GU tract and blood pool. IMPRESSION: 1. Examination is positive for active GI bleed which appears to originate at the level of the splenic flexure. Electronically Signed   By: Signa Kell M.D.   On: 09/22/2015 16:01   Ir Angiogram Visceral Selective  09/23/2015  INDICATION: Gastrointestinal bleeding. Positive nuclear medicine imaging. Active bleeding from the splenic flexure noted. EXAM: SELECTIVE VISCERAL ARTERIOGRAPHY; IR ULTRASOUND GUIDANCE VASC ACCESS RIGHT MEDICATIONS: Ancef 1 g. The antibiotic was administered within 1 hour of the procedure ANESTHESIA/SEDATION: Moderate (conscious) sedation was employed during this procedure. A total of Versed 1.5 mg and Fentanyl 50 mcg was administered intravenously. Moderate Sedation Time: 80 minutes. The patient's level of consciousness and vital signs were monitored continuously by radiology nursing throughout the procedure under my direct supervision. CONTRAST:  ISOVUE-300 IOPAMIDOL (ISOVUE-300) INJECTION 61% FLUOROSCOPY TIME:  Fluoroscopy Time: 26 minutes 48 seconds (210 mGy). COMPLICATIONS: None immediate. PROCEDURE: Informed consent was obtained from the patient following explanation  of the procedure, risks, benefits and alternatives. The patient understands, agrees and consents for the procedure. All questions were addressed. A time out was performed prior to the initiation of the procedure. Maximal barrier sterile technique  utilized including caps, mask, sterile gowns, sterile gloves, large sterile drape, hand hygiene, and Betadine prep. The right groin was prepped and draped in a sterile fashion. 1% lidocaine was utilized for local anesthesia. Under sonographic guidance, a micropuncture needle was inserted into the right common femoral artery and removed over a 018 wire which was up sized to a Bentson. A 5 French sheath was inserted. A micropuncture needle was then inserted into the right common femoral vein and removed over a 018 wire. A 5 French transitional dilator was inserted for venous access. A 5 French pigtail catheter was advanced into the aorta. Lateral aortography was performed demonstrating patency at the origin of the celiac, SMA, and IMA. A cobra catheter was advanced into the SMA and angiography was performed. This showed no source of gastrointestinal bleeding. The Cobra catheter was exchanged over a Bentson wire for a Sos Omni catheter. The IMA was selected and angiography was performed. Active extravasation at the splenic flexure was noted. A micro catheter was then advanced through the Sos Omni catheter and into the marginal artery of [place]. Several contrast injections were performed. Active extravasation was again identified at the same location, however placement of the catheter into the culprit vessel was not possible. The micro catheter was removed. The Sos was advanced into the aorta and straightened with the Bentson wire. Right femoral angiography was performed demonstrating a high profunda femoral artery takeoff. It was then removed. The sheath was removed and hemostasis was achieved with direct pressure. FINDINGS: Angiography of the SMA demonstrates no source of active bleeding. Selective injection of the IMA demonstrates active contrast extravasation at the splenic flexure. Competitive flow from the middle colicky artery is noted. Several subsequent contrast injections via a micro catheter in the marginal  artery of [place] demonstrate active extravasation at the splenic flexure. This occurs at a small arcade vessel from the branch of the marginal artery of [place] at the watershed zone between the middle and left colloid arteries. IMPRESSION: Diagnostic angiogram demonstrates active contrast extravasation from a small arcade vessel of the left common artery at the splenic flexure. Embolization of this small vessel was unsuccessful as described above. At the end of the procedure, the patient's systolic blood pressure remained above 100 mm Hg with a pulse of 100. This was discussed with Dr. Mikey Bussing of the internal medicine teaching service. Electronically Signed   By: Jolaine Click M.D.   On: 09/23/2015 08:25   Ir Angiogram Visceral Selective  09/23/2015  INDICATION: Gastrointestinal bleeding. Positive nuclear medicine imaging. Active bleeding from the splenic flexure noted. EXAM: SELECTIVE VISCERAL ARTERIOGRAPHY; IR ULTRASOUND GUIDANCE VASC ACCESS RIGHT MEDICATIONS: Ancef 1 g. The antibiotic was administered within 1 hour of the procedure ANESTHESIA/SEDATION: Moderate (conscious) sedation was employed during this procedure. A total of Versed 1.5 mg and Fentanyl 50 mcg was administered intravenously. Moderate Sedation Time: 80 minutes. The patient's level of consciousness and vital signs were monitored continuously by radiology nursing throughout the procedure under my direct supervision. CONTRAST:  ISOVUE-300 IOPAMIDOL (ISOVUE-300) INJECTION 61% FLUOROSCOPY TIME:  Fluoroscopy Time: 26 minutes 48 seconds (210 mGy). COMPLICATIONS: None immediate. PROCEDURE: Informed consent was obtained from the patient following explanation of the procedure, risks, benefits and alternatives. The patient understands, agrees and consents for the procedure. All questions  were addressed. A time out was performed prior to the initiation of the procedure. Maximal barrier sterile technique utilized including caps, mask, sterile  gowns, sterile gloves, large sterile drape, hand hygiene, and Betadine prep. The right groin was prepped and draped in a sterile fashion. 1% lidocaine was utilized for local anesthesia. Under sonographic guidance, a micropuncture needle was inserted into the right common femoral artery and removed over a 018 wire which was up sized to a Bentson. A 5 French sheath was inserted. A micropuncture needle was then inserted into the right common femoral vein and removed over a 018 wire. A 5 French transitional dilator was inserted for venous access. A 5 French pigtail catheter was advanced into the aorta. Lateral aortography was performed demonstrating patency at the origin of the celiac, SMA, and IMA. A cobra catheter was advanced into the SMA and angiography was performed. This showed no source of gastrointestinal bleeding. The Cobra catheter was exchanged over a Bentson wire for a Sos Omni catheter. The IMA was selected and angiography was performed. Active extravasation at the splenic flexure was noted. A micro catheter was then advanced through the Sos Omni catheter and into the marginal artery of [place]. Several contrast injections were performed. Active extravasation was again identified at the same location, however placement of the catheter into the culprit vessel was not possible. The micro catheter was removed. The Sos was advanced into the aorta and straightened with the Bentson wire. Right femoral angiography was performed demonstrating a high profunda femoral artery takeoff. It was then removed. The sheath was removed and hemostasis was achieved with direct pressure. FINDINGS: Angiography of the SMA demonstrates no source of active bleeding. Selective injection of the IMA demonstrates active contrast extravasation at the splenic flexure. Competitive flow from the middle colicky artery is noted. Several subsequent contrast injections via a micro catheter in the marginal artery of [place] demonstrate active  extravasation at the splenic flexure. This occurs at a small arcade vessel from the branch of the marginal artery of [place] at the watershed zone between the middle and left colloid arteries. IMPRESSION: Diagnostic angiogram demonstrates active contrast extravasation from a small arcade vessel of the left common artery at the splenic flexure. Embolization of this small vessel was unsuccessful as described above. At the end of the procedure, the patient's systolic blood pressure remained above 100 mm Hg with a pulse of 100. This was discussed with Dr. Mikey Bussing of the internal medicine teaching service. Electronically Signed   By: Jolaine Click M.D.   On: 09/23/2015 08:25   Ir Angiogram Selective Each Additional Vessel  09/24/2015  CLINICAL DATA:  Gastrointestinal bleeding EXAM: ADDITIONAL ARTERIOGRAPHY COMPARISON:  None. FINDINGS: Please refer to accession # [id number]. IMPRESSION: Please refer to accession # [id number]. Electronically Signed   By: Jolaine Click M.D.   On: 09/24/2015 08:38   Ir US Guide Vasc Access Right  09/23/2015  INDICATION: Gastrointestinal bleeding. Positive nuclear medicine imaging. Active bleeding from the splenic flexure noted. EXAM: SELECTIVE VISCERAL ARTERIOGRAPHY; IR ULTRASOUND GUIDANCE VASC ACCESS RIGHT MEDICATIONS: Ancef 1 g. The antibiotic was administered within 1 hour of the procedure ANESTHESIA/SEDATION: Moderate (conscious) sedation was employed during this procedure. A total of Versed 1.5 mg and Fentanyl 50 mcg was administered intravenously. Moderate Sedation Time: 80 minutes. The patient's level of consciousness and vital signs were monitored continuously by radiology nursing throughout the procedure under my direct supervision. CONTRAST:  ISOVUE-300 IOPAMIDOL (ISOVUE-300) INJECTION 61% FLUOROSCOPY TIME:  Fluoroscopy Time: 26 minutes 48  seconds (210 mGy). COMPLICATIONS: None immediate. PROCEDURE: Informed consent was obtained from the patient following explanation of  the procedure, risks, benefits and alternatives. The patient understands, agrees and consents for the procedure. All questions were addressed. A time out was performed prior to the initiation of the procedure. Maximal barrier sterile technique utilized including caps, mask, sterile gowns, sterile gloves, large sterile drape, hand hygiene, and Betadine prep. The right groin was prepped and draped in a sterile fashion. 1% lidocaine was utilized for local anesthesia. Under sonographic guidance, a micropuncture needle was inserted into the right common femoral artery and removed over a 018 wire which was up sized to a Bentson. A 5 French sheath was inserted. A micropuncture needle was then inserted into the right common femoral vein and removed over a 018 wire. A 5 French transitional dilator was inserted for venous access. A 5 French pigtail catheter was advanced into the aorta. Lateral aortography was performed demonstrating patency at the origin of the celiac, SMA, and IMA. A cobra catheter was advanced into the SMA and angiography was performed. This showed no source of gastrointestinal bleeding. The Cobra catheter was exchanged over a Bentson wire for a Sos Omni catheter. The IMA was selected and angiography was performed. Active extravasation at the splenic flexure was noted. A micro catheter was then advanced through the Sos Omni catheter and into the marginal artery of [place]. Several contrast injections were performed. Active extravasation was again identified at the same location, however placement of the catheter into the culprit vessel was not possible. The micro catheter was removed. The Sos was advanced into the aorta and straightened with the Bentson wire. Right femoral angiography was performed demonstrating a high profunda femoral artery takeoff. It was then removed. The sheath was removed and hemostasis was achieved with direct pressure. FINDINGS: Angiography of the SMA demonstrates no source of  active bleeding. Selective injection of the IMA demonstrates active contrast extravasation at the splenic flexure. Competitive flow from the middle colicky artery is noted. Several subsequent contrast injections via a micro catheter in the marginal artery of [place] demonstrate active extravasation at the splenic flexure. This occurs at a small arcade vessel from the branch of the marginal artery of [place] at the watershed zone between the middle and left colloid arteries. IMPRESSION: Diagnostic angiogram demonstrates active contrast extravasation from a small arcade vessel of the left common artery at the splenic flexure. Embolization of this small vessel was unsuccessful as described above. At the end of the procedure, the patient's systolic blood pressure remained above 100 mm Hg with a pulse of 100. This was discussed with Dr. Mikey Bussing of the internal medicine teaching service. Electronically Signed   By: Jolaine Click M.D.   On: 09/23/2015 08:25   Dg Chest Portable 1 View  09/22/2015  CLINICAL DATA:  Shortness of breath on exertion with occasional cough. EXAM: PORTABLE CHEST 1 VIEW COMPARISON:  None. FINDINGS: Cardiomegaly. Aortic atherosclerosis. Hyperinflation. No active infiltrates or failure. No effusion or pneumothorax. Osteopenia. IMPRESSION: Cardiomegaly.  No active infiltrates or failure. Electronically Signed   By: Elsie Stain M.D.   On: 09/22/2015 11:52   Medications: I have reviewed the patient's current medications. Scheduled Meds: . antiseptic oral rinse  7 mL Mouth Rinse q12n4p  . chlorhexidine  15 mL Mouth Rinse BID  . insulin aspart  0-9 Units Subcutaneous Q4H  . pantoprazole (PROTONIX) IV  40 mg Intravenous BID   Continuous Infusions:  PRN Meds:. Assessment/Plan: 82 yo F with acute  onset BRBPR and 3 days of non-bloody diarrhea, otherwise asymptomatic.   Acute GI bleed with anemia:  Nuclear medicine scan suggestive of diverticular bleedings vs. AVM. Cannot rule out  malignancy, although less likely. Rate of bleeding has slowed down - rectal tube removed.  Will watch carefully for any changes. GI consulted and recommends watchful waiting - colonoscopy not recommended at this time.  GI surgery amenable to L hemicolectomy if pt begins bleeding briskly again.  -GI and surgery following, appreciate recs  -transfer from stepdown bed to floor bed with tele  -CBC at 6pm today then qday to monitor Hb - stable overnight at 8.0 -discontinue IVF -start clear liquid diet as tolerated  -PT/OT consult to help improve strength -Transfuse if Hb<7  -Continue Protonix   Hypertension  -well-controlled, blood pressures soft during course of hospitalization  -hold amlodipine   DM  -continue insulin  -hold metformin   Arthritis  -hold Meloxicam   RLS  -hold ropinirole   This is a Psychologist, occupationalMedical Student Note.  The care of the patient was discussed with Dr. Valentino NoseNathan Boswell and the assessment and plan formulated with their assistance.  Please see their attached note for official documentation of the daily encounter.   LOS: 2 days   Nathaneil Canaryolleen M Quinnton Bury, Med Student 09/24/2015, 11:11 AM

## 2015-09-24 NOTE — Care Management Note (Signed)
Case Management Note  Patient Details  Name: Ellamay Fors MRN: [id number] Date of Birth: 04/23/1919  Subjective/Objective:   Pt admitted on 09/22/15 with lower GI bleeding.  PTA, pt independent, lives with daughter and other family members.                   Action/Plan: Met with pt to discuss dc planning.  She denies any needs for home at this time.  She states she is quite independent at home.  Will follow for any home needs.    Expected Discharge Date:                  Expected Discharge Plan:  [place]  In-House Referral:     Discharge planning Services  CM Consult  Post Acute Care Choice:    Choice offered to:     DME Arranged:    DME Agency:     HH Arranged:    [place] Agency:     Status of Service:  In process, will continue to follow  Medicare Important Message Given:    Date Medicare IM Given:    Medicare IM give by:    Date Additional Medicare IM Given:    Additional Medicare Important Message give by:     If discussed at [place] of Stay Meetings, dates discussed:    Additional Comments:  Reinaldo Raddle, RN, BSN  Trauma/Neuro ICU Case Manager [phone]

## 2015-09-24 NOTE — Progress Notes (Signed)
  Date: 09/24/2015  Patient name: Sharon Conway  Medical record number: [id number]  Date of birth: 02-14-1919   This patient has been seen and the plan of care was discussed with the house staff. Please see Dr. Bonney RousselBoswell's note for complete details. I concur with his findings.  Additional Plan  HTN Patient with h/o HTN, however, she has been hypotensive given GI bleeding.  Holding home amlodipine  We had a lengthy discussion with GI today, Dr. Evette CristalGanem.  If she does happen to rebleed (at risk given diverticulosis presumed source), she will likely need surgery but the post-op course would be very difficult.  Continue to monitor closely.   Inez CatalinaEmily B Lenisha Lacap, MD 09/24/2015, 12:35 PM

## 2015-09-24 NOTE — Discharge Summary (Signed)
Name: Sharon Conway MRN: [id number] DOB: November 27, 1918 82 y.o. PCP: Pcp Not In System  Date of Admission: 09/22/2015 10:01 AM Date of Discharge: 09/29/2015 Attending Physician: Inez Catalina, MD Discharge Diagnosis:  Active Problems:   GI bleeding   Diabetes mellitus type II, controlled (HCC)   Symptomatic anemia   Lower GI bleed   Gastrointestinal hemorrhage associated with intestinal diverticulitis  Discharge Medications:   Medication List    TAKE these medications        amLODipine 5 MG tablet  Commonly known as:  NORVASC  Take 5 mg by mouth daily.     meloxicam 15 MG tablet  Commonly known as:  MOBIC  Take 15 mg by mouth daily.     metFORMIN 500 MG tablet  Commonly known as:  GLUCOPHAGE  Take 500 mg by mouth 2 (two) times daily with a meal.     pantoprazole 40 MG tablet  Commonly known as:  PROTONIX  Take 1 tablet (40 mg total) by mouth daily.     rOPINIRole 2 MG tablet  Commonly known as:  REQUIP  Take 2 mg by mouth at bedtime.     solifenacin 5 MG tablet  Commonly known as:  VESICARE  Take 5 mg by mouth daily.        Disposition and follow-up:   Sharon Conway was discharged from [facility] in Stable condition.  At the hospital follow up visit please address:  1. Lower GI bleed - IR unable to embolize. Please assess for any recurrent bleeding.   2.  Labs / imaging needed at time of follow-up: CBC, FOBT  3.  Pending labs/ test needing follow-up: none  Follow-up Appointments: Follow-up Information    Follow up with Gwenevere Abbot, MD On 10/11/2015.   Specialty:  Gastroenterology   Why:  9:45 AM   Contact information:   [address] [place] [address] [phone]       Discharge Instructions: Discharge Instructions    Diet - low sodium heart healthy    Complete by:  As directed      Increase activity slowly    Complete by:  As directed            Consultations: Treatment Team:  Graylin Shiver, MD Dorena Cookey, MD  Procedures Performed:  Nm Gi Blood Loss  09/22/2015  CLINICAL DATA:  Active GI bleed. EXAM: NUCLEAR MEDICINE GASTROINTESTINAL BLEEDING SCAN TECHNIQUE: Sequential abdominal images were obtained following intravenous administration of Tc-44m labeled red blood cells. RADIOPHARMACEUTICALS:  25.0 mCi Tc-57m in-vitro labeled red cells. COMPARISON:  None. FINDINGS: There is a tubular focus of increased uptake within the proximal left colon which increases in intensity and exhibits antegrade progression conforming to the expected configuration distal colon. Normal physiologic activity is identified within the liver, GU tract and blood pool. IMPRESSION: 1. Examination is positive for active GI bleed which appears to originate at the level of the splenic flexure. Electronically Signed   By: Signa Kell M.D.   On: 09/22/2015 16:01   Ir Angiogram Visceral Selective  09/23/2015  INDICATION: Gastrointestinal bleeding. Positive nuclear medicine imaging. Active bleeding from the splenic flexure noted. EXAM: SELECTIVE VISCERAL ARTERIOGRAPHY; IR ULTRASOUND GUIDANCE VASC ACCESS RIGHT MEDICATIONS: Ancef 1 g. The antibiotic was administered within 1 hour of the procedure ANESTHESIA/SEDATION: Moderate (conscious) sedation was employed during this procedure. A total of Versed 1.5 mg and Fentanyl 50 mcg was administered intravenously. Moderate Sedation Time: 80 minutes.  The patient's level of consciousness and vital signs were monitored continuously by radiology nursing throughout the procedure under my direct supervision. CONTRAST:  ISOVUE-300 IOPAMIDOL (ISOVUE-300) INJECTION 61% FLUOROSCOPY TIME:  Fluoroscopy Time: 26 minutes 48 seconds (210 mGy). COMPLICATIONS: None immediate. PROCEDURE: Informed consent was obtained from the patient following explanation of the procedure, risks, benefits and alternatives. The patient understands, agrees and consents for the procedure. All questions were addressed. A time out  was performed prior to the initiation of the procedure. Maximal barrier sterile technique utilized including caps, mask, sterile gowns, sterile gloves, large sterile drape, hand hygiene, and Betadine prep. The right groin was prepped and draped in a sterile fashion. 1% lidocaine was utilized for local anesthesia. Under sonographic guidance, a micropuncture needle was inserted into the right common femoral artery and removed over a 018 wire which was up sized to a Bentson. A 5 French sheath was inserted. A micropuncture needle was then inserted into the right common femoral vein and removed over a 018 wire. A 5 French transitional dilator was inserted for venous access. A 5 French pigtail catheter was advanced into the aorta. Lateral aortography was performed demonstrating patency at the origin of the celiac, SMA, and IMA. A cobra catheter was advanced into the SMA and angiography was performed. This showed no source of gastrointestinal bleeding. The Cobra catheter was exchanged over a Bentson wire for a Sos Omni catheter. The IMA was selected and angiography was performed. Active extravasation at the splenic flexure was noted. A micro catheter was then advanced through the Sos Omni catheter and into the marginal artery of [place]. Several contrast injections were performed. Active extravasation was again identified at the same location, however placement of the catheter into the culprit vessel was not possible. The micro catheter was removed. The Sos was advanced into the aorta and straightened with the Bentson wire. Right femoral angiography was performed demonstrating a high profunda femoral artery takeoff. It was then removed. The sheath was removed and hemostasis was achieved with direct pressure. FINDINGS: Angiography of the SMA demonstrates no source of active bleeding. Selective injection of the IMA demonstrates active contrast extravasation at the splenic flexure. Competitive flow from the middle colicky  artery is noted. Several subsequent contrast injections via a micro catheter in the marginal artery of Drum[place] demonstrate active extravasation at the splenic flexure. This occurs at a small arcade vessel from the branch of the marginal artery of Drummond at the watershed zone between the middle and left colloid arteries. IMPRESSION: Diagnostic angiogram demonstrates active contrast extravasation from a small arcade vessel of the left common artery at the splenic flexure. Embolization of this small vessel was unsuccessful as described above. At the end of the procedure, the patient's systolic blood pressure remained above 100 mm Hg with a pulse of 100. This was discussed with Dr. Mikey Bussing of the internal medicine teaching service. Electronically Signed   By: Jolaine Click M.D.   On: 09/23/2015 08:25   Ir Angiogram Visceral Selective  09/23/2015  INDICATION: Gastrointestinal bleeding. Positive nuclear medicine imaging. Active bleeding from the splenic flexure noted. EXAM: SELECTIVE VISCERAL ARTERIOGRAPHY; IR ULTRASOUND GUIDANCE VASC ACCESS RIGHT MEDICATIONS: Ancef 1 g. The antibiotic was administered within 1 hour of the procedure ANESTHESIA/SEDATION: Moderate (conscious) sedation was employed during this procedure. A total of Versed 1.5 mg and Fentanyl 50 mcg was administered intravenously. Moderate Sedation Time: 80 minutes. The patient's level of consciousness and vital signs were monitored continuously by radiology nursing throughout the procedure under  my direct supervision. CONTRAST:  ISOVUE-300 IOPAMIDOL (ISOVUE-300) INJECTION 61% FLUOROSCOPY TIME:  Fluoroscopy Time: 26 minutes 48 seconds (210 mGy). COMPLICATIONS: None immediate. PROCEDURE: Informed consent was obtained from the patient following explanation of the procedure, risks, benefits and alternatives. The patient understands, agrees and consents for the procedure. All questions were addressed. A time out was performed prior to the initiation of  the procedure. Maximal barrier sterile technique utilized including caps, mask, sterile gowns, sterile gloves, large sterile drape, hand hygiene, and Betadine prep. The right groin was prepped and draped in a sterile fashion. 1% lidocaine was utilized for local anesthesia. Under sonographic guidance, a micropuncture needle was inserted into the right common femoral artery and removed over a 018 wire which was up sized to a Bentson. A 5 French sheath was inserted. A micropuncture needle was then inserted into the right common femoral vein and removed over a 018 wire. A 5 French transitional dilator was inserted for venous access. A 5 French pigtail catheter was advanced into the aorta. Lateral aortography was performed demonstrating patency at the origin of the celiac, SMA, and IMA. A cobra catheter was advanced into the SMA and angiography was performed. This showed no source of gastrointestinal bleeding. The Cobra catheter was exchanged over a Bentson wire for a Sos Omni catheter. The IMA was selected and angiography was performed. Active extravasation at the splenic flexure was noted. A micro catheter was then advanced through the Sos Omni catheter and into the marginal artery of Drummond. Several contrast injections were performed. Active extravasation was again identified at the same location, however placement of the catheter into the culprit vessel was not possible. The micro catheter was removed. The Sos was advanced into the aorta and straightened with the Bentson wire. Right femoral angiography was performed demonstrating a high profunda femoral artery takeoff. It was then removed. The sheath was removed and hemostasis was achieved with direct pressure. FINDINGS: Angiography of the SMA demonstrates no source of active bleeding. Selective injection of the IMA demonstrates active contrast extravasation at the splenic flexure. Competitive flow from the middle colicky artery is noted. Several subsequent  contrast injections via a micro catheter in the marginal artery of Drum[place] demonstrate active extravasation at the splenic flexure. This occurs at a small arcade vessel from the branch of the marginal artery of Drum[place] at the watershed zone between the middle and left colloid arteries. IMPRESSION: Diagnostic angiogram demonstrates active contrast extravasation from a small arcade vessel of the left common artery at the splenic flexure. Embolization of this small vessel was unsuccessful as described above. At the end of the procedure, the patient's systolic blood pressure remained above 100 mm Hg with a pulse of 100. This was discussed with Dr. Mikey Bussing of the internal medicine teaching service. Electronically Signed   By: Jolaine Click M.D.   On: 09/23/2015 08:25   Ir Angiogram Selective Each Additional Vessel  09/24/2015  CLINICAL DATA:  Gastrointestinal bleeding EXAM: ADDITIONAL ARTERIOGRAPHY COMPARISON:  None. FINDINGS: Please refer to accession # [id number]. IMPRESSION: Please refer to accession # [id number]. Electronically Signed   By: Jolaine Click M.D.   On: 09/24/2015 08:38   Ir US Guide Vasc Access Right  09/23/2015  INDICATION: Gastrointestinal bleeding. Positive nuclear medicine imaging. Active bleeding from the splenic flexure noted. EXAM: SELECTIVE VISCERAL ARTERIOGRAPHY; IR ULTRASOUND GUIDANCE VASC ACCESS RIGHT MEDICATIONS: Ancef 1 g. The antibiotic was administered within 1 hour of the procedure ANESTHESIA/SEDATION: Moderate (conscious) sedation was employed during this procedure. A total  of Versed 1.5 mg and Fentanyl 50 mcg was administered intravenously. Moderate Sedation Time: 80 minutes. The patient's level of consciousness and vital signs were monitored continuously by radiology nursing throughout the procedure under my direct supervision. CONTRAST:  100mL ISOVUE-300 IOPAMIDOL (ISOVUE-300) INJECTION 61% FLUOROSCOPY TIME:  Fluoroscopy Time: 26 minutes 48 seconds (210 mGy). COMPLICATIONS: None  immediate. PROCEDURE: Informed consent was obtained from the patient following explanation of the procedure, risks, benefits and alternatives. The patient understands, agrees and consents for the procedure. All questions were addressed. A time out was performed prior to the initiation of the procedure. Maximal barrier sterile technique utilized including caps, mask, sterile gowns, sterile gloves, large sterile drape, hand hygiene, and Betadine prep. The right groin was prepped and draped in a sterile fashion. 1% lidocaine was utilized for local anesthesia. Under sonographic guidance, a micropuncture needle was inserted into the right common femoral artery and removed over a 018 wire which was up sized to a Bentson. A 5 French sheath was inserted. A micropuncture needle was then inserted into the right common femoral vein and removed over a 018 wire. A 5 French transitional dilator was inserted for venous access. A 5 French pigtail catheter was advanced into the aorta. Lateral aortography was performed demonstrating patency at the origin of the celiac, SMA, and IMA. A cobra catheter was advanced into the SMA and angiography was performed. This showed no source of gastrointestinal bleeding. The Cobra catheter was exchanged over a Bentson wire for a Sos Omni catheter. The IMA was selected and angiography was performed. Active extravasation at the splenic flexure was noted. A micro catheter was then advanced through the Sos Omni catheter and into the marginal artery of Drum[place]. Several contrast injections were performed. Active extravasation was again identified at the same location, however placement of the catheter into the culprit vessel was not possible. The micro catheter was removed. The Sos was advanced into the aorta and straightened with the Bentson wire. Right femoral angiography was performed demonstrating a high profunda femoral artery takeoff. It was then removed. The sheath was removed and hemostasis was  achieved with direct pressure. FINDINGS: Angiography of the SMA demonstrates no source of active bleeding. Selective injection of the IMA demonstrates active contrast extravasation at the splenic flexure. Competitive flow from the middle colicky artery is noted. Several subsequent contrast injections via a micro catheter in the marginal artery of [place] demonstrate active extravasation at the splenic flexure. This occurs at a small arcade vessel from the branch of the marginal artery of Drummond at the watershed zone between the middle and left colloid arteries. IMPRESSION: Diagnostic angiogram demonstrates active contrast extravasation from a small arcade vessel of the left common artery at the splenic flexure. Embolization of this small vessel was unsuccessful as described above. At the end of the procedure, the patient's systolic blood pressure remained above 100 mm Hg with a pulse of 100. This was discussed with Dr. Mikey BussingHoffman of the internal medicine teaching service. Electronically Signed   By: Jolaine ClickArthur  Hoss M.D.   On: 09/23/2015 08:25   Dg Chest Portable 1 View  09/22/2015  CLINICAL DATA:  Shortness of breath on exertion with occasional cough. EXAM: PORTABLE CHEST 1 VIEW COMPARISON:  None. FINDINGS: Cardiomegaly. Aortic atherosclerosis. Hyperinflation. No active infiltrates or failure. No effusion or pneumothorax. Osteopenia. IMPRESSION: Cardiomegaly.  No active infiltrates or failure. Electronically Signed   By: Elsie StainJohn T Curnes M.D.   On: 09/22/2015 11:52    Admission HPI: Sharon Conway is a pleasant  82 year old female with a past medical history of HTN, DM2, arthritis and RLS who presents to the ED today with complaints of bright red blood per rectum. Patient reports that last night she developed some abdominal cramping pain and discomfort after dinner. Had one episode of diarrhea last night with no blood or melena present. This morning she had an episode of stool incontinence with a grossly bloody bowel  movement. Both bright red blood and blood clots were noted at that time that ran down her legs and stained the carpet. She denies any further abdominal pain since last night. Denies any nausea or vomiting. Takes Meloxicam for arthritis but denies any other NSAID use or aspirin use. Denies any history of ulcers. Does report a remote history of bright red blood per rectum several years ago but resolved on its own without seeing any physicians. Patient reports dyspnea with exertion but unchanged from baseline. Denies any chest pain, sob at rest, vision changes, lightheadedness/dizziness  Hospital Course by problem list:  Acute GI bleed with anemia: Patient with acute onset bright red blood per rectum with clotting. Most likely diverticular bleed in elderly patient. BUN was not elevated suggestive of lower GI bleed. Hgb 8.9 on admission. Unclear baseline but likely from acute blood loss. Tagged RBC scan in the ED showed sigmoid colon bleed. IR with unsuccessful attempt at embolization. Patient received 6 units PRBC during hospitlaization. Bleeding stopped with no further bleeding for >72 hours. GI did not believe colonoscopy would be beneficial at this time. Surgery on board but would be a difficult recovery in a 82 year old patient and patient declined any surgical options. Discharged with follow up with GI. Instructed to return to ED if any further rectal bleeding.  Hypertension: Stable. Borderline soft during hospitalization. Held home medications during hospitalization. Resumed at discharge.   DM: Held home metformin. SSI during hospitalization. Continued home meds at discharge.   Arthritis: Held Meloxicam in setting of acute GI bleed.  Restless leg syndrome: Held home ropinirole. Resumed at discharge.   Discharge Vitals:   BP 115/57 mmHg  Pulse 100  Temp(Src) 98.6 F (37 C) (Oral)  Resp 18  SpO2 99%  Discharge Labs:  Results for orders placed or performed during the hospital encounter of  09/22/15 (from the past 24 hour(s))  BLOOD TRANSFUSION REPORT - SCANNED     Status: None   Collection Time: 10/01/15 10:29 AM   Narrative   Ordered by an unspecified provider.    Signed: Valentino Nose, MD 10/01/2015, 8:27 PM

## 2015-09-25 LAB — HEMOGLOBIN AND HEMATOCRIT, BLOOD
HCT: 19.6 % — ABNORMAL LOW (ref 36.0–46.0)
HCT: 23.8 % — ABNORMAL LOW (ref 36.0–46.0)
HEMOGLOBIN: 6.6 g/dL — AB (ref 12.0–15.0)
Hemoglobin: 8 g/dL — ABNORMAL LOW (ref 12.0–15.0)

## 2015-09-25 LAB — TYPE AND SCREEN
ABO/RH(D): O POS
Antibody Screen: NEGATIVE

## 2015-09-25 LAB — CBC
HEMATOCRIT: 20.2 % — AB (ref 36.0–46.0)
HEMOGLOBIN: 6.8 g/dL — AB (ref 12.0–15.0)
MCH: 29.4 pg (ref 26.0–34.0)
MCHC: 33.7 g/dL (ref 30.0–36.0)
MCV: 87.4 fL (ref 78.0–100.0)
Platelets: 120 10*3/uL — ABNORMAL LOW (ref 150–400)
RBC: 2.31 MIL/uL — ABNORMAL LOW (ref 3.87–5.11)
RDW: 14 % (ref 11.5–15.5)
WBC: 10.4 10*3/uL (ref 4.0–10.5)

## 2015-09-25 LAB — GLUCOSE, CAPILLARY
GLUCOSE-CAPILLARY: 131 mg/dL — AB (ref 65–99)
GLUCOSE-CAPILLARY: 152 mg/dL — AB (ref 65–99)
Glucose-Capillary: 90 mg/dL (ref 65–99)
Glucose-Capillary: 106 mg/dL — ABNORMAL HIGH (ref 65–99)
Glucose-Capillary: 118 mg/dL — ABNORMAL HIGH (ref 65–99)
Glucose-Capillary: 138 mg/dL — ABNORMAL HIGH (ref 65–99)

## 2015-09-25 LAB — PREPARE RBC (CROSSMATCH)

## 2015-09-25 MED ORDER — SODIUM CHLORIDE 0.9 % IV SOLN
Freq: Once | INTRAVENOUS | Status: AC
  Administered 2015-09-25: 11:00:00 via INTRAVENOUS

## 2015-09-25 MED ORDER — SODIUM CHLORIDE 0.9 % IV BOLUS (SEPSIS)
500.0000 mL | Freq: Once | INTRAVENOUS | Status: AC
  Administered 2015-09-25: 500 mL via INTRAVENOUS

## 2015-09-25 NOTE — Progress Notes (Signed)
[facility] Progress Note  Subjective: No stools charted since yesterday. Patient asleep and I did not arouse her.  Objective: Vital signs in last 24 hours: Temp:  [97.8 F (36.6 C)-99.4 F (37.4 C)] 98.6 F (37 C) (05/06 0407) Pulse Rate:  [78-147] 108 (05/06 0407) Resp:  [16-33] 16 (05/06 0407) BP: (95-156)/(34-96) 107/47 mmHg (05/06 0407) SpO2:  [94 %-100 %] 97 % (05/06 0407) Weight change:    WU:[id number]:deferred  Lab Results: Results for orders placed or performed during the hospital encounter of 09/22/15 (from the past 24 hour(s))  Glucose, capillary     Status: Abnormal   Collection Time: 09/24/15  8:13 AM  Result Value Ref Range   Glucose-Capillary 130 (H) 65 - 99 mg/dL  CBC     Status: Abnormal   Collection Time: 09/24/15  8:29 AM  Result Value Ref Range   WBC 10.5 4.0 - 10.5 K/uL   RBC 2.75 (L) 3.87 - 5.11 MIL/uL   Hemoglobin 8.0 (L) 12.0 - 15.0 g/dL   HCT 82.923.8 (L) 56.236.0 - 13.046.0 %   MCV 86.5 78.0 - 100.0 fL   MCH 29.1 26.0 - 34.0 pg   MCHC 33.6 30.0 - 36.0 g/dL   RDW 86.514.8 78.411.5 - 69.615.5 %   Platelets 122 (L) 150 - 400 K/uL  Glucose, capillary     Status: Abnormal   Collection Time: 09/24/15 12:27 PM  Result Value Ref Range   Glucose-Capillary 106 (H) 65 - 99 mg/dL  Glucose, capillary     Status: Abnormal   Collection Time: 09/24/15  4:48 PM  Result Value Ref Range   Glucose-Capillary 137 (H) 65 - 99 mg/dL  CBC     Status: Abnormal   Collection Time: 09/24/15  6:17 PM  Result Value Ref Range   WBC 10.5 4.0 - 10.5 K/uL   RBC 2.39 (L) 3.87 - 5.11 MIL/uL   Hemoglobin 7.0 (L) 12.0 - 15.0 g/dL   HCT 29.520.9 (L) 28.436.0 - 13.246.0 %   MCV 87.4 78.0 - 100.0 fL   MCH 29.3 26.0 - 34.0 pg   MCHC 33.5 30.0 - 36.0 g/dL   RDW 44.014.9 10.211.5 - 72.515.5 %   Platelets 130 (L) 150 - 400 K/uL  Prepare RBC     Status: None   Collection Time: 09/24/15  9:09 PM  Result Value Ref Range   Order Confirmation ORDER PROCESSED BY BLOOD BANK   Glucose, capillary     Status: Abnormal   Collection Time: 09/24/15  9:39 PM  Result Value Ref Range   Glucose-Capillary 187 (H) 65 - 99 mg/dL  Glucose, capillary     Status: Abnormal   Collection Time: 09/25/15 12:19 AM  Result Value Ref Range   Glucose-Capillary 152 (H) 65 - 99 mg/dL  Glucose, capillary     Status: None   Collection Time: 09/25/15  4:24 AM  Result Value Ref Range   Glucose-Capillary 90 65 - 99 mg/dL  CBC     Status: Abnormal (Preliminary result)   Collection Time: 09/25/15  5:39 AM  Result Value Ref Range   WBC 10.4 4.0 - 10.5 K/uL   RBC 2.31 (L) 3.87 - 5.11 MIL/uL   Hemoglobin 6.8 (LL) 12.0 - 15.0 g/dL   HCT 36.620.2 (L) 44.036.0 - 34.746.0 %   MCV 87.4 78.0 - 100.0 fL   MCH 29.4 26.0 - 34.0 pg   MCHC 33.7 30.0 - 36.0 g/dL   RDW 42.514.0 95.611.5 - 38.715.5 %   Platelets PENDING 150 -  400 K/uL    Studies/Results: No results found.    Assessment: Bleeding of splenic flexure presumed colonic diverticular unsuccessful angiography  Plan: Continue to monitor stools and hemoglobin. We'll follow with you.    Trini Christiansen C 09/25/2015, 7:19 AM  Pager [phone] If no answer or after 5 PM call [phone]

## 2015-09-25 NOTE — Progress Notes (Signed)
  Subjective: Had MS changes overnight  Improved with volume  Feels and looks well this am   Objective: Vital signs in last 24 hours: Temp:  [97.8 F (36.6 C)-99.4 F (37.4 C)] 98.6 F (37 C) (05/06 0407) Pulse Rate:  [78-147] 108 (05/06 0407) Resp:  [16-33] 16 (05/06 0407) BP: (95-156)/(34-96) 107/47 mmHg (05/06 0407) SpO2:  [94 %-100 %] 97 % (05/06 0407) Last BM Date: 09/24/15  Intake/Output from previous day: 05/05 0701 - 05/06 0700 In: 504 [I.V.:150; Blood:354] Out: 750 [Urine:750] Intake/Output this shift:    GI: soft, non-tender; bowel sounds normal; no masses,  no organomegaly  Lab Results:   Recent Labs  09/24/15 1817 09/25/15 0539 09/25/15 0701  WBC 10.5 10.4  --   HGB 7.0* 6.8* 6.6*  HCT 20.9* 20.2* 19.6*  PLT 130* 120*  --    BMET  Recent Labs  09/22/15 1048 09/23/15 0539  NA 137 141  K 5.2* 5.0  CL 104 116*  CO2 22 19*  GLUCOSE 148* 128*  BUN 25* 27*  CREATININE 1.05* 0.90  CALCIUM 9.6 6.9*   PT/INR  Recent Labs  09/22/15 1645 09/23/15 0539  LABPROT 14.3 17.6*  INR 1.09 1.44   ABG No results for input(s): PHART, HCO3 in the last 72 hours.  Invalid input(s): PCO2, PO2  Studies/Results: No results found.  Anti-infectives: Anti-infectives    Start     Dose/Rate Route Frequency Ordered Stop   09/22/15 1818  ceFAZolin (ANCEF) 1-5 GM-% IVPB    Comments:  Ludwig LeanLaughlin, Amy   : cabinet override      09/22/15 1818 09/23/15 0629      Assessment/Plan: Patient Active Problem List   Diagnosis Date Noted  . Gastrointestinal hemorrhage associated with intestinal diverticulitis   . GI bleeding 09/22/2015  . Diabetes mellitus type II, controlled (HCC) 09/22/2015  . Symptomatic anemia 09/22/2015  . Bleeding gastrointestinal   . Lower GI bleed   Looks well this am HGB is drifting but denies frequent bloody BM no significant HOTN  Denies abdominal pain May require colectomy depending on how she does over the next 24 - 48 hours  Keep on  clears for now  Transfuse as needed Will follow    LOS: 3 days    Bryce Cheever A. 09/25/2015

## 2015-09-25 NOTE — Progress Notes (Signed)
   Subjective: [name] reports no complaints this morning. Continued to have bleeding overnight per RN, no bleeding this morning. She was hypotensive overnight to 97/34 HR 111, hgb was 7.0, Received 1 unit of blood overnight. hgb this AM 6.8, and 6.6 on repeat. Discussed with family and surgery team about her.   Objective: Vital signs in last 24 hours: Filed Vitals:   09/25/15 0016 09/25/15 0108 09/25/15 0133 09/25/15 0407  BP: 97/34 118/41 95/81 107/47  Pulse: 111 104 104 108  Temp: 98.2 F (36.8 C) 98.4 F (36.9 C) 99.4 F (37.4 C) 98.6 F (37 C)  TempSrc: Oral Axillary Axillary Oral  Resp: 18 18 20 16   SpO2: 100% 100% 98% 97%   Weight change:   Intake/Output Summary (Last 24 hours) at 09/25/15 1053 Last data filed at 09/25/15 0400  Gross per 24 hour  Intake    354 ml  Output    500 ml  Net   -146 ml    General: alert, lying comfortably in bed, NAD, AAOx3 Lungs: normal respiratory effort, no accessory muscle use, normal breath sounds, no crackles, and no wheezes. Heart: normal rate, regular rhythm, no murmur, no gallop, and no rub.  Abdomen: soft, non-tender, normal bowel sounds, no distention, no guarding, no rebound tenderness Pulses: 2+ DP/PT pulses bilaterally Extremities: no edmea Skin: turgor normal and no rashes.  Psych: normal mood and affect  Medications: I have reviewed the patient's current medications. Scheduled Meds: . sodium chloride   Intravenous Once  . insulin aspart  0-9 Units Subcutaneous Q4H  . pantoprazole  40 mg Oral BID   Continuous Infusions:  PRN Meds:. Assessment/Plan:  Acute GI bleed localized to splenic flexure with anemia:  Had bleeding overnight, hgb trending down slowly from 8 yesterday AM to 7.0 in the evening, received 1 unit of blood and today it's 6.6. Discussed her case with the surgery team. They recommend transfusing further and watching carefully how she does over next 24-48 hours. We are trying to avoid surgery which would be  risky for a 82 year old lady. - will transfuse 1 more unit today. Trend hgb. Will transfuse further if <7.0.  - clear liquid for now.  - appreciate GI and surgery assistance.   DM -CBG qac and qhs -SSI-s  Dispo: Disposition is deferred at this time, awaiting improvement of current medical problems.    The patient does have a current PCP (No primary care provider on file.) and [facility].  The patient does not have transportation limitations that hinder transportation to clinic appointments.  .Services Needed at time of discharge: Y = Yes, Blank = No PT:   OT:   RN:   Equipment:   Other:     LOS: 3 days   Hyacinth Meekerasrif Dyamond Tolosa, MD IMTS PGY-1 [phone] 09/25/2015, 10:53 AM

## 2015-09-25 NOTE — Progress Notes (Addendum)
I was notified of new hypotension to 97/34 with HR 111 and possible change in patient mental status compared to previously observed. On exam Sharon Conway was arousable to voice and able to answer basic orientation questions. However she appeared somewhat generalized weak and unable to clearly express understanding of risks, benefits, and indication for blood transfusion at this time. She is not complaining of particular pain, she is tachycardic, and pulmonary exam is clear. There is a fair volume of mixed red and black stool collected per rectal tube but unclear how much is new output. I discussed this intervention by phone with her daughter Kendrick Ranchancy Wirick who agrees with this procedure and that this fits her previously expressed wishes and previous consent for blood transfusions. We will also provide crystalloid for volume resuscitation and reassess her vital signs and condition.

## 2015-09-25 NOTE — Progress Notes (Addendum)
PT Cancellation Note  Patient Details Name: Franco Nonesdna Paone MRN: [id number] DOB: [birth year]   Cancelled Treatment:    Reason Eval/Treat Not Completed: Medical issues which prohibited therapy. Hgb 6.6 after transfusion overnight. Noted ?continued GI bleed and AMS overnight. Will defer evaluation with ?plans for transfusion.   Daelyn Mozer 09/25/2015, 8:13 AM  Pager [phone]

## 2015-09-25 NOTE — Progress Notes (Addendum)
CRITICAL VALUE ALERT  Critical value received:  Hgb 6.8  Date of notification:  09/25/2015  Time of notification:  0645  Critical value read back:Yes.    Nurse who received alert:  Darcel BayleyKristen Hollis Oh, RN  MD notified (1st page):  Rice  Time of first page:  [phone]  Responding MD:  Dimple Caseyice  Time MD responded:  [email or web address] MD ordered repeat H&H.

## 2015-09-25 NOTE — Progress Notes (Signed)
Notified MD that patient's mental status had changed. She was only A&OX1. BP was 97/34 with HR 111. Pt was noted to be lethargic and unable to sign consent form to receive blood. MD came to assess pt and placed order for 500cc bolus of normal saline.  RN administered 500cc bolus, followed by order for blood. Consent for blood was obtained from the patient's daughter, Kendrick Ranchancy Wint, by 2 RN's, Merry ProudKristina Faucette and Darcel BayleyKristen Bion Todorov via the telephone. After pt received normal saline bolus and blood, pt became more alert and oriented. Will continue to monitor and treat per MD orders.

## 2015-09-26 LAB — HEMOGLOBIN AND HEMATOCRIT, BLOOD
HCT: 22.3 % — ABNORMAL LOW (ref 36.0–46.0)
Hemoglobin: 7.4 g/dL — ABNORMAL LOW (ref 12.0–15.0)

## 2015-09-26 LAB — TYPE AND SCREEN
ABO/RH(D): O POS
Antibody Screen: NEGATIVE
UNIT DIVISION: 0
UNIT DIVISION: 0
Unit division: 0
Unit division: 0
Unit division: 0
Unit division: 0

## 2015-09-26 LAB — GLUCOSE, CAPILLARY
GLUCOSE-CAPILLARY: 97 mg/dL (ref 65–99)
Glucose-Capillary: 129 mg/dL — ABNORMAL HIGH (ref 65–99)
Glucose-Capillary: 203 mg/dL — ABNORMAL HIGH (ref 65–99)
Glucose-Capillary: 101 mg/dL — ABNORMAL HIGH (ref 65–99)

## 2015-09-26 LAB — CBC
HEMATOCRIT: 22.2 % — AB (ref 36.0–46.0)
HEMOGLOBIN: 7.5 g/dL — AB (ref 12.0–15.0)
MCH: 29.2 pg (ref 26.0–34.0)
MCHC: 33.8 g/dL (ref 30.0–36.0)
MCV: 86.4 fL (ref 78.0–100.0)
PLATELETS: 147 10*3/uL — AB (ref 150–400)
RBC: 2.57 MIL/uL — AB (ref 3.87–5.11)
RDW: 14.5 % (ref 11.5–15.5)
WBC: 8.1 10*3/uL (ref 4.0–10.5)

## 2015-09-26 NOTE — Progress Notes (Signed)
  Subjective: [name] was seen and examined on morning rounds.  She has no complaints at this time other than mild diffuse abdominal pain from providers palpating her stomach.  She says she only has this pain during palpation, otherwise denies abdominal pain.  No CP, SOB, light-headedness, headaches, or complaints of bleeding.  Vitals were stable overnight and Hb results are pending.  She understands that we are watching her condition closely and that surgery is on board if her condition deteriorates.   Objective: Vital signs in last 24 hours: Filed Vitals:   09/25/15 1410 09/25/15 1528 09/25/15 2159 09/26/15 0614  BP: 113/69 128/60 111/67 137/62  Pulse: 100 112 98 107  Temp: 98.6 F (37 C) 98.8 F (37.1 C) 98.9 F (37.2 C) 99.3 F (37.4 C)  TempSrc: Oral  Oral Oral  Resp:  16 16 17   SpO2: 100% 97% 99% 97%   Weight change:   Intake/Output Summary (Last 24 hours) at 09/26/15 0845 Last data filed at 09/25/15 1609  Gross per 24 hour  Intake    675 ml  Output      0 ml  Net    675 ml   General: pleasant, sitting up in bed Lungs: Regular respiratory effort. Lungs CTAB CV: RRR  Abdomen: Soft, nondistended.  No tenderness to palpation.   Extremities: No edema  Skin: Turgor normal, no rashes    Lab Results: Hb pending    Micro Results: Recent Results (from the past 240 hour(s))  MRSA PCR Screening     Status: None   Collection Time: 09/22/15  9:58 AM  Result Value Ref Range Status   MRSA by PCR NEGATIVE NEGATIVE Final    Comment:        The GeneXpert MRSA Assay (FDA approved for NASAL specimens only), is one component of a comprehensive MRSA colonization surveillance program. It is not intended to diagnose MRSA infection nor to guide or monitor treatment for MRSA infections.    Studies/Results: No results found. Medications: I have reviewed the patient's current medications. Scheduled Meds: . insulin aspart  0-9 Units Subcutaneous Q4H  . pantoprazole  40 mg  Oral BID   Continuous Infusions:  PRN Meds:. Assessment/Plan: 82 yo F with acute onset BRBPR and 3 days of non-bloody diarrhea, otherwise asymptomatic.    Acute GI bleed localized to splenic flexure with anemia:  Bleeding has stopped at this time.  Received 1 unit of PRBCs yesterday afternoon, which bumped Hb up from 6.6 to 8.0.  Continuing to follow Hb levels. GI surgery amenable to L hemicolectomy if pt begins bleeding briskly again.  Will follow closely.   -GI and surgery following, appreciate recs  -continue following Hb  -Transfuse if Hb<7  -transition to full liquid diet as tolerated  -Continue Protonix  -PT/OT consult to help improve strength  Hypertension  -well-controlled, blood pressures soft during course of hospitalization  -hold amlodipine   DM  -CBG qac and qhs -continue sliding scale insulin  -hold metformin   Arthritis  -hold Meloxicam   RLS  -hold ropinirole   This is a Psychologist, occupationalMedical Student Note.  The care of the patient was discussed with Dr. Valentino NoseNathan Boswell and the assessment and plan formulated with their assistance.  Please see their attached note for official documentation of the daily encounter.    LOS: 4 days   Nathaneil Canaryolleen M Sahith Nurse, Med Student 09/26/2015, 8:45 AM

## 2015-09-26 NOTE — Progress Notes (Signed)
Occupational Therapy Evaluation Patient Details Name: Sharon Conway MRN: [id number] DOB: 01-24-1919 Today's Date: 09/26/2015    History of Present Illness Sharon Conway is a 82yo woman with PMH of HTN, DM2, arthritis and RLS who presented to the ED with BRBPR; Acute GI bleed localized to splenic flexure with anemia; surgery team recommend transfusing further and watching carefully how she does over next 24-48 hours. We are trying to avoid surgery which would be risky for a 82 year old lady.   Clinical Impression   Pt admitted with the above diagnoses and presents with below problem list. Pt will benefit from continued acute OT to address the below listed deficits and maximize independence with BADLs prior to d/c to venue below. PTA pt was independent with ADLs. Spoke briefly with pt's grandson who confirms pt lives with his mother (pt's daughter) and has 24 hour assist. Pt currently min guard for ADLs. Will follow.      Follow Up Recommendations  Home health OT;Supervision/Assistance - 24 hour    Equipment Recommendations  3 in 1 bedside comode    Recommendations for Other Services       Precautions / Restrictions Precautions Precautions: Fall      Mobility Bed Mobility Overal bed mobility: Needs Assistance Bed Mobility: Sit to Supine     Supine to sit: Supervision Sit to supine: Supervision   General bed mobility comments: supervision for safety. Pt adjusting linens prior to returning to bed; min guard safety for that.  Transfers Overall transfer level: Needs assistance Equipment used: Rolling walker (2 wheeled) Transfers: Sit to/from Stand Sit to Stand: Min guard         General transfer comment: min guard for safety. from recliner, regular height toilet with grab bars, and EOB. cues for technique with rw.    Balance Overall balance assessment: Needs assistance Sitting-balance support: No upper extremity supported;Feet supported Sitting balance-Leahy Scale: Good      Standing balance support: Bilateral upper extremity supported;During functional activity;No upper extremity supported Standing balance-Leahy Scale: Poor Standing balance comment: static standing is fair, rw used for ambulation. External support sought for dynamic standing tasks.                             ADL Overall ADL's : Needs assistance/impaired Eating/Feeding: Set up;Sitting   Grooming: Wash/dry hands;Set up;Min guard;Sitting;Standing Grooming Details (indicate cue type and reason): used support of sink while standing at sink simulating hand washing Upper Body Bathing: Set up;Sitting   Lower Body Bathing: Min guard;Sit to/from stand   Upper Body Dressing : Set up;Sitting   Lower Body Dressing: Min guard;Sit to/from stand   Toilet Transfer: Min guard;Ambulation;Regular Toilet;Grab bars;RW Statistician Details (indicate cue type and reason): used grab bars. has sink next to toilet at home Toileting- Clothing Manipulation and Hygiene: Set up;Min guard;Sitting/lateral lean;Sit to/from stand     Tub/Shower Transfer Details (indicate cue type and reason): na Functional mobility during ADLs: Min guard;Rolling walker General ADL Comments: Pt completed household distance functional mobility, toilet transfer and simulated grooming task as detailed above. Educated on fall prevention (bag on rw, pet safety as 3 dogs are in the home, etc)      Vision     Perception     Praxis      Pertinent Vitals/Pain Pain Assessment: No/denies pain     Hand Dominance     Extremity/Trunk Assessment Upper Extremity Assessment Upper Extremity Assessment: Generalized weakness  Lower Extremity Assessment Lower Extremity Assessment: Defer to PT evaluation;Generalized weakness       Communication Communication Communication: No difficulties;HOH   Cognition Arousal/Alertness: Awake/alert Behavior During Therapy: WFL for tasks assessed/performed Overall Cognitive Status:  Within Functional Limits for tasks assessed                     General Comments       Exercises       Shoulder Instructions      Home Living Family/patient expects to be discharged to:: Private residence Living Arrangements: Children;Other (Comment) (lives with daughter) Available Help at Discharge: Family;Available 24 hours/day Type of Home: House Home Access: Stairs to enter Entergy CorporationEntrance Stairs-Number of Steps: 12 (steps, then porch or stoop, then more steps) Entrance Stairs-Rails: Right Home Layout: Multi-level Alternate Level Stairs-Number of Steps: 12 Alternate Level Stairs-Rails: Right (need to confirm this) Bathroom Shower/Tub: Other (comment) (sponge baths only now since multiple falls in shower)   Bathroom Toilet: Standard Bathroom Accessibility: Yes How Accessible: Accessible via walker Home Equipment: None   Additional Comments: Pt's adult grandson present upon OT arrival and confirmed pt lives with daughter (his mom) and she has 24 hour assist.       Prior Functioning/Environment Level of Independence: Independent        Comments: prior to this hospitalization, did not use assistive device (per pt). Reports she had a "walking stick" but felt it got in the way and she felt safer without it. Pt reports she no longer does shower transfers, sponge bathes at sink due to multiple falls in tub.    OT Diagnosis: Generalized weakness   OT Problem List: Impaired balance (sitting and/or standing);Decreased activity tolerance;Decreased knowledge of use of DME or AE;Decreased knowledge of precautions   OT Treatment/Interventions: Self-care/ADL training;Therapeutic exercise;DME and/or AE instruction;Energy conservation;Therapeutic activities;Patient/family education;Balance training    OT Goals(Current goals can be found in the care plan section) Acute Rehab OT Goals Patient Stated Goal: Hopes to feel better and go home soon OT Goal Formulation: With patient Time  For Goal Achievement: 10/10/15 Potential to Achieve Goals: Good ADL Goals Pt Will Perform Grooming: with modified independence;sitting;standing (full grooming session) Pt Will Perform Lower Body Bathing: with modified independence;sit to/from stand Pt Will Perform Lower Body Dressing: with modified independence;sit to/from stand Pt Will Transfer to Toilet: with modified independence;ambulating (3n1 over toilet) Pt Will Perform Toileting - Clothing Manipulation and hygiene: with modified independence;sit to/from stand;sitting/lateral leans  OT Frequency: Min 2X/week   Barriers to D/C:            Co-evaluation              End of Session Equipment Utilized During Treatment: Rolling walker  Activity Tolerance: Patient tolerated treatment well Patient left: in bed;with call bell/phone within reach;with bed alarm set   Time: 1610-96041604-1620 OT Time Calculation (min): 16 min Charges:  OT General Charges $OT Visit: 1 Procedure OT Evaluation $OT Eval Low Complexity: 1 Procedure G-Codes:    Pilar GrammesMathews, Brynn Mulgrew H 09/26/2015, 4:36 PM

## 2015-09-26 NOTE — Progress Notes (Signed)
   Subjective: [name] reports no complaints this morning other than some mild abdominal tenderness. States that her abdomen is sore from being palpated so frequently. Reports diffuse tenderness to palpation but denies any pain otherwise. Denies any chest pain, lightheadedness/dizziness, SOB. No further bleeding overnight. Rectal tube is out. No diarrhea.   Objective: Vital signs in last 24 hours: Filed Vitals:   09/25/15 1410 09/25/15 1528 09/25/15 2159 09/26/15 0614  BP: 113/69 128/60 111/67 137/62  Pulse: 100 112 98 107  Temp: 98.6 F (37 C) 98.8 F (37.1 C) 98.9 F (37.2 C) 99.3 F (37.4 C)  TempSrc: Oral  Oral Oral  Resp:  16 16 17   SpO2: 100% 97% 99% 97%   Weight change:   Intake/Output Summary (Last 24 hours) at 09/26/15 0930 Last data filed at 09/25/15 1609  Gross per 24 hour  Intake    675 ml  Output      0 ml  Net    675 ml    General: alert, lying comfortably in bed, NAD, AAOx3 Lungs: normal respiratory effort, no accessory muscle use, normal breath sounds, no crackles, and no wheezes. Heart: normal rate, regular rhythm, no murmur, no gallop, and no rub.  Abdomen: soft, mild diffuse tenderness to palpation, normal bowel sounds, no distention, no guarding, no rebound tenderness Pulses: 2+ DP/PT pulses bilaterally Extremities: no edmea Skin: turgor normal and no rashes.  Psych: normal mood and affect  Medications: I have reviewed the patient's current medications. Scheduled Meds: . insulin aspart  0-9 Units Subcutaneous Q4H  . pantoprazole  40 mg Oral BID   Continuous Infusions:  PRN Meds:. Assessment/Plan:  Acute GI bleed localized to splenic flexure with anemia: No signs of active bleeding overnight. Hgb 7.4 this morning trending down from 8 yesterday evening post transfusion. Vital signs have been stable. Patient complains of some diffuse abdominal tenderness with palpation and attributes it to frequent palpations. Minimally tender on exam today. Surgery  following, recommending watching carefully how she does over next 24-48 hours. We are trying to avoid surgery which would be risky for a 82 year old lady. - Continue to Trend hgb. Will transfuse further if <7.0.  - advance diet as tolerated - appreciate GI and surgery assistance - PT/OT consult  DM: CBGs 118-138 -CBG qac and qhs -SSI-s  Dispo: Disposition is deferred at this time, awaiting improvement of current medical problems.    The patient does have a current PCP (No primary care provider on file.) and [facility].  The patient does not have transportation limitations that hinder transportation to clinic appointments.  .Services Needed at time of discharge: Y = Yes, Blank = No PT:   OT:   RN:   Equipment:   Other:     LOS: 4 days   Sharon NoseNathan Samyra Limb, MD IMTS PGY-1 [phone] 09/26/2015, 9:30 AM

## 2015-09-26 NOTE — Evaluation (Signed)
Physical Therapy Evaluation Patient Details Name: Sharon Conway MRN: [id number] DOB: [birth year] Today's Date: 09/26/2015   History of Present Illness  Sharon Conway is a 82yo woman with PMH of HTN, DM2, arthritis and RLS who presented to the ED with BRBPR; Acute GI bleed localized to splenic flexure with anemia; surgery team recommend transfusing further and watching carefully how she does over next 24-48 hours. We are trying to avoid surgery which would be risky for a 82 year old lady.  Clinical Impression   Pt admitted with above diagnosis. Pt currently with functional limitations due to the deficits listed below (see PT Problem List).  Pt will benefit from skilled PT to increase their independence and safety with mobility to allow discharge to the venue listed below.       Follow Up Recommendations Home health PT;Supervision - Intermittent    Equipment Recommendations  Rolling walker with 5" wheels;3in1 (PT)    Recommendations for Other Services       Precautions / Restrictions Precautions Precautions: Fall      Mobility  Bed Mobility Overal bed mobility: Needs Assistance Bed Mobility: Supine to Sit     Supine to sit: Supervision     General bed mobility comments: Used bedrails to push up; no physical assist needed  Transfers Overall transfer level: Needs assistance Equipment used: 1 person hand held assist Transfers: Sit to/from Stand Sit to Stand: Min assist         General transfer comment: Min assist tosteady; noted she leans aginsit the bed for stability  Ambulation/Gait Ambulation/Gait assistance: Min guard Ambulation Distance (Feet): 150 Feet Assistive device: Rolling walker (2 wheeled);None Gait Pattern/deviations: Step-through pattern;Decreased stride length     General Gait Details: initially walked in room without RW with noted tendency to reach out for UE support; much steadier with RW, cues for upright posture, and RW proximity  Stairs             Wheelchair Mobility    Modified Rankin (Stroke Patients Only)       Balance Overall balance assessment: Needs assistance   Sitting balance-Leahy Scale: Good     Standing balance support: Bilateral upper extremity supported Standing balance-Leahy Scale: Poor                               Pertinent Vitals/Pain Pain Assessment: No/denies pain    Home Living Family/patient expects to be discharged to:: Private residence Living Arrangements: Children;Other (Comment) (plans to stay with her daughter) Available Help at Discharge: Family;Available PRN/intermittently (I believe it is near 24 hour asssit) Type of Home: House Home Access: Stairs to enter Entrance Stairs-Rails: Right Entrance Stairs-Number of Steps: 12 (steps, then porch or stoop, then more steps) Home Layout: Multi-level Home Equipment: None      Prior Function Level of Independence: Independent         Comments: prior to this hospitalization, did not use assistive device (per pt)     Hand Dominance        Extremity/Trunk Assessment   Upper Extremity Assessment: Generalized weakness           Lower Extremity Assessment: Generalized weakness         Communication   Communication: No difficulties  Cognition Arousal/Alertness: Awake/alert Behavior During Therapy: WFL for tasks assessed/performed Overall Cognitive Status: Within Functional Limits for tasks assessed  General Comments General comments (skin integrity, edema, etc.): See doc flowsheets for Orthostatics    Exercises        Assessment/Plan    PT Assessment Patient needs continued PT services  PT Diagnosis Difficulty walking;Generalized weakness   PT Problem List Decreased strength;Decreased activity tolerance;Decreased balance;Decreased mobility;Decreased coordination;Decreased knowledge of use of DME;Decreased knowledge of precautions  PT Treatment Interventions DME  instruction;Gait training;Stair training;Functional mobility training;Therapeutic activities;Therapeutic exercise;Balance training;Patient/family education   PT Goals (Current goals can be found in the Care Plan section) Acute Rehab PT Goals Patient Stated Goal: Hopes to feel better and go home soon PT Goal Formulation: With patient Time For Goal Achievement: 10/10/15 Potential to Achieve Goals: Good    Frequency Min 3X/week   Barriers to discharge   Many steps to enter daughter's home; will need to confirm level of assist available to pt at home    Co-evaluation               End of Session Equipment Utilized During Treatment: Gait belt Activity Tolerance: Patient tolerated treatment well Patient left: in chair;with call bell/phone within reach Nurse Communication: Mobility status         Time: 1419-1440 PT Time Calculation (min) (ACUTE ONLY): 21 min   Charges:   PT Evaluation $PT Eval Moderate Complexity: 1 Procedure     PT G CodesOlen Pel:        Sharon Conway 09/26/2015, 3:13 PM  Van ClinesHolly Leidy Massar, [place]PT  Acute Rehabilitation Services Pager [phone] Office [phone]

## 2015-09-26 NOTE — Progress Notes (Signed)
  Subjective: Pt without complaints  Denies much bleeding out rectum overnight   Objective: Vital signs in last 24 hours: Temp:  [98.6 F (37 C)-99.8 F (37.7 C)] 99.3 F (37.4 C) (05/07 0614) Pulse Rate:  [98-112] 107 (05/07 0614) Resp:  [16-18] 17 (05/07 0614) BP: (105-137)/(35-69) 137/62 mmHg (05/07 0614) SpO2:  [97 %-100 %] 97 % (05/07 0614) Last BM Date: 09/25/15  Intake/Output from previous day: 05/06 0701 - 05/07 0700 In: 675 [P.O.:340; Blood:335] Out: -  Intake/Output this shift:    GI: soft NT ND  Lab Results:   Recent Labs  09/24/15 1817 09/25/15 0539  09/25/15 1650 09/26/15 0752  WBC 10.5 10.4  --   --   --   HGB 7.0* 6.8*  < > 8.0* 7.4*  HCT 20.9* 20.2*  < > 23.8* 22.3*  PLT 130* 120*  --   --   --   < > = values in this interval not displayed. BMET No results for input(s): NA, K, CL, CO2, GLUCOSE, BUN, CREATININE, CALCIUM in the last 72 hours. PT/INR No results for input(s): LABPROT, INR in the last 72 hours. ABG No results for input(s): PHART, HCO3 in the last 72 hours.  Invalid input(s): PCO2, PO2  Studies/Results: No results found.  Anti-infectives: Anti-infectives    Start     Dose/Rate Route Frequency Ordered Stop   09/22/15 1818  ceFAZolin (ANCEF) 1-5 GM-% IVPB    Comments:  Ludwig LeanLaughlin, Amy   : cabinet override      09/22/15 1818 09/23/15 0629      Assessment/Plan: Patient Active Problem List   Diagnosis Date Noted  . Gastrointestinal hemorrhage associated with intestinal diverticulitis   . GI bleeding 09/22/2015  . Diabetes mellitus type II, controlled (HCC) 09/22/2015  . Symptomatic anemia 09/22/2015  . Bleeding gastrointestinal   . Lower GI bleed     Slow drift to HGB represents slow   Bleed at this point  Discussed with other surgeons at [facility] and consensus is that she needs a colonoscopy prior  To any operative intervention.  Given that she is not in shock and not having BRBPR,  This may be amendable to endoscopic means of  treatment   Given advanced age she is at significant risk of complications,  Death   And morbidity  If GI unwilling to provide assistance in this matter,  She may be better served with transfer to tertiary care where endoscopic means available   Would transfuse as needed    LOS: 4 days    Nicholous Girgenti A. 09/26/2015

## 2015-09-27 LAB — GLUCOSE, CAPILLARY
GLUCOSE-CAPILLARY: 129 mg/dL — AB (ref 65–99)
GLUCOSE-CAPILLARY: 160 mg/dL — AB (ref 65–99)
Glucose-Capillary: 135 mg/dL — ABNORMAL HIGH (ref 65–99)
Glucose-Capillary: 134 mg/dL — ABNORMAL HIGH (ref 65–99)
Glucose-Capillary: 92 mg/dL (ref 65–99)

## 2015-09-27 LAB — CBC
HCT: 22.8 % — ABNORMAL LOW (ref 36.0–46.0)
HEMOGLOBIN: 7.5 g/dL — AB (ref 12.0–15.0)
MCH: 29.1 pg (ref 26.0–34.0)
MCHC: 32.9 g/dL (ref 30.0–36.0)
MCV: 88.4 fL (ref 78.0–100.0)
PLATELETS: 193 10*3/uL (ref 150–400)
RBC: 2.58 MIL/uL — ABNORMAL LOW (ref 3.87–5.11)
RDW: 14.6 % (ref 11.5–15.5)
WBC: 6.7 10*3/uL (ref 4.0–10.5)

## 2015-09-27 LAB — HEMOGLOBIN AND HEMATOCRIT, BLOOD
HEMATOCRIT: 23.3 % — AB (ref 36.0–46.0)
HEMOGLOBIN: 7.6 g/dL — AB (ref 12.0–15.0)

## 2015-09-27 MED ORDER — POLYETHYLENE GLYCOL 3350 17 G PO PACK
17.0000 g | PACK | Freq: Three times a day (TID) | ORAL | Status: DC
  Administered 2015-09-27 – 2015-09-29 (×6): 17 g via ORAL
  Filled 2015-09-27 (×6): qty 1

## 2015-09-27 MED ORDER — INSULIN ASPART 100 UNIT/ML ~~LOC~~ SOLN
0.0000 [IU] | Freq: Three times a day (TID) | SUBCUTANEOUS | Status: DC
  Administered 2015-09-27 – 2015-09-28 (×4): 1 [IU] via SUBCUTANEOUS
  Administered 2015-09-28: 2 [IU] via SUBCUTANEOUS
  Administered 2015-09-29 (×2): 1 [IU] via SUBCUTANEOUS

## 2015-09-27 NOTE — Progress Notes (Signed)
  Date: 09/27/2015  Patient name: Sharon Conway  Medical record number: [id number]  Date of birth: Jun 24, 1918   This patient has been seen and the plan of care was discussed with the house staff. Please see Dr. Bonney RousselBoswell's note for complete details. I concur with his findings.  Inez CatalinaEmily B Mullen, MD 09/27/2015, 9:46 PM

## 2015-09-27 NOTE — Progress Notes (Signed)
Subjective: [name] was seen and examined on morning rounds.  She has no complaints overnight and denies any rectal bleeding.  No abdominal pain, SOB, CP, lightheadedness.  Hb has been stable at 7.5 for the past day without need for transfusion.  Discussed with patient potential to discharge tomorrow with close follow-up. PT saw her yesterday and recommended home health upon discharge.   Objective: Vital signs in last 24 hours: Filed Vitals:   09/26/15 1430 09/26/15 1508 09/26/15 2142 09/27/15 0731  BP: 142/55 116/38 128/77 145/67  Pulse:  105 107 98  Temp:  98.2 F (36.8 C) 98.4 F (36.9 C) 97.9 F (36.6 C)  TempSrc:  Oral Oral   Resp:  16 18 20   SpO2:  100% 95% 97%   Weight change:   Intake/Output Summary (Last 24 hours) at 09/27/15 1045 Last data filed at 09/26/15 1906  Gross per 24 hour  Intake    840 ml  Output      0 ml  Net    840 ml   General: pleasant, sitting up in bed Lungs: Regular respiratory effort. Lungs CTAB CV: RRR  Abdomen: Soft, nondistended. No tenderness to palpation.  Extremities: No edema  Skin: Turgor normal, no rashes   Lab Results: CBC    Component Value Date/Time   WBC 6.7 09/27/2015 0559   RBC 2.58* 09/27/2015 0559   HGB 7.5* 09/27/2015 0559   HCT 22.8* 09/27/2015 0559   PLT 193 09/27/2015 0559   MCV 88.4 09/27/2015 0559   MCH 29.1 09/27/2015 0559   MCHC 32.9 09/27/2015 0559   RDW 14.6 09/27/2015 0559   LYMPHSABS 1.1 09/22/2015 1048   MONOABS 0.4 09/22/2015 1048   EOSABS 0.0 09/22/2015 1048   BASOSABS 0.0 09/22/2015 1048     Micro Results: Recent Results (from the past 240 hour(s))  MRSA PCR Screening     Status: None   Collection Time: 09/22/15  9:58 AM  Result Value Ref Range Status   MRSA by PCR NEGATIVE NEGATIVE Final    Comment:        The GeneXpert MRSA Assay (FDA approved for NASAL specimens only), is one component of a comprehensive MRSA colonization surveillance program. It is not intended to diagnose  MRSA infection nor to guide or monitor treatment for MRSA infections.    Studies/Results: No results found. Medications: I have reviewed the patient's current medications. Scheduled Meds: . insulin aspart  0-9 Units Subcutaneous TID WC & HS  . pantoprazole  40 mg Oral BID  . polyethylene glycol  17 g Oral TID   Continuous Infusions:  PRN Meds:. Assessment/Plan: 82 yo F with acute onset BRBPR and 3 days of non-bloody diarrhea, otherwise asymptomatic.   Acute GI bleed localized to splenic flexure with anemia:  No active bleeding for past 36 hours.  Last transfusion was 1.5 days ago- has received a total of 6 units PRBCs during hospitalization.  Hb remains stable at 7.5.  Will continue to trend with possibility of discharge tomorrow if Hb remains stable and no active bleeding.  Surgery has seen pt and insists on colonoscopy before attempting surgery in the case that patient continues to bleed and becomes unstable.   GI medicine is hoping not to attempt colonoscopy. PT recommends discharging with home health.  -GI and surgery following, appreciate recs  -continue following Hb - H&H to be drawn this PM to ensure Hb stable before discharge. Transfuse if Hb<7  -Continue Protonix  -Regular diet   DM  -CBG:  97-203 -CBG qac and qhs -continue sliding scale insulin   This is a Psychologist, occupational Note.  The care of the patient was discussed with Dr. Valentino Nose and the assessment and plan formulated with their assistance.  Please see their attached note for official documentation of the daily encounter.   LOS: 5 days   Sharon Conway, Med Student 09/27/2015, 10:45 AM

## 2015-09-27 NOTE — Progress Notes (Signed)
Patient ID: Franco Nonesdna Demo, female   DOB: 1919/04/16, 82 y.o.   MRN: [id number]     [facility]      [address]   [place], [place] [id number]    Phone: [phone] FAX: [phone]     Subjective: No BMs. H&h stable after blood tx.   Objective:  Vital signs:  Filed Vitals:   09/26/15 1430 09/26/15 1508 09/26/15 2142 09/27/15 0731  BP: 142/55 116/38 128/77 145/67  Pulse:  105 107 98  Temp:  98.2 F (36.8 C) 98.4 F (36.9 C) 97.9 F (36.6 C)  TempSrc:  Oral Oral   Resp:  16 18 20   SpO2:  100% 95% 97%    Last BM Date: 09/26/15  Intake/Output   Yesterday:  05/07 0701 - 05/08 0700 In: 840 [P.O.:840] Out: -  This shift: I/O last 3 completed shifts: In: 840 [P.O.:840] Out: -     Physical Exam: General: Pt awake/alert/oriented x4 in no acute distress Abdomen: Soft.  Nondistended. Non tender.  No evidence of peritonitis.  No incarcerated hernias.    Problem List:   Active Problems:   GI bleeding   Diabetes mellitus type II, controlled (HCC)   Symptomatic anemia   Lower GI bleed   Gastrointestinal hemorrhage associated with intestinal diverticulitis    Results:   Labs: Results for orders placed or performed during the hospital encounter of 09/22/15 (from the past 48 hour(s))  Glucose, capillary     Status: Abnormal   Collection Time: 09/25/15 12:17 PM  Result Value Ref Range   Glucose-Capillary 131 (H) 65 - 99 mg/dL  Hemoglobin and hematocrit, blood     Status: Abnormal   Collection Time: 09/25/15  4:50 PM  Result Value Ref Range   Hemoglobin 8.0 (L) 12.0 - 15.0 g/dL   HCT 40.123.8 (L) 02.736.0 - 25.346.0 %  Glucose, capillary     Status: Abnormal   Collection Time: 09/25/15  5:18 PM  Result Value Ref Range   Glucose-Capillary 118 (H) 65 - 99 mg/dL  Glucose, capillary     Status: Abnormal   Collection Time: 09/25/15 10:03 PM  Result Value Ref Range   Glucose-Capillary 138 (H) 65 - 99 mg/dL  Hemoglobin and  hematocrit, blood     Status: Abnormal   Collection Time: 09/26/15  7:52 AM  Result Value Ref Range   Hemoglobin 7.4 (L) 12.0 - 15.0 g/dL   HCT 66.422.3 (L) 40.336.0 - 47.446.0 %  Glucose, capillary     Status: Abnormal   Collection Time: 09/26/15  8:19 AM  Result Value Ref Range   Glucose-Capillary 129 (H) 65 - 99 mg/dL  Glucose, capillary     Status: Abnormal   Collection Time: 09/26/15 12:30 PM  Result Value Ref Range   Glucose-Capillary 203 (H) 65 - 99 mg/dL  CBC     Status: Abnormal   Collection Time: 09/26/15  3:58 PM  Result Value Ref Range   WBC 8.1 4.0 - 10.5 K/uL    Comment: WHITE COUNT CONFIRMED ON SMEAR   RBC 2.57 (L) 3.87 - 5.11 MIL/uL   Hemoglobin 7.5 (L) 12.0 - 15.0 g/dL   HCT 25.922.2 (L) 56.336.0 - 87.546.0 %   MCV 86.4 78.0 - 100.0 fL   MCH 29.2 26.0 - 34.0 pg   MCHC 33.8 30.0 - 36.0 g/dL   RDW 64.314.5 32.911.5 - 51.815.5 %   Platelets 147 (L) 150 - 400 K/uL  Glucose, capillary     Status:  Abnormal   Collection Time: 09/26/15  5:22 PM  Result Value Ref Range   Glucose-Capillary 101 (H) 65 - 99 mg/dL  Glucose, capillary     Status: None   Collection Time: 09/26/15  8:17 PM  Result Value Ref Range   Glucose-Capillary 97 65 - 99 mg/dL   Comment 1 Notify RN    Comment 2 Document in Chart   Glucose, capillary     Status: Abnormal   Collection Time: 09/27/15 12:03 AM  Result Value Ref Range   Glucose-Capillary 160 (H) 65 - 99 mg/dL   Comment 1 Notify RN    Comment 2 Document in Chart   CBC     Status: Abnormal   Collection Time: 09/27/15  5:59 AM  Result Value Ref Range   WBC 6.7 4.0 - 10.5 K/uL   RBC 2.58 (L) 3.87 - 5.11 MIL/uL   Hemoglobin 7.5 (L) 12.0 - 15.0 g/dL   HCT 29.5 (L) 62.1 - 30.8 %   MCV 88.4 78.0 - 100.0 fL   MCH 29.1 26.0 - 34.0 pg   MCHC 32.9 30.0 - 36.0 g/dL   RDW 65.7 84.6 - 96.2 %   Platelets 193 150 - 400 K/uL  Glucose, capillary     Status: Abnormal   Collection Time: 09/27/15  7:49 AM  Result Value Ref Range   Glucose-Capillary 135 (H) 65 - 99 mg/dL     Imaging / Studies: No results found.  Medications / Allergies:  Scheduled Meds: . insulin aspart  0-9 Units Subcutaneous TID WC & HS  . pantoprazole  40 mg Oral BID  . polyethylene glycol  17 g Oral TID   Continuous Infusions:  PRN Meds:.  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  Appreciate Dr. Teresita Madura recommendations.  We will continue to follow along.    Ashok Norris, [facility] Pager [phone]) For consults and floor pages call [phone](7A-4:30P)  09/27/2015 11:31 AM

## 2015-09-27 NOTE — Progress Notes (Signed)
Pharmacist Student Provided - Patient Medication Education Education Best boyof B1 [facility]ring Team Teaching Service Patient   Education: Educated the patient on some of current medications, specifically pantoprazole and Miralax. Reviewed side effects with the patient. Answered all of the patient's medication specific questions.

## 2015-09-27 NOTE — Progress Notes (Signed)
   Subjective: No complaints this morning. No further bleeding.   Objective: Vital signs in last 24 hours: Filed Vitals:   09/26/15 1508 09/26/15 2142 09/27/15 0731 09/27/15 1408  BP: 116/38 128/77 145/67 158/85  Pulse: 105 107 98 108  Temp: 98.2 F (36.8 C) 98.4 F (36.9 C) 97.9 F (36.6 C) 98.3 F (36.8 C)  TempSrc: Oral Oral  Oral  Resp: 16 18 20 18   SpO2: 100% 95% 97% 99%   Weight change:   Intake/Output Summary (Last 24 hours) at 09/27/15 1653 Last data filed at 09/27/15 1400  Gross per 24 hour  Intake    720 ml  Output      0 ml  Net    720 ml    General: alert, lying comfortably in bed, NAD, AAOx3 Lungs: normal respiratory effort, no accessory muscle use, normal breath sounds, no crackles, and no wheezes. Heart: normal rate, regular rhythm, no murmur, no gallop, and no rub.  Abdomen: soft, mild diffuse tenderness to palpation, normal bowel sounds, no distention, no guarding, no rebound tenderness Pulses: 2+ DP/PT pulses bilaterally Extremities: no edmea Skin: turgor normal and no rashes.  Psych: normal mood and affect  Medications: I have reviewed the patient's current medications. Scheduled Meds: . insulin aspart  0-9 Units Subcutaneous TID WC & HS  . pantoprazole  40 mg Oral BID  . polyethylene glycol  17 g Oral TID   Continuous Infusions:  PRN Meds:. Assessment/Plan:  Acute GI bleed localized to splenic flexure with anemia: No signs of active bleeding for past 36 hours. H/H stable ~7.5. Will continue to monitor. Clear liquid diet and miralax per GI. Monitor for 1-2 days for signs of re bleeding. May need colonoscopy prior to discharge if has rebleeding. Surgery following, no plans for surgery unless acutely decompensates.  - Continue to Trend hgb. Will transfuse further if <7.0.  - clear liquid diet - appreciate GI and surgery assistance - PT -> HH PT  DM: CBGs 92-160 -CBG qac and qhs -SSI-s  Dispo: Disposition is deferred at this time, awaiting  improvement of current medical problems.    The patient does have a current PCP (No primary care provider on file.) and [facility].  The patient does not have transportation limitations that hinder transportation to clinic appointments.  .Services Needed at time of discharge: Y = Yes, Blank = No PT:   OT:   RN:   Equipment:   Other:     LOS: 5 days   Sharon NoseNathan Yardley Lekas, MD IMT[facility] PGY-1 [phone] 09/27/2015, 4:53 PM

## 2015-09-27 NOTE — Progress Notes (Signed)
[facility] PROGRESS NOTE Subjective patient and family report that she did not pass any stools overnight. Denies any abdominal pain. She apparently is back on solid food.  Objective: Vital signs in last 24 hours: Temp:  [97.9 F (36.6 C)-98.4 F (36.9 C)] 97.9 F (36.6 C) (05/08 0731) Pulse Rate:  [98-107] 98 (05/08 0731) Resp:  [16-20] 20 (05/08 0731) BP: (116-145)/(38-77) 145/67 mmHg (05/08 0731) SpO2:  [95 %-100 %] 97 % (05/08 0731) Last BM Date: 09/26/15  Intake/Output from previous day: 05/07 0701 - 05/08 0700 In: 840 [P.O.:840] Out: -  Intake/Output this shift:    PE: General-- alert and oriented  Lungs-- clear  Abdomen-- soft and nontender  Lab Results:  Recent Labs  09/24/15 0829 09/24/15 1817 09/25/15 0539 09/25/15 0701 09/25/15 1650 09/26/15 0752 09/26/15 1558 09/27/15 0559  WBC 10.5 10.5 10.4  --   --   --  8.1 6.7  HGB 8.0* 7.0* 6.8* 6.6* 8.0* 7.4* 7.5* 7.5*  HCT 23.8* 20.9* 20.2* 19.6* 23.8* 22.3* 22.2* 22.8*  PLT 122* 130* 120*  --   --   --  147* 193   BMET No results for input(s): NA, K, CL, CO2, CREATININE in the last 72 hours. LFT No results for input(s): PROT, AST, ALT, ALKPHOS, BILITOT, BILIDIR, IBILI in the last 72 hours. PT/INR No results for input(s): LABPROT, INR in the last 72 hours. PANCREAS No results for input(s): LIPASE in the last 72 hours.       Studies/Results: No results found.  Medications: I have reviewed the patient's current medications.  Assessment/Plan: 1. Lower G.I. bleed. Angiography isolated site as splenic flexure bleed but embolization was unsuccessful. Fortunately it appears that the patient has slowed down considerably. She has not had a transfusion now in 2 days and her hemoglobin has been stable at 7.5.  Plan: Agree with Dr. Luisa Hartornett that patient would be at high risk for surgery given her advanced age. She would also be at high risk for colonoscopy with attempt at control of bleeding but  this would be preferable to surgery. My suggestion is that we start Miralax to begin to clean out her colon and change her to liquid diet. Hopefully her stools will clear and she will not need any specific therapy at all. If not, would attempt colonoscopy with hercolon relatively clean. Colonoscopy in the face of active bleeding is proven to not be beneficial since it is impossible to visualize bleeding site. Have discussed this with patient and family.   Ithiel Liebler JR,Sakia Schrimpf L 09/27/2015, 7:38 AM  This note was created using voice recognition software. Minor errors may Have occurred unintentionally.  Pager: [phone] If no answer or after hours call [phone]

## 2015-09-28 LAB — BASIC METABOLIC PANEL
Anion gap: 8 (ref 5–15)
CHLORIDE: 103 mmol/L (ref 101–111)
CO2: 31 mmol/L (ref 22–32)
CREATININE: 0.77 mg/dL (ref 0.44–1.00)
Calcium: 8.9 mg/dL (ref 8.9–10.3)
GFR calc Af Amer: >60 mL/min (ref >60)
GFR calc non Af Amer: >60 mL/min (ref >60)
Glucose, Bld: 113 mg/dL — ABNORMAL HIGH (ref 65–99)
Potassium: 4.3 mmol/L (ref 3.5–5.1)
Sodium: 142 mmol/L (ref 135–145)

## 2015-09-28 LAB — CBC WITH DIFFERENTIAL/PLATELET
Basophils Absolute: 0 10*3/uL (ref 0.0–0.1)
Basophils Relative: 0 %
EOS ABS: 0.3 10*3/uL (ref 0.0–0.7)
Eosinophils Relative: 3 %
HEMATOCRIT: 26.8 % — AB (ref 36.0–46.0)
HEMOGLOBIN: 8.7 g/dL — AB (ref 12.0–15.0)
LYMPHS ABS: 1.5 10*3/uL (ref 0.7–4.0)
Lymphocytes Relative: 21 %
MCH: 29.8 pg (ref 26.0–34.0)
MCHC: 32.5 g/dL (ref 30.0–36.0)
MCV: 91.8 fL (ref 78.0–100.0)
MONO ABS: 0.6 10*3/uL (ref 0.1–1.0)
MONOS PCT: 9 %
NEUTROS ABS: 4.9 10*3/uL (ref 1.7–7.7)
NEUTROS PCT: 67 %
Platelets: 272 10*3/uL (ref 150–400)
RBC: 2.92 MIL/uL — ABNORMAL LOW (ref 3.87–5.11)
RDW: 14.8 % (ref 11.5–15.5)
WBC: 7.3 10*3/uL (ref 4.0–10.5)

## 2015-09-28 LAB — GLUCOSE, CAPILLARY
Glucose-Capillary: 168 mg/dL — ABNORMAL HIGH (ref 65–99)
Glucose-Capillary: 139 mg/dL — ABNORMAL HIGH (ref 65–99)
Glucose-Capillary: 96 mg/dL (ref 65–99)
Glucose-Capillary: 100 mg/dL — ABNORMAL HIGH (ref 65–99)

## 2015-09-28 NOTE — Progress Notes (Signed)
Occupational Therapy Treatment Patient Details Name: Sharon Conway MRN: [id number] DOB: 1918-08-15 Today's Date: 09/28/2015    History of present illness Sharon Conway is a 82yo woman with PMH of HTN, DM2, arthritis and RLS who presented to the ED with BRBPR; Acute GI bleed localized to splenic flexure with anemia; surgery team recommend transfusing further and watching carefully how she does over next 24-48 hours. We are trying to avoid surgery which would be risky for a 82 year old lady.   OT comments  Patient progressing towards OT goals, continue plan of care for now. Pt with decreased RW safety and awareness and requires multimodal cueing for functional mobility safety.     Follow Up Recommendations  Home health OT;Supervision/Assistance - 24 hour    Equipment Recommendations  3 in 1 bedside comode    Recommendations for Other Services  None at this time   Precautions / Restrictions Precautions Precautions: Fall Restrictions Weight Bearing Restrictions: No    Mobility Bed Mobility General bed mobility comments: Pt found seated in recliner upon OT entering/exiting room   Transfers Overall transfer level: Needs assistance Equipment used: Rolling walker (2 wheeled) Transfers: Sit to/from Stand Sit to Stand: Min guard  General transfer comment: min guard for safety; cues for hand placement    Balance Overall balance assessment: Needs assistance Sitting-balance support: No upper extremity supported;Feet supported Sitting balance-Leahy Scale: Good     Standing balance support: No upper extremity supported;During functional activity Standing balance-Leahy Scale: Poor Standing balance comment: Dynamic standing at sink with no UE support. Pt does best with UE support during functional moiblity using RW.    ADL Overall ADL's : Needs assistance/impaired     Grooming: Wash/dry hands;Set up;Min guard;Standing;Oral care Grooming Details (indicate cue type and reason): standing at sink.  Pt required cueing for safety.  General ADL Comments: Pt found seated in recliner upon OT entering room. Pt willing to work with therapist. Pt stood with RW and ambulated around room, requiring multimodal cueing for safety with RW. Pt then ambualted out in hallway and around the nurses station. Pt then stood at sink for grooming tasks of brushing teeth and washing hands. Pt requested to spit in toilet, and walked to toilet with no AD. Pt states she didn't use a RW PTA and pt with unsafe RW awareness.       Cognition   Behavior During Therapy: WFL for tasks assessed/performed Overall Cognitive Status: Within Functional Limits for tasks assessed                  Pertinent Vitals/ Pain       Pain Assessment: No/denies pain   Frequency Min 2X/week     Progress Toward Goals  OT Goals(current goals can now befound in the care plan section)  Progress towards OT goals: Progressing toward goals  Acute Rehab OT Goals Patient Stated Goal: go home OT Goal Formulation: With patient Time For Goal Achievement: 10/10/15 Potential to Achieve Goals: Good  Plan Discharge plan remains appropriate    End of Session Equipment Utilized During Treatment: Rolling walker;Gait belt   Activity Tolerance Patient tolerated treatment well   Patient Left in chair;with call bell/phone within reach;with chair alarm set;with SCD's reapplied    Time: [id number] OT Time Calculation (min): 18 min  Charges: OT General Charges $OT Visit: 1 Procedure OT Treatments $Self Care/Home Management : 8-22 mins  Edwin CapPatricia Kincaid Tiger , MS, OTR/L, CLT Pager: [phone]  09/28/2015, 2:39 PM

## 2015-09-28 NOTE — Progress Notes (Signed)
  Date: 09/28/2015  Patient name: Sharon Conway  Medical record number: [id number]  Date of birth: 10-07-1918   This patient has been seen and the plan of care was discussed with the house staff. Please see Dr. Bonney RousselBoswell's note for complete details. I concur with his findings.  Inez CatalinaEmily B Rykin Route, MD 09/28/2015, 8:01 PM

## 2015-09-28 NOTE — Progress Notes (Signed)
Subjective: [name] was seen and examined on morning rounds. She has no complaints overnight and denies any rectal bleeding. No abdominal pain, SOB, CP, lightheadedness.  Started on Miralax yesterday, no BM as of this morning. Hb has been stable for past 2.5 days, currently Hb of 8.7.  Will keep on clear liquid diet and Miralax today, with plans to discharge tomorrow if no further bleeding.    Objective: Vital signs in last 24 hours: Filed Vitals:   09/26/15 2142 09/27/15 0731 09/27/15 1408 09/28/15 0500  BP: 128/77 145/67 158/85 127/48  Pulse: 107 98 108 78  Temp: 98.4 F (36.9 C) 97.9 F (36.6 C) 98.3 F (36.8 C) 98.2 F (36.8 C)  TempSrc: Oral  Oral Oral  Resp: 18 20 18 16   SpO2: 95% 97% 99% 100%   Weight change:   Intake/Output Summary (Last 24 hours) at 09/28/15 1148 Last data filed at 09/28/15 1100  Gross per 24 hour  Intake    600 ml  Output    603 ml  Net     -3 ml      General: pleasant, sitting up in bed Lungs: Regular respiratory effort. Lungs CTAB CV: RRR  Abdomen: Soft, nondistended. No tenderness to palpation.  Extremities: No edema  Skin: Turgor normal, no rashes          Lab Results: CBC    Component Value Date/Time   WBC 7.3 09/28/2015 0636   RBC 2.92* 09/28/2015 0636   HGB 8.7* 09/28/2015 0636   HCT 26.8* 09/28/2015 0636   PLT 272 09/28/2015 0636   MCV 91.8 09/28/2015 0636   MCH 29.8 09/28/2015 0636   MCHC 32.5 09/28/2015 0636   RDW 14.8 09/28/2015 0636   LYMPHSABS 1.5 09/28/2015 0636   MONOABS 0.6 09/28/2015 0636   EOSABS 0.3 09/28/2015 0636   BASOSABS 0.0 09/28/2015 0636   BMP Latest Ref Rng 09/28/2015  Glucose 65 - 99 mg/dL 409(W113(H)  BUN 6 - 20 mg/dL <1(X<5(L)  Creatinine 9.140.44 - 1.00 mg/dL 7.820.77  Sodium 956135 - 213145 mmol/L 142  Potassium 3.5 - 5.1 mmol/L 4.3  Chloride 101 - 111 mmol/L 103  CO2 22 - 32 mmol/L 31  Calcium 8.9 - 10.3 mg/dL 8.9     Micro Results: Recent Results (from the past 240 hour(s))  MRSA PCR Screening      Status: None   Collection Time: 09/22/15  9:58 AM  Result Value Ref Range Status   MRSA by PCR NEGATIVE NEGATIVE Final    Comment:        The GeneXpert MRSA Assay (FDA approved for NASAL specimens only), is one component of a comprehensive MRSA colonization surveillance program. It is not intended to diagnose MRSA infection nor to guide or monitor treatment for MRSA infections.    Studies/Results: No results found. Medications: I have reviewed the patient's current medications. Scheduled Meds: . insulin aspart  0-9 Units Subcutaneous TID WC & HS  . pantoprazole  40 mg Oral BID  . polyethylene glycol  17 g Oral TID   Continuous Infusions:  PRN Meds:. Assessment/Plan: 82 yo F with acute onset BRBPR and 3 days of non-bloody diarrhea, otherwise asymptomatic.   Acute GI bleed localized to splenic flexure with anemia:   No active bleeding for past 3 days. Last transfusion was 2.5 days ago- has received a total of 6 units PRBCs during hospitalization. Hb 8.9, up from 7.5 yesterday.  Consulted GI who recommends keeping her on clears + Miralax for today and discharging  tomorrow if no bleeding. Will scope is bleeding recurs.  -GI and surgery following, appreciate recs  -Follow Hb - if continues to be stable in AM will discharge tomorrow  -Clear liquid diet -Miralax to stimulate BM   -Continue Protonix  -Will likely discharge tomorrow with HH   DM  -CBG: 92-168 -CBG qac and qhs -continue sliding scale insulin    This is a Psychologist, occupational Note.  The care of the patient was discussed with Dr. Valentino Nose and the assessment and plan formulated with their assistance. Please see their attached note for official documentation of the daily encounter.   LOS: 6 days   Nathaneil Canary, Med Student 09/28/2015, 11:48 AM

## 2015-09-28 NOTE — Consult Note (Signed)
[facility] Progress Note     Subjective: No GI bleeding overnight, she says its been several days without bleeding (?Friday?).  No N/V, no abdominal pain.  Sitting up in bed.  Tolerating clears and prep.  Only clear watery BM's.  She says "where's my colon".    Objective: Vital signs in last 24 hours: Temp:  [98.2 F (36.8 C)-98.3 F (36.8 C)] 98.2 F (36.8 C) (05/09 0500) Pulse Rate:  [78-108] 78 (05/09 0500) Resp:  [16-18] 16 (05/09 0500) BP: (127-158)/(48-85) 127/48 mmHg (05/09 0500) SpO2:  [99 %-100 %] 100 % (05/09 0500) Last BM Date: 09/26/15  Intake/Output from previous day: 05/08 0701 - 05/09 0700 In: 600 [P.O.:600] Out: 602 [Urine:601; Stool:1] Intake/Output this shift:    PE: Gen:  Alert, NAD, pleasant Card:  RRR, no M/G/R heard Pulm:  CTA, no W/R/R Abd: Soft, NT/ND, +BS, no HSM   Lab Results:   Recent Labs  09/26/15 1558 09/27/15 0559 09/27/15 1822  WBC 8.1 6.7  --   HGB 7.5* 7.5* 7.6*  HCT 22.2* 22.8* 23.3*  PLT 147* 193  --    BMET No results for input(s): NA, K, CL, CO2, GLUCOSE, BUN, CREATININE, CALCIUM in the last 72 hours. PT/INR No results for input(s): LABPROT, INR in the last 72 hours. CMP     Component Value Date/Time   NA 141 09/23/2015 0539   K 5.0 09/23/2015 0539   CL 116* 09/23/2015 0539   CO2 19* 09/23/2015 0539   GLUCOSE 128* 09/23/2015 0539   BUN 27* 09/23/2015 0539   CREATININE 0.90 09/23/2015 0539   CALCIUM 6.9* 09/23/2015 0539   GFRNONAA 53* 09/23/2015 0539   GFRAA >60 09/23/2015 0539   Lipase  No results found for: LIPASE     Studies/Results: No results found.  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 H/o GI bleed 7 years ago -Dr. Leone PayorGessner and Dr.  Randa EvensEdwards following.  Getting colon prep in case colonoscopy needs to be performed if bleeding recurs.   -Clear liquids during prep -We will continue to follow along, hopefully this will resolve conservatively without having to perform a left hemicolectomy -Ambulate and IS -SCD's and VTE proph held currently due to bleeding   LOS: 6 days    Nonie HoyerMegan N Alina Gilkey 09/28/2015, 8:13 AM Pager: [phone]  (7am - 4:30pm M-F; 7am - 11:30am Sa/Su)

## 2015-09-28 NOTE — Progress Notes (Signed)
CM spoke with pt and daughter Harriett Sineancy in regard to recommendations from PT AND OT. Pt declined home health services and  DME recommended. Daughter stated she will help mom once d/c.  Gae Gallopngela Montez Stryker RN,BSN,CM [phone]

## 2015-09-28 NOTE — Progress Notes (Signed)
   Subjective: No complaints this morning. No further bleeding.   Objective: Vital signs in last 24 hours: Filed Vitals:   09/27/15 0731 09/27/15 1408 09/28/15 0500 09/28/15 1500  BP: 145/67 158/85 127/48 139/68  Pulse: 98 108 78 94  Temp: 97.9 F (36.6 C) 98.3 F (36.8 C) 98.2 F (36.8 C) 98.1 F (36.7 C)  TempSrc:  Oral Oral Oral  Resp: 20 18 16 16   SpO2: 97% 99% 100% 100%   Weight change:   Intake/Output Summary (Last 24 hours) at 09/28/15 1633 Last data filed at 09/28/15 1100  Gross per 24 hour  Intake    240 ml  Output    603 ml  Net   -363 ml    General: alert, lying comfortably in bed, NAD, AAOx3 Lungs: normal respiratory effort, no accessory muscle use, normal breath sounds, no crackles, and no wheezes. Heart: normal rate, regular rhythm, no murmur, no gallop, and no rub.  Abdomen: soft, mild diffuse tenderness to palpation, normal bowel sounds, no distention, no guarding, no rebound tenderness Pulses: 2+ DP/PT pulses bilaterally Extremities: no edmea Skin: turgor normal and no rashes.  Psych: normal mood and affect  Medications: I have reviewed the patient's current medications. Scheduled Meds: . insulin aspart  0-9 Units Subcutaneous TID WC & HS  . pantoprazole  40 mg Oral BID  . polyethylene glycol  17 g Oral TID   Continuous Infusions:  PRN Meds:. Assessment/Plan:  Acute GI bleed localized to splenic flexure with anemia: No signs of active bleeding for past 36 hours. H/H stable, improved today at 8.7.  Will continue to monitor. Clear liquid diet and miralax per GI. Monitor for 1-2 days for signs of re bleeding. May need colonoscopy prior to discharge if has rebleeding. Surgery following, no plans for surgery unless acutely decompensates.  - Continue to Trend hgb. Will transfuse further if <7.0.  - clear liquid diet - miralax - appreciate GI and surgery assistance - PT -> HH PT - Anticipate d/c tomorrow  DM: CBGs 129-168 -CBG qac and  qhs -SSI-s  Dispo: Disposition is deferred at this time, awaiting improvement of current medical problems.    The patient does have a current PCP (No primary care provider on file.) and [facility].  The patient does not have transportation limitations that hinder transportation to clinic appointments.  .Services Needed at time of discharge: Y = Yes, Blank = No PT:   OT:   RN:   Equipment:   Other:     LOS: 6 days   Valentino NoseNathan Ramsey Guadamuz, MD IMTS PGY-1 [phone] 09/28/2015, 4:33 PM

## 2015-09-28 NOTE — Progress Notes (Signed)
Physical Therapy Treatment Patient Details Name: Sharon Conway MRN: [id number] DOB: 11/03/1918 Today's Date: 09/28/2015    History of Present Illness Sharon Conway is a 82yo woman with PMH of HTN, DM2, arthritis and RLS who presented to the ED with BRBPR; Acute GI bleed localized to splenic flexure with anemia; surgery team recommend transfusing further and watching carefully how she does over next 24-48 hours. We are trying to avoid surgery which would be risky for a 82 year old lady.    PT Comments    Patient with no c/o pain and tolerated session well. Patient needs to practice stairs next session.   Current plan remains appropriate.   Follow Up Recommendations  Home health PT;Supervision - Intermittent     Equipment Recommendations  Rolling walker with 5" wheels;3in1 (PT)    Recommendations for Other Services       Precautions / Restrictions Precautions Precautions: Fall    Mobility  Bed Mobility Overal bed mobility: Needs Assistance Bed Mobility: Supine to Sit     Supine to sit: Supervision     General bed mobility comments: increased time and min use of bed rail  Transfers Overall transfer level: Needs assistance Equipment used: Rolling walker (2 wheeled) Transfers: Sit to/from Stand Sit to Stand: Min guard         General transfer comment: min guard for safety; cues for hand placement  Ambulation/Gait Ambulation/Gait assistance: Min guard;Min assist Ambulation Distance (Feet): 200 Feet Assistive device: Rolling walker (2 wheeled);None Gait Pattern/deviations: Step-through pattern;Decreased stride length;Wide base of support     General Gait Details: min guard with use of RW and min A without AD with pt unsteady; cues for cadenece, safe use of AD, and step length; instructed pt to use RW at d/c for safety   Stairs            Wheelchair Mobility    Modified Rankin (Stroke Patients Only)       Balance Overall balance assessment: Needs assistance   Sitting balance-Leahy Scale: Good     Standing balance support: During functional activity Standing balance-Leahy Scale: Poor                      Cognition Arousal/Alertness: Awake/alert Behavior During Therapy: WFL for tasks assessed/performed Overall Cognitive Status: Within Functional Limits for tasks assessed                      Exercises General Exercises - Lower Extremity Ankle Circles/Pumps: AROM;Both;20 reps;Seated Long Arc Quad: AROM;Both;20 reps;Seated Hip ABduction/ADduction: AROM;Both;20 reps;Seated Hip Flexion/Marching: AROM;Both;20 reps;Seated    General Comments        Pertinent Vitals/Pain Pain Assessment: No/denies pain    Home Living                      Prior Function            PT Goals (current goals can now be found in the care plan section) Acute Rehab PT Goals Patient Stated Goal: go home PT Goal Formulation: With patient Time For Goal Achievement: 10/10/15 Potential to Achieve Goals: Good Progress towards PT goals: Progressing toward goals    Frequency  Min 3X/week    PT Plan Current plan remains appropriate    Co-evaluation             End of Session Equipment Utilized During Treatment: Gait belt Activity Tolerance: Patient tolerated treatment well Patient left: in chair;with call bell/phone within  reach;with chair alarm set     Time: [phone] PT Time Calculation (min) (ACUTE ONLY): 30 min  Charges:  $Gait Training: 8-22 mins $Therapeutic Exercise: 8-22 mins                    G Codes:      Derek Mound, PTA Pager: [phone]   09/28/2015, 9:36 AM

## 2015-09-28 NOTE — Progress Notes (Signed)
[facility] PROGRESS NOTE Subjective No bleeding or stool overnight. Tolerating miralax  Objective: Vital signs in last 24 hours: Temp:  [97.9 F (36.6 C)-98.3 F (36.8 C)] 98.2 F (36.8 C) (05/09 0500) Pulse Rate:  [78-108] 78 (05/09 0500) Resp:  [16-20] 16 (05/09 0500) BP: (127-158)/(48-85) 127/48 mmHg (05/09 0500) SpO2:  [97 %-100 %] 100 % (05/09 0500) Last BM Date: 09/26/15  Intake/Output from previous day: 05/08 0701 - 05/09 0700 In: 600 [P.O.:600] Out: 602 [Urine:601; Stool:1] Intake/Output this shift:    PE: General--alert   Abdomen--soft nontender good BSs Lab Results:  Recent Labs  09/25/15 1650 09/26/15 0752 09/26/15 1558 09/27/15 0559 09/27/15 1822  WBC  --   --  8.1 6.7  --   HGB 8.0* 7.4* 7.5* 7.5* 7.6*  HCT 23.8* 22.3* 22.2* 22.8* 23.3*  PLT  --   --  147* 193  --    BMET No results for input(s): NA, K, CL, CO2, CREATININE in the last 72 hours. LFT No results for input(s): PROT, AST, ALT, ALKPHOS, BILITOT, BILIDIR, IBILI in the last 72 hours. PT/INR No results for input(s): LABPROT, INR in the last 72 hours. PANCREAS No results for input(s): LIPASE in the last 72 hours.       Studies/Results: No results found.  Medications: I have reviewed the patient's current medications.  Assessment/Plan: 1. LGI  Bleed. Source at [facility] did not stop initially with attempted emboliztion/angio but seems to have stopped now. Will continue clears and miralax and "clean out" the colon. ? Then if attempt colonoscopy ? Ablate AVM if seen. All would have have risks of perforation etc requiring surgery vs watching Will discuss with surgery.   Hyla Coard JR,Johnelle Tafolla L 09/28/2015, 7:25 AM  This note was created using voice recognition software. Minor errors may Have occurred unintentionally.  Pager: [phone] If no answer or after hours call [phone]

## 2015-09-29 ENCOUNTER — Encounter (HOSPITAL_COMMUNITY): Payer: Self-pay | Admitting: Emergency Medicine

## 2015-09-29 ENCOUNTER — Inpatient Hospital Stay (HOSPITAL_COMMUNITY)
Admission: EM | Admit: 2015-09-29 | Discharge: 2015-10-03 | DRG: 392 | Disposition: A | Discharge status: Home / Self Care | Payer: Medicare Other | Attending: Internal Medicine | Admitting: Internal Medicine

## 2015-09-29 DIAGNOSIS — Z7984 Long term (current) use of oral hypoglycemic drugs: Secondary | ICD-10-CM

## 2015-09-29 DIAGNOSIS — K921 Melena: Secondary | ICD-10-CM | POA: Diagnosis not present

## 2015-09-29 DIAGNOSIS — D62 Acute posthemorrhagic anemia: Secondary | ICD-10-CM | POA: Diagnosis not present

## 2015-09-29 DIAGNOSIS — Z791 Long term (current) use of non-steroidal anti-inflammatories (NSAID): Secondary | ICD-10-CM

## 2015-09-29 DIAGNOSIS — E440 Moderate protein-calorie malnutrition: Secondary | ICD-10-CM | POA: Insufficient documentation

## 2015-09-29 DIAGNOSIS — E119 Type 2 diabetes mellitus without complications: Secondary | ICD-10-CM

## 2015-09-29 DIAGNOSIS — K5730 Diverticulosis of large intestine without perforation or abscess without bleeding: Secondary | ICD-10-CM | POA: Diagnosis not present

## 2015-09-29 DIAGNOSIS — M1990 Unspecified osteoarthritis, unspecified site: Secondary | ICD-10-CM | POA: Diagnosis not present

## 2015-09-29 DIAGNOSIS — K922 Gastrointestinal hemorrhage, unspecified: Secondary | ICD-10-CM | POA: Diagnosis present

## 2015-09-29 DIAGNOSIS — I10 Essential (primary) hypertension: Secondary | ICD-10-CM | POA: Diagnosis present

## 2015-09-29 DIAGNOSIS — Z862 Personal history of diseases of the blood and blood-forming organs and certain disorders involving the immune mechanism: Secondary | ICD-10-CM

## 2015-09-29 DIAGNOSIS — G2581 Restless legs syndrome: Secondary | ICD-10-CM | POA: Diagnosis present

## 2015-09-29 DIAGNOSIS — R000 Tachycardia, unspecified: Secondary | ICD-10-CM | POA: Diagnosis present

## 2015-09-29 DIAGNOSIS — Z6821 Body mass index (BMI) 21.0-21.9, adult: Secondary | ICD-10-CM

## 2015-09-29 HISTORY — DX: Gastrointestinal hemorrhage, unspecified: K92.2

## 2015-09-29 HISTORY — DX: Type 2 diabetes mellitus without complications: E11.9

## 2015-09-29 LAB — BASIC METABOLIC PANEL
Anion gap: 10 (ref 5–15)
BUN: 9 mg/dL (ref 6–20)
CO2: 29 mmol/L (ref 22–32)
Calcium: 9.1 mg/dL (ref 8.9–10.3)
Chloride: 97 mmol/L — ABNORMAL LOW (ref 101–111)
Creatinine, Ser: 1.01 mg/dL — ABNORMAL HIGH (ref 0.44–1.00)
GFR calc Af Amer: 53 mL/min — ABNORMAL LOW (ref >60)
GFR, EST NON AFRICAN AMERICAN: 46 mL/min — AB (ref >60)
GLUCOSE: 196 mg/dL — AB (ref 65–99)
POTASSIUM: 4.6 mmol/L (ref 3.5–5.1)
Sodium: 136 mmol/L (ref 135–145)

## 2015-09-29 LAB — CBC WITH DIFFERENTIAL/PLATELET
Basophils Absolute: 0 10*3/uL (ref 0.0–0.1)
Basophils Relative: 0 %
EOS PCT: 2 %
Eosinophils Absolute: 0.2 10*3/uL (ref 0.0–0.7)
HCT: 26.2 % — ABNORMAL LOW (ref 36.0–46.0)
Hemoglobin: 8.1 g/dL — ABNORMAL LOW (ref 12.0–15.0)
LYMPHS ABS: 1.6 10*3/uL (ref 0.7–4.0)
LYMPHS PCT: 19 %
MCH: 28.4 pg (ref 26.0–34.0)
MCHC: 30.9 g/dL (ref 30.0–36.0)
MCV: 91.9 fL (ref 78.0–100.0)
MONO ABS: 0.7 10*3/uL (ref 0.1–1.0)
MONOS PCT: 9 %
Neutro Abs: 5.9 10*3/uL (ref 1.7–7.7)
Neutrophils Relative %: 70 %
PLATELETS: 356 10*3/uL (ref 150–400)
RBC: 2.85 MIL/uL — AB (ref 3.87–5.11)
RDW: 14.7 % (ref 11.5–15.5)
WBC: 8.4 10*3/uL (ref 4.0–10.5)

## 2015-09-29 LAB — CBC
HCT: 26.2 % — ABNORMAL LOW (ref 36.0–46.0)
HEMOGLOBIN: 8.4 g/dL — AB (ref 12.0–15.0)
MCH: 29.8 pg (ref 26.0–34.0)
MCHC: 32.1 g/dL (ref 30.0–36.0)
MCV: 92.9 fL (ref 78.0–100.0)
PLATELETS: 314 10*3/uL (ref 150–400)
RBC: 2.82 MIL/uL — AB (ref 3.87–5.11)
RDW: 14.8 % (ref 11.5–15.5)
WBC: 6.2 10*3/uL (ref 4.0–10.5)

## 2015-09-29 LAB — SAMPLE TO BLOOD BANK

## 2015-09-29 LAB — GLUCOSE, CAPILLARY
Glucose-Capillary: 130 mg/dL — ABNORMAL HIGH (ref 65–99)
Glucose-Capillary: 148 mg/dL — ABNORMAL HIGH (ref 65–99)

## 2015-09-29 MED ORDER — PANTOPRAZOLE SODIUM 40 MG PO TBEC: 40.0000 mg | DELAYED_RELEASE_TABLET | Freq: Every day | ORAL | Status: DC

## 2015-09-29 NOTE — Progress Notes (Signed)
[facility] Progress Note     Subjective: Pt doing very well, up eating eggs this am.  No abdominal pain or N/V, no bleeding since Friday per patient.  Ambulating well.  Wants to go home, thankful for our care.  Objective: Vital signs in last 24 hours: Temp:  [98.1 F (36.7 C)-98.7 F (37.1 C)] 98.5 F (36.9 C) (05/10 0524) Pulse Rate:  [92-111] 111 (05/10 0524) Resp:  [16-19] 18 (05/10 0524) BP: (139-153)/(68-74) 142/74 mmHg (05/10 0524) SpO2:  [95 %-100 %] 95 % (05/10 0524) Last BM Date: 09/28/15  Intake/Output from previous day: 05/09 0701 - 05/10 0700 In: 220 [P.O.:220] Out: 13 [Urine:6; Stool:7] Intake/Output this shift:    PE: Gen:  Alert, NAD, pleasant Abd: Soft, NT/ND, +BS, no HSM   Lab Results:   Recent Labs  09/28/15 0636 09/29/15 0537  WBC 7.3 6.2  HGB 8.7* 8.4*  HCT 26.8* 26.2*  PLT 272 314   BMET  Recent Labs  09/28/15 0636  NA 142  K 4.3  CL 103  CO2 31  GLUCOSE 113*  BUN <5*  CREATININE 0.77  CALCIUM 8.9   PT/INR No results for input(s): LABPROT, INR in the last 72 hours. CMP     Component Value Date/Time   NA 142 09/28/2015 0636   K 4.3 09/28/2015 0636   CL 103 09/28/2015 0636   CO2 31 09/28/2015 0636   GLUCOSE 113* 09/28/2015 0636   BUN <5* 09/28/2015 0636   CREATININE 0.77 09/28/2015 0636   CALCIUM 8.9 09/28/2015 0636   GFRNONAA >60 09/28/2015 0636   GFRAA >60 09/28/2015 0636   Lipase  No results found for: LIPASE     Studies/Results: No results found.  Anti-infectives: Anti-infectives    Start     Dose/Rate Route Frequency Ordered Stop   09/22/15 1818  ceFAZolin (ANCEF) 1-5 GM-% IVPB    Comments:  Ludwig LeanLaughlin, Amy   : cabinet override      09/22/15 1818 09/23/15 0629       Assessment/Plan LGIB at the splenic flexure - likely an AVM vs diverticulosis. H/o GI bleed 7 years ago -Dr. Leone PayorGessner and Dr. Randa EvensEdwards following. Diet advanced, no further bleeding.  Can likley be discharged home and follow up  with GI for re-eval and potential virtual colon to r/o tumor.  They  Think it was more likely diverticular bleed. -Ambulate and IS -SCD's and VTE proph held currently due to bleeding    LOS: 7 days    Sharon Conway 09/29/2015, 8:29 AM Pager: [phone]  (7am - 4:30pm M-F; 7am - 11:30am Sa/Su)

## 2015-09-29 NOTE — ED Notes (Signed)
Pt. reports bloody stools this evening , denies abdominal pain  , she was just discharged here ( admitted) this afternoon after 7 days hospitalization for GI hemorrhage .

## 2015-09-29 NOTE — Care Management Note (Signed)
Case Management Note  Patient Details  Name: Franco Nonesdna Shimkus MRN: [id number] Date of Birth: 27-Oct-1918  Subjective/Objective:                   From 09/28/15 "CM spoke with pt and daughter Harriett Sineancy in regard to recommendations from PT AND OT. Pt declined home health services and DME recommended. Daughter stated she will help mom once d/c.  Gae Gallopngela Cole RN,BSN,CM [phone]"   Action/Plan:  Dc to home in care of daughter.   Expected Discharge Date:                  Expected Discharge Plan:  Home/Self Care  In-House Referral:     Discharge planning Services  CM Consult  Post Acute Care Choice:    Choice offered to:     DME Arranged:    DME Agency:     HH Arranged:    HH Agency:     Status of Service:  Completed, signed off  Medicare Important Message Given:  Yes Date Medicare IM Given:    Medicare IM give by:    Date Additional Medicare IM Given:    Additional Medicare Important Message give by:     If discussed at Long Length of Stay Meetings, dates discussed:    Additional Comments:  Lawerance SabalDebbie Gillian Meeuwsen, RN 09/29/2015, 10:48 AM

## 2015-09-29 NOTE — Progress Notes (Addendum)
[facility] PROGRESS NOTE Subjective patient has had no further bleeding. Her diet has been advanced.  Objective: Vital signs in last 24 hours: Temp:  [98.1 F (36.7 C)-98.7 F (37.1 C)] 98.5 F (36.9 C) (05/10 0524) Pulse Rate:  [92-111] 111 (05/10 0524) Resp:  [16-19] 18 (05/10 0524) BP: (139-153)/(68-74) 142/74 mmHg (05/10 0524) SpO2:  [95 %-100 %] 95 % (05/10 0524) Last BM Date: 09/28/15  Intake/Output from previous day: 05/09 0701 - 05/10 0700 In: 220 [P.O.:220] Out: 13 [Urine:6; Stool:7] Intake/Output this shift:    PE: General-- alert elderly female  Abdomen-- soft and completely nontender  Lab Results:  Recent Labs  09/26/15 1558 09/27/15 0559 09/27/15 1822 09/28/15 0636 09/29/15 0537  WBC 8.1 6.7  --  7.3 6.2  HGB 7.5* 7.5* 7.6* 8.7* 8.4*  HCT 22.2* 22.8* 23.3* 26.8* 26.2*  PLT 147* 193  --  272 314   BMET  Recent Labs  09/28/15 0636  NA 142  K 4.3  CL 103  CO2 31  CREATININE 0.77   LFT No results for input(s): PROT, AST, ALT, ALKPHOS, BILITOT, BILIDIR, IBILI in the last 72 hours. PT/INR No results for input(s): LABPROT, INR in the last 72 hours. PANCREAS No results for input(s): LIPASE in the last 72 hours.       Studies/Results: No results found.  Medications: I have reviewed the patient's current medications.  Assessment/Plan: 1. Lower G.I. bleed. The bleeding site was in the vicinity of the splenic flexure/descending colon. Given the amount of bleeding this was probably diverticular bleed. Fortunately appears to have stopped. She has not had a transfusion now in 4 days and her hemoglobin remains about 8 1/2. Agree with advancing diet and would discharge her on Miralax daily adjusting the dose to obtain soft bowel movement. We will sign off but will be happy to see again if there is further bleeding.  Would have the pt to see Dr Evette CristalGanem in office about 1-2 weeks after discharge to check Hg + stool. If still bleeding may  need virtual colon or colon to r/o tumor. Amount of bleeding and clinical presentation most consistent with diverticular bleed and hopefully preventing constipation will prevent further recurence   Kieth Hartis JR,Lanina Larranaga L 09/29/2015, 7:02 AM  This note was created using voice recognition software. Minor errors may Have occurred unintentionally.  Pager: [phone] If no answer or after hours call [phone]

## 2015-09-29 NOTE — Care Management Important Message (Signed)
Important Message  Patient Details  Name: Sharon Conway MRN: [id number] Date of Birth: [birth year]   Medicare Important Message Given:  Yes    [name] 09/29/2015, 1:19 PM

## 2015-09-29 NOTE — Progress Notes (Signed)
  Date: 09/29/2015  Patient name: Sharon Conway  Medical record number: [id number]  Date of birth: Jul 21, 1918   This patient has been seen and the plan of care was discussed with the house staff. Please see Dr. Bonney RousselBoswell's note for complete details. I concur with his findings.  Discharge today.   Inez CatalinaEmily B Ellan Tess, MD 09/29/2015, 4:18 PM

## 2015-09-29 NOTE — Progress Notes (Signed)
Sharon NonesEdna Conway to be D/C'd to home per MD order.  Discussed with the patient and all questions fully answered.  VSS, Skin clean, dry and intact without evidence of skin break down, no evidence of skin tears noted. IV catheter discontinued intact. Site without signs and symptoms of complications. Dressing and pressure applied.  An After Visit Summary was printed and given to the patient. Patient received prescription.  D/c education completed with patient/family including follow up instructions, medication list, d/c activities limitations if indicated, with other d/c instructions as indicated by MD - patient able to verbalize understanding, all questions fully answered.   Patient instructed to return to ED, call 911, or call MD for any changes in condition.   Patient escorted via WC, and D/C home via private auto.  [name] 09/29/2015 3:48 PM

## 2015-09-29 NOTE — Discharge Instructions (Signed)
I have scheduled you an appointment with Dr. Evette [facility]alGanem [facility] for follow up to check your blood counts and assess for any bleeding.   If you experience further bleeding please return the ED for further evaluation.   Please also call your PCP's office and schedule a hospital follow up visit for the next 1-2 weeks.   Gastrointestinal Bleeding Gastrointestinal (GI) bleeding means there is bleeding somewhere along the digestive tract, between the mouth and anus. CAUSES  There are many different problems that can cause GI bleeding. Possible causes include:  Esophagitis. This is inflammation, irritation, or swelling of the esophagus.  Hemorrhoids.These are veins that are full of blood (engorged) in the rectum. They cause pain, inflammation, and may bleed.  Anal fissures.These are areas of painful tearing which may bleed. They are often caused by passing hard stool.  Diverticulosis.These are pouches that form on the colon over time, with age, and may bleed significantly.  Diverticulitis.This is inflammation in areas with diverticulosis. It can cause pain, fever, and bloody stools, although bleeding is rare.  Polyps and cancer. Colon cancer often starts out as precancerous polyps.  Gastritis and ulcers.Bleeding from the upper gastrointestinal tract (near the stomach) may travel through the intestines and produce black, sometimes tarry, often bad smelling stools. In certain cases, if the bleeding is fast enough, the stools may not be black, but red. This condition may be life-threatening. SYMPTOMS   Vomiting bright red blood or material that looks like coffee grounds.  Bloody, black, or tarry stools. DIAGNOSIS  Your caregiver may diagnose your condition by taking your history and performing a physical exam. More tests may be needed, including:  X-rays and other imaging tests.  Esophagogastroduodenoscopy (EGD). This test uses a flexible, lighted tube to look at your  esophagus, stomach, and small intestine.  Colonoscopy. This test uses a flexible, lighted tube to look at your colon. TREATMENT  Treatment depends on the cause of your bleeding.   For bleeding from the esophagus, stomach, small intestine, or colon, the caregiver doing your EGD or colonoscopy may be able to stop the bleeding as part of the procedure.  Inflammation or infection of the colon can be treated with medicines.  Many rectal problems can be treated with creams, suppositories, or warm baths.  Surgery is sometimes needed.  Blood transfusions are sometimes needed if you have lost a lot of blood. If bleeding is slow, you may be allowed to go home. If there is a lot of bleeding, you will need to stay in the hospital for observation. HOME CARE INSTRUCTIONS   Take any medicines exactly as prescribed.  Keep your stools soft by eating foods that are high in fiber. These foods include whole grains, legumes, fruits, and vegetables. Prunes (1 to 3 a day) work well for many people.  Drink enough fluids to keep your urine clear or pale yellow. SEEK IMMEDIATE MEDICAL CARE IF:   Your bleeding increases.  You feel lightheaded, weak, or you faint.  You have severe cramps in your back or abdomen.  You pass large blood clots in your stool.  Your problems are getting worse. MAKE SURE YOU:   Understand these instructions.  Will watch your condition.  Will get help right away if you are not doing well or get worse.   This information is not intended to replace advice given to you by your health care provider. Make sure you discuss any questions you have with your health care provider.   Document Released:  05/05/2000 Document Revised: 04/24/2012 Document Reviewed: 10/26/2014 Elsevier Interactive Patient Education [employer]

## 2015-09-29 NOTE — Progress Notes (Signed)
   Subjective: No complaints this morning. No further bleeding.   Objective: Vital signs in last 24 hours: Filed Vitals:   09/28/15 0500 09/28/15 1500 09/28/15 2128 09/29/15 0524  BP: 127/48 139/68 153/72 142/74  Pulse: 78 94 92 111  Temp: 98.2 F (36.8 C) 98.1 F (36.7 C) 98.7 F (37.1 C) 98.5 F (36.9 C)  TempSrc: Oral Oral Oral Oral  Resp: 16 16 19 18   SpO2: 100% 100% 99% 95%   Weight change:   Intake/Output Summary (Last 24 hours) at 09/29/15 0707 Last data filed at 09/29/15 0500  Gross per 24 hour  Intake    220 ml  Output     13 ml  Net    207 ml    General: alert, lying comfortably in bed, NAD, AAOx3 Lungs: normal respiratory effort, no accessory muscle use, normal breath sounds, no crackles, and no wheezes. Heart: normal rate, regular rhythm, no murmur, no gallop, and no rub.  Abdomen: soft, mild diffuse tenderness to palpation, normal bowel sounds, no distention, no guarding, no rebound tenderness Pulses: 2+ DP/PT pulses bilaterally Extremities: no edmea Skin: turgor normal and no rashes.  Psych: normal mood and affect  Medications: I have reviewed the patient's current medications. Scheduled Meds: . insulin aspart  0-9 Units Subcutaneous TID WC & HS  . pantoprazole  40 mg Oral BID  . polyethylene glycol  17 g Oral TID   Continuous Infusions:  PRN Meds:. Assessment/Plan:  Acute GI bleed localized to splenic flexure with anemia: No signs of active bleeding or transfusion for past 4 days. H/H stable at ~8.5. Advancing diet per GI. Expect discharge later today. Titrate Miralax to soft bowel movement. Will need close follow up outpatient.  -advance diet - miralax - appreciate GI and surgery assistance  DM: CBGs 92-111 -CBG qac and qhs -SSI-s  Dispo: Disposition is deferred at this time, awaiting improvement of current medical problems.    The patient does have a current PCP (No primary care provider on file.) and [facility]  [facility] after discharge.  The patient does not have transportation limitations that hinder transportation to clinic appointments.  .Services Needed at time of discharge: Y = Yes, Blank = No PT:   OT:   RN:   Equipment:   Other:     LOS: 7 days   Sharon NoseNathan Kaevon Cotta, MD IMTS PGY-1 [phone] 09/29/2015, 7:07 AM

## 2015-09-30 ENCOUNTER — Encounter (HOSPITAL_COMMUNITY): Payer: Self-pay | Admitting: General Practice

## 2015-09-30 DIAGNOSIS — R000 Tachycardia, unspecified: Secondary | ICD-10-CM | POA: Diagnosis present

## 2015-09-30 DIAGNOSIS — K921 Melena: Secondary | ICD-10-CM | POA: Diagnosis present

## 2015-09-30 DIAGNOSIS — D649 Anemia, unspecified: Secondary | ICD-10-CM | POA: Diagnosis not present

## 2015-09-30 DIAGNOSIS — K5730 Diverticulosis of large intestine without perforation or abscess without bleeding: Secondary | ICD-10-CM | POA: Diagnosis present

## 2015-09-30 DIAGNOSIS — M1990 Unspecified osteoarthritis, unspecified site: Secondary | ICD-10-CM | POA: Diagnosis present

## 2015-09-30 DIAGNOSIS — Z7984 Long term (current) use of oral hypoglycemic drugs: Secondary | ICD-10-CM

## 2015-09-30 DIAGNOSIS — K5791 Diverticulosis of intestine, part unspecified, without perforation or abscess with bleeding: Secondary | ICD-10-CM | POA: Diagnosis not present

## 2015-09-30 DIAGNOSIS — K922 Gastrointestinal hemorrhage, unspecified: Secondary | ICD-10-CM | POA: Diagnosis present

## 2015-09-30 DIAGNOSIS — E119 Type 2 diabetes mellitus without complications: Secondary | ICD-10-CM | POA: Diagnosis present

## 2015-09-30 DIAGNOSIS — E440 Moderate protein-calorie malnutrition: Secondary | ICD-10-CM | POA: Diagnosis present

## 2015-09-30 DIAGNOSIS — R933 Abnormal findings on diagnostic imaging of other parts of digestive tract: Secondary | ICD-10-CM | POA: Diagnosis not present

## 2015-09-30 DIAGNOSIS — D62 Acute posthemorrhagic anemia: Secondary | ICD-10-CM | POA: Diagnosis present

## 2015-09-30 DIAGNOSIS — Z791 Long term (current) use of non-steroidal anti-inflammatories (NSAID): Secondary | ICD-10-CM | POA: Diagnosis not present

## 2015-09-30 DIAGNOSIS — Z862 Personal history of diseases of the blood and blood-forming organs and certain disorders involving the immune mechanism: Secondary | ICD-10-CM | POA: Diagnosis not present

## 2015-09-30 DIAGNOSIS — I10 Essential (primary) hypertension: Secondary | ICD-10-CM | POA: Diagnosis present

## 2015-09-30 DIAGNOSIS — Z6821 Body mass index (BMI) 21.0-21.9, adult: Secondary | ICD-10-CM | POA: Diagnosis not present

## 2015-09-30 DIAGNOSIS — G2581 Restless legs syndrome: Secondary | ICD-10-CM | POA: Diagnosis present

## 2015-09-30 LAB — CBG MONITORING, ED
Glucose-Capillary: 128 mg/dL — ABNORMAL HIGH (ref 65–99)
Glucose-Capillary: 137 mg/dL — ABNORMAL HIGH (ref 65–99)

## 2015-09-30 LAB — PREPARE RBC (CROSSMATCH)

## 2015-09-30 LAB — CBC
HCT: 22.5 % — ABNORMAL LOW (ref 36.0–46.0)
HCT: 29.7 % — ABNORMAL LOW (ref 36.0–46.0)
HEMOGLOBIN: 7 g/dL — AB (ref 12.0–15.0)
Hemoglobin: 9.8 g/dL — ABNORMAL LOW (ref 12.0–15.0)
MCH: 28.5 pg (ref 26.0–34.0)
MCH: 30.3 pg (ref 26.0–34.0)
MCHC: 31.1 g/dL (ref 30.0–36.0)
MCHC: 33 g/dL (ref 30.0–36.0)
MCV: 91.5 fL (ref 78.0–100.0)
MCV: 92 fL (ref 78.0–100.0)
PLATELETS: 324 10*3/uL (ref 150–400)
Platelets: 296 10*3/uL (ref 150–400)
RBC: 2.46 MIL/uL — AB (ref 3.87–5.11)
RBC: 3.23 MIL/uL — ABNORMAL LOW (ref 3.87–5.11)
RDW: 14.4 % (ref 11.5–15.5)
RDW: 14.5 % (ref 11.5–15.5)
WBC: 7.7 10*3/uL (ref 4.0–10.5)
WBC: 6.9 10*3/uL (ref 4.0–10.5)

## 2015-09-30 LAB — BASIC METABOLIC PANEL
ANION GAP: 9 (ref 5–15)
BUN: 8 mg/dL (ref 6–20)
CHLORIDE: 100 mmol/L — AB (ref 101–111)
CO2: 32 mmol/L (ref 22–32)
Calcium: 8.9 mg/dL (ref 8.9–10.3)
Creatinine, Ser: 0.93 mg/dL (ref 0.44–1.00)
GFR calc non Af Amer: 51 mL/min — ABNORMAL LOW (ref >60)
GFR, EST AFRICAN AMERICAN: 59 mL/min — AB (ref >60)
Glucose, Bld: 135 mg/dL — ABNORMAL HIGH (ref 65–99)
POTASSIUM: 4.2 mmol/L (ref 3.5–5.1)
SODIUM: 141 mmol/L (ref 135–145)

## 2015-09-30 LAB — GLUCOSE, CAPILLARY
GLUCOSE-CAPILLARY: 135 mg/dL — AB (ref 65–99)
Glucose-Capillary: 123 mg/dL — ABNORMAL HIGH (ref 65–99)

## 2015-09-30 LAB — HEMOGLOBIN AND HEMATOCRIT, BLOOD
HEMATOCRIT: 27.9 % — AB (ref 36.0–46.0)
Hemoglobin: 9.3 g/dL — ABNORMAL LOW (ref 12.0–15.0)

## 2015-09-30 LAB — POC OCCULT BLOOD, ED: Fecal Occult Bld: POSITIVE — AB

## 2015-09-30 MED ORDER — POLYETHYLENE GLYCOL 3350 17 G PO PACK
17.0000 g | PACK | Freq: Every day | ORAL | Status: DC
  Filled 2015-09-30: qty 1

## 2015-09-30 MED ORDER — ENSURE ENLIVE PO LIQD
237.0000 mL | Freq: Two times a day (BID) | ORAL | Status: DC
  Administered 2015-10-01: 237 mL via ORAL

## 2015-09-30 MED ORDER — SODIUM CHLORIDE 0.9 % IV SOLN: INTRAVENOUS | Status: DC

## 2015-09-30 MED ORDER — INSULIN ASPART 100 UNIT/ML ~~LOC~~ SOLN
0.0000 [IU] | Freq: Every day | SUBCUTANEOUS | Status: DC
  Administered 2015-10-01: 3 [IU] via SUBCUTANEOUS
  Administered 2015-10-02: 2 [IU] via SUBCUTANEOUS

## 2015-09-30 MED ORDER — SODIUM CHLORIDE 0.9 % IV SOLN
INTRAVENOUS | Status: DC
  Administered 2015-09-30: 01:00:00 via INTRAVENOUS

## 2015-09-30 MED ORDER — PEG 3350-KCL-NA BICARB-NACL 420 G PO SOLR
4000.0000 mL | Freq: Once | ORAL | Status: AC
  Administered 2015-09-30: 4000 mL via ORAL
  Filled 2015-09-30: qty 4000

## 2015-09-30 MED ORDER — SODIUM CHLORIDE 0.9 % IV SOLN
Freq: Once | INTRAVENOUS | Status: AC
  Administered 2015-09-30: 07:00:00 via INTRAVENOUS

## 2015-09-30 MED ORDER — SODIUM CHLORIDE 0.9 % IV BOLUS (SEPSIS)
500.0000 mL | Freq: Once | INTRAVENOUS | Status: AC
  Administered 2015-09-30: 500 mL via INTRAVENOUS

## 2015-09-30 MED ORDER — SODIUM CHLORIDE 0.9% FLUSH
3.0000 mL | Freq: Two times a day (BID) | INTRAVENOUS | Status: DC
  Administered 2015-09-30 – 2015-10-03 (×6): 3 mL via INTRAVENOUS

## 2015-09-30 MED ORDER — INSULIN ASPART 100 UNIT/ML ~~LOC~~ SOLN
0.0000 [IU] | Freq: Three times a day (TID) | SUBCUTANEOUS | Status: DC
  Administered 2015-09-30: 1 [IU] via SUBCUTANEOUS
  Administered 2015-10-01 (×2): 3 [IU] via SUBCUTANEOUS
  Administered 2015-10-02: 2 [IU] via SUBCUTANEOUS
  Administered 2015-10-02: 1 [IU] via SUBCUTANEOUS
  Administered 2015-10-02: 5 [IU] via SUBCUTANEOUS
  Administered 2015-10-03: 1 [IU] via SUBCUTANEOUS

## 2015-09-30 MED ORDER — BOOST / RESOURCE BREEZE PO LIQD
1.0000 | Freq: Three times a day (TID) | ORAL | Status: DC
  Administered 2015-09-30 – 2015-10-01 (×2): 1 via ORAL

## 2015-09-30 NOTE — ED Notes (Signed)
Pt cbg 137

## 2015-09-30 NOTE — ED Notes (Signed)
Dr Sharlene Mottsameriez to bedside

## 2015-09-30 NOTE — Progress Notes (Signed)
[facility] PROGRESS NOTE Subjective patient was just discharge yesterday after being in the hospital for G.I. bleeding that was felt to be diverticular. She had angiogram showing bleeding site at the splenic flexure. This was symbolized but did not clearly stop bleeding. She was observed in the hospital and consideration was given for colonoscopy versus surgery but she stopped bleeding and given her advanced age nothing was done. She 1 home and had one more episode of bright blood. Has had no further bleeding. No abdominal pain has had stable vital signs.  Objective: Vital signs in last 24 hours: Temp:  [98.3 F (36.8 C)-98.9 F (37.2 C)] 98.5 F (36.9 C) (05/11 0722) Pulse Rate:  [89-112] 89 (05/11 1015) Resp:  [16-25] 21 (05/11 1015) BP: (110-147)/(48-88) 144/77 mmHg (05/11 1015) SpO2:  [92 %-100 %] 96 % (05/11 1015) Weight:  [52.617 kg (116 lb)] 52.617 kg (116 lb) (05/10 1954)    Intake/Output from previous day:   Intake/Output this shift: Total I/O In: 807 [I.V.:500; Blood:307] Out: -   PE: General-- elderly female no acute distress Heart-- regular rate and rhythm murmurs or gallops Lungs-- clear Abdomen-- soft and nontender  Lab Results:  Recent Labs  09/28/15 0636 09/29/15 0537 09/29/15 2024 09/30/15 0435 09/30/15 1026  WBC 7.3 6.2 8.4 7.7  --   HGB 8.7* 8.4* 8.1* 7.0* 9.3*  HCT 26.8* 26.2* 26.2* 22.5* 27.9*  PLT 272 314 356 296  --    BMET  Recent Labs  09/28/15 0636 09/29/15 2024 09/30/15 0435  NA 142 136 141  K 4.3 4.6 4.2  CL 103 97* 100*  CO2 31 29 32  CREATININE 0.77 1.01* 0.93   LFT No results for input(s): PROT, AST, ALT, ALKPHOS, BILITOT, BILIDIR, IBILI in the last 72 hours. PT/INR No results for input(s): LABPROT, INR in the last 72 hours. PANCREAS No results for input(s): LIPASE in the last 72 hours.       Studies/Results: No results found.  Medications: I have reviewed the patient's current  medications.  Assessment/Plan: 1. LGI Bleed. This was documented to be at the splenic flexure stopped. She now has had another single episode. Suspect this is diverticular. AVMs rarely leave this rapidly. I think were going to be forced to evaluate this area just to be sure there is not anything else going on. Have discussed this with the patient and with her daughter go ahead and start her on NuLYTELY and plan colonoscopy and early a.m. tomorrow. This may not be a full colonoscopy if the bleeding cytosine at the splenic flexure area.   Jennipher Weatherholtz JR,Maryclaire Stoecker L 09/30/2015, 11:35 AM  This note was created using voice recognition software. Minor errors may Have occurred unintentionally.  Pager: [phone] If no answer or after hours call [phone]   

## 2015-09-30 NOTE — Progress Notes (Signed)
   Subjective: [name] reports no further bleeding since coming to the hospital. No complaints. Denies any chest pain, shortness of breath, lightheadedness/dizziness, abdominal pain. No bowel movements since coming to ED last night.   Objective: Vital signs in last 24 hours: Filed Vitals:   09/30/15 0845 09/30/15 0915 09/30/15 0945 09/30/15 1015  BP: 147/73 124/65 140/74 144/77  Pulse: 112 94 97 89  Temp:      TempSrc:      Resp: 20 24 25 21   Height:      Weight:      SpO2: 92% 100% 96% 96%   Weight change:   Intake/Output Summary (Last 24 hours) at 09/30/15 1135 Last data filed at 09/30/15 0723  Gross per 24 hour  Intake    807 ml  Output      0 ml  Net    807 ml    General: alert, lying comfortably in bed, NAD, AAOx3 Lungs: normal respiratory effort, no accessory muscle use, normal breath sounds, no crackles Heart: tachycardic, regular rhythm, no murmur, no gallop, and no rub.  Abdomen: soft, mild diffuse tenderness to palpation, normal bowel sounds, no distention, no guarding, no rebound tenderness Pulses: 2+ DP/PT pulses bilaterally Extremities: no edmea Skin: turgor normal and no rashes.  Psych: normal mood and affect  Medications: I have reviewed the patient's current medications. Scheduled Meds: . insulin aspart  0-5 Units Subcutaneous QHS  . insulin aspart  0-9 Units Subcutaneous TID WC  . polyethylene glycol-electrolytes  4,000 mL Oral Once  . sodium chloride flush  3 mL Intravenous Q12H   Continuous Infusions:  PRN Meds:. Assessment/Plan:  Acute GI bleed localized to splenic flexure: Midly tachycardic 90-100s. Received 500 cc NS bolus. Hgb dropped to 7.0 today from 8.1 overnight. Receiving 1 unit PRBC. Consulted GI and Surgery. Starting clear liquid diet and miralax for bowel prep. Will need colonoscopy. She is reluctant about potential surgery if colonoscopy unable to stop the bleeding. Will need further discussion of options if unable to successfully stop  the bleed.  -clear liquid diet -cbc post-transfusion, q6hr -transfuse hgb <7.0 -miralax  -continue telemetry -Appreciate GI and Surgery help  DM: -CBG qac and qhs -SSI-s  Htn: BP stable. 120-140s systolic. Monitor.  -holding home amlodipine with active bleeding  DVT ppx: SCDs  CODE: FULL  Dispo: Disposition is deferred at this time, awaiting improvement of current medical problems.    The patient does have a current PCP (Pcp Not In System) and [facility] discharge.  The patient does not have transportation limitations that hinder transportation to clinic appointments.  .Services Needed at time of discharge: Y = Yes, Blank = No PT:   OT:   RN:   Equipment:   Other:     LOS: 0 days   Valentino NoseNathan Latresa Gasser, MD IMTS PGY-1 [phone] 09/30/2015, 11:35 AM

## 2015-09-30 NOTE — ED Provider Notes (Signed)
CSN: [id number]     Arrival date & time 09/29/15  1945 History   First MD Initiated Contact with Patient 09/29/15 2328     Chief Complaint  Patient presents with  . Hematochezia     (Consider location/radiation/quality/duration/timing/severity/associated sxs/prior Treatment) HPI   Patient with a PMH of lower GI bleeding and hypertension presents to the ER with rectal bleeding. She was discharged from inpatient ER stay from May 3 an discharged today May 10. She just went home today and reports being home for 1 hour and was only in her night gown when she had the sensation of needing to have a bowel movement, almost made it to the bathroom and had a large bloody, unsure if there was stool, bright red passage of blood onto the floor per pt and family member. She did not have associated abdominal pain or loc. She comes back to the ER now with her daughter for further treatment. While in the hospital the bleeding had stopped on its own and a colonoscopy was not done. She did require blood transfusions and her hemoglobin was around 8.5 at discharge.  Pt is stable at initial presentation and in no acute distress.  Past Medical History  Diagnosis Date  . Hypertension    Past Surgical History  Procedure Laterality Date  . Abdominal hysterectomy     No family history on file. Social History  Substance Use Topics  . Smoking status: Never Smoker   . Smokeless tobacco: None  . Alcohol Use: No   OB History    No data available     Review of Systems  Review of Systems All other systems negative except as documented in the HPI. All pertinent positives and negatives as reviewed in the HPI.   Allergies  Review of patient's allergies indicates no known allergies.  Home Medications   Prior to Admission medications   Medication Sig Start Date End Date Taking? Authorizing Provider  amLODipine (NORVASC) 5 MG tablet Take 5 mg by mouth daily.   Yes Historical Provider, MD  meloxicam (MOBIC) 15  MG tablet Take 15 mg by mouth daily.   Yes Historical Provider, MD  metFORMIN (GLUCOPHAGE) 500 MG tablet Take 500 mg by mouth 2 (two) times daily with a meal.   Yes Historical Provider, MD  rOPINIRole (REQUIP) 2 MG tablet Take 2 mg by mouth at bedtime.   Yes Historical Provider, MD  solifenacin (VESICARE) 5 MG tablet Take 5 mg by mouth daily.   Yes Historical Provider, MD  pantoprazole (PROTONIX) 40 MG tablet Take 1 tablet (40 mg total) by mouth daily. Patient not taking: Reported on 09/29/2015 09/29/15   Valentino Nose, MD   BP 137/63 mmHg  Pulse 102  Temp(Src) 98.9 F (37.2 C) (Oral)  Resp 18  Ht  (1.575 m)  Wt 52.617 kg  BMI 21.21 kg/m2  SpO2 100% Physical Exam  Constitutional: She appears well-developed and well-nourished. No distress.  HENT:  Head: Normocephalic and atraumatic.  Neck: Neck supple.  Cardiovascular: Normal rate and regular rhythm.  Pulmonary/Chest: Effort normal and breath sounds normal. No respiratory distress. She has no wheezes. She has no rales.  Abdominal: Soft. She exhibits no distension and no mass. There is no tenderness. There is no rebound and no guarding.  Genitourinary: Rectal exam shows no external hemorrhoid, no mass, no tenderness and anal tone normal.  Adult diaper with small amount of blood tinge. On rectal exam she does have BRB per rectum. Not actively passing blood.  No stool noted to glove. Neurological: She is alert.  Skin: She is not diaphoretic.  Nursing note and vitals reviewed.  ED Course  Procedures (including critical care time) Labs Review Labs Reviewed  CBC WITH DIFFERENTIAL/PLATELET - Abnormal; Notable for the following:    RBC 2.85 (*)    Hemoglobin 8.1 (*)    HCT 26.2 (*)    All other components within normal limits  BASIC METABOLIC PANEL - Abnormal; Notable for the following:    Chloride 97 (*)    Glucose, Bld 196 (*)    Creatinine, Ser 1.01 (*)    GFR calc non Af Amer 46 (*)    GFR calc Af Amer 53 (*)    All  other components within normal limits  POC OCCULT BLOOD, ED - Abnormal; Notable for the following:    Fecal Occult Bld POSITIVE (*)    All other components within normal limits  POC OCCULT BLOOD, ED  SAMPLE TO BLOOD BANK    Imaging Review No results found. I have personally reviewed and evaluated these images and lab results as part of my medical decision-making.   EKG Interpretation None      MDM   Final diagnoses:  Gastrointestinal hemorrhage, unspecified gastritis, unspecified gastrointestinal hemorrhage type   Discussed case with Dr. Clarene DukeLittle, Burnell Blanksachael who has seen patient as well. Patient is currently stable and not actively bleeding - hemoglobin is stable  I spoke with Internal Medicine Residents who have agreed for admission and will come see patient Temp admit orders for inpatient, Tele, [facility] Residents, [facility].  Filed Vitals:   09/29/15 2158 09/29/15 2345  BP: 141/88 137/63  Pulse: 109 102  Temp:    Resp: [id number]18       Otho Michalik, PA-C 09/30/15 0029  Laurence Spatesachel Morgan Little, MD 10/04/15 [phone]

## 2015-09-30 NOTE — H&P (Signed)
Date: 09/30/2015               Patient Name:  Sharon Conway MRN: [id number]  DOB: 14-Jan-1919 Age / Sex: 82 y.o., female   PCP: Pcp Not In System         Medical Service: Internal Medicine Teaching Service         Attending Physician: Dr. Inez Catalina, MD    First Contact: Dr. Karma Greaser Pager: [phone]  Second Contact: Dr. Criselda Peaches Pager: [phone]       After Hours (After 5p/  First Contact Pager: [phone]  weekends / holidays): Second Contact Pager: [phone]   Chief Complaint: Hematochezia  History of Present Illness: 82 y/o woman with PMHx significant for T2DM, arthritis, HTN, restless leg syndrome and recent anemia from lower GI bleed with hospitalization on 5/3 and discharged in the afternoon on 5/10 went home for a few hours before she tried to pass a bowel movement and passed large bright red blood. There was no associated abdominal or rectal pain. She denies any moments of lightheadedness, chest pain, or shortness of breath during or after this. On presentation to the ED bright red blood was noted in the rectal vault with no stool but has not passed any other blood. Hgb was checked at 8.1 slightly decreased from 8.4 earlier today.  During her previous few days of hospitalization she received a total of 6 units pRBCs. A splenic flexure bleed was identified but unable to be controlled via IR embolization. She was evaluated by the general surgery and gastroenterology services but her bleeding had stopped after several days of supportive care alone.  Meds: Current Facility-Administered Medications  Medication Dose Route Frequency Provider Last Rate Last Dose  . sodium chloride 0.9 % bolus 500 mL  500 mL Intravenous Once Fuller Plan, MD       Current Outpatient Prescriptions  Medication Sig Dispense Refill  . amLODipine (NORVASC) 5 MG tablet Take 5 mg by mouth daily.    . meloxicam (MOBIC) 15 MG tablet Take 15 mg by mouth daily.    . metFORMIN (GLUCOPHAGE) 500 MG tablet Take 500 mg  by mouth 2 (two) times daily with a meal.    . rOPINIRole (REQUIP) 2 MG tablet Take 2 mg by mouth at bedtime.    . solifenacin (VESICARE) 5 MG tablet Take 5 mg by mouth daily.    . pantoprazole (PROTONIX) 40 MG tablet Take 1 tablet (40 mg total) by mouth daily. (Patient not taking: Reported on 09/29/2015) 30 tablet 0    Allergies: Allergies as of 09/29/2015  . (No Known Allergies)   Past Medical History  Diagnosis Date  . Hypertension    Past Surgical History  Procedure Laterality Date  . Abdominal hysterectomy     No family history on file. Social History   Social History  . Marital Status: Single    Spouse Name: N/A  . Number of Children: N/A  . Years of Education: N/A   Occupational History  . Not on file.   Social History Main Topics  . Smoking status: Never Smoker   . Smokeless tobacco: Not on file  . Alcohol Use: No  . Drug Use: No  . Sexual Activity: Not on file   Other Topics Concern  . Not on file   Social History Narrative    Review of Systems: Review of Systems  Constitutional: Positive for malaise/fatigue.  Eyes: Negative for blurred vision.  Respiratory: Negative for shortness of breath.  Cardiovascular: Negative for chest pain.  Gastrointestinal: Positive for blood in stool. Negative for abdominal pain.  Genitourinary: Negative for dysuria.  Musculoskeletal: Negative for falls.  Skin: Negative for rash.  Neurological: Negative for dizziness and headaches.  Endo/Heme/Allergies: Negative for polydipsia.  Psychiatric/Behavioral: The patient is not nervous/anxious.     Physical Exam: Blood pressure 137/63, pulse 102, temperature 98.9 F (37.2 C), temperature source Oral, resp. rate 18, height 5\' 2"  (1.575 m), weight 52.617 kg (116 lb), SpO2 100 %.   GENERAL- alert, co-operative, NAD HEENT- Atraumatic, PERRL, oral mucosa appears moist CARDIAC- Tachycardic with a regular rhythm, no murmurs, rubs or gallops. RESP- Mild crackles appreciated in  bilateral bases and anteriorly ABDOMEN- Soft, nontender, no guarding or rebound, normoactive bowel sounds present NEURO- Strength upper and lower extremities- 5/5, Sensation intact globally EXTREMITIES- pulse 2+, symmetric, no pedal edema. SKIN- Warm, dry, No rash or lesion. PSYCH- Normal mood and affect, appropriate thought content and speech.   Lab results: Basic Metabolic Panel:  Recent Labs  16/02/9604/09/17 0636 09/29/15 2024  NA 142 136  K 4.3 4.6  CL 103 97*  CO2 31 29  GLUCOSE 113* 196*  BUN <5* 9  CREATININE 0.77 1.01*  CALCIUM 8.9 9.1   CBC:  Recent Labs  09/28/15 0636 09/29/15 0537 09/29/15 2024  WBC 7.3 6.2 8.4  NEUTROABS 4.9  --  5.9  HGB 8.7* 8.4* 8.1*  HCT 26.8* 26.2* 26.2*  MCV 91.8 92.9 91.9  PLT 272 314 356  CBG:  Recent Labs  09/28/15 0902 09/28/15 1149 09/28/15 1659 09/28/15 2135 09/29/15 0806 09/29/15 1214  GLUCAP 168* 139* 96 100* 130* 148*   Assessment & Plan by Problem: GI bleed: Lower Gi bleed at the splenic flexure likely related to AVM or severe diverticular bleed. She has no known coagulopathy. Hemodynamically stable with only mild anemia on initial labs. He bleeding previously stopped with several days of supportive care but now with recurrence will again discuss the case with specialist services. Low tranfusion threshold. IVF resuscitation as needed. Surgical intervention would be a consideration if she is having substantial blood losses. -Tranfuse if active bleeding overnight -Repeat CBC in AM -Plan to re-consult surgery, GI to consider options of colonoscopy, partial colectomy -500mL NS bolus for tachycardia -Admit to telemetry  Hypertension: Stable. Maintaining pressures but tachycardic suggesting compensation. -Hold home amlodipine -NS as above  Diabetes Mellitus: Controlled on metformin at home. Hold for now with hospitalization and also slight SCr increase. -CBG ACHS -SSI-S  Arthritis: Stable. Hold home Meloxicam in setting  GI bleed  Restless leg syndrome: Stable. Hold home ropinirole.  FULL CODE Diet: Liquids VTE ppx: SCDs  Dispo: Disposition is deferred at this time, awaiting improvement of current medical problems.   Signed: Fuller Planhristopher W Andreas Sobolewski, MD 09/30/2015, 1:18 AM

## 2015-09-30 NOTE — Consult Note (Signed)
[facility] Progress Note     Subjective: 82 y/o AA female who was admitted between 09/22/15 - 09/29/15 for acute GI bleed.  It was thought that her bleeding was due to diverticulitis or an AVM at the splenic flexure. Had IR angio on 09/22/15 which showed active extrav at a small arcade vessel of the left common arteryat teh splenic flexure.  Angio embolization was unsuccessful.  She was then prepped by GI for colonoscopy, but her bleeding stopped so the CSP was never done.  Dr. Ramon DredgeEdward and Dr. Evette CristalGanem were following.    She states yesterday after arriving home she had the urge to have a BM and she had another bright red bloody BM yesterday.  She was brought to the hospital by her daughter for further treatment.  She says she has not had any further blood per rectum.  She has not pain or N/V.  Denies SOB/CP.  She does not want surgery at her age.  She is willing to have a colonoscopy or IR.    Objective: Vital signs in last 24 hours: Temp:  [98.3 F (36.8 C)-98.9 F (37.2 C)] 98.5 F (36.9 C) (05/11 0722) Pulse Rate:  [91-112] 96 (05/11 0722) Resp:  [16-25] 20 (05/11 0722) BP: (110-141)/(48-88) 126/66 mmHg (05/11 0722) SpO2:  [94 %-100 %] 97 % (05/11 0722) Weight:  [116 lb (52.617 kg)] 116 lb (52.617 kg) (05/10 1954)    Intake/Output from previous day:   Intake/Output this shift: Total I/O In: 807 [I.V.:500; Blood:307] Out: -   PE: General: pleasant, WD/WN AA female who is laying in bed in NAD HEENT: head is normocephalic, atraumatic.  Sclera are noninjected.  PERRL.  Ears and nose without any masses or lesions.  Mouth is pink and but dry, dentures in place. Heart: regular, rate, and rhythm.  Normal s1,s2. No obvious murmurs, gallops, or rubs noted.  Palpable radial and pedal pulses bilaterally Lungs: CTAB, no wheezes, rhonchi, or rales noted.  Respiratory effort nonlabored, good effort. Abd: soft, NT/ND, +BS, no masses, hernias, or organomegaly MS: all 4 extremities are  symmetrical with no cyanosis, clubbing, or edema. Skin: warm and dry with no masses, lesions, or rashes Psych: A&Ox3 with an appropriate affect.  Lab Results:   Recent Labs  09/29/15 2024 09/30/15 0435  WBC 8.4 7.7  HGB 8.1* 7.0*  HCT 26.2* 22.5*  PLT 356 296   BMET  Recent Labs  09/29/15 2024 09/30/15 0435  NA 136 141  K 4.6 4.2  CL 97* 100*  CO2 29 32  GLUCOSE 196* 135*  BUN 9 8  CREATININE 1.01* 0.93  CALCIUM 9.1 8.9   PT/INR No results for input(s): LABPROT, INR in the last 72 hours. CMP     Component Value Date/Time   NA 141 09/30/2015 0435   K 4.2 09/30/2015 0435   CL 100* 09/30/2015 0435   CO2 32 09/30/2015 0435   GLUCOSE 135* 09/30/2015 0435   BUN 8 09/30/2015 0435   CREATININE 0.93 09/30/2015 0435   CALCIUM 8.9 09/30/2015 0435   GFRNONAA 51* 09/30/2015 0435   GFRAA 59* 09/30/2015 0435   Lipase  No results found for: LIPASE     Studies/Results: No results found.  Anti-infectives: Anti-infectives    None       Assessment/Plan LGIB at the splenic flexure - likely an AVM vs diverticulosis at splenic flexure. H/o GI bleed 7 years ago -[facility] following.  -Will need either CSP for diagnostic/therapeutic reasons and/or repeat angio with  embolization.  The patient does not want surgery.  We would not recommend surgery except as a last resort and the patient was unstable.  She would not likely to agree to it anyway.  May need palliative care discussion to discuss goals of care.  We discussed the risks of surgery and she seems to fully understand this.   -Await GI's/IR's evaluations/recommendations. -SCD's and VTE proph held currently due to bleeding    LOS: 0 days    Nonie Hoyer 09/30/2015, 8:34 AM Pager: [phone]  (7am - 4:30pm M-F; 7am - 11:30am Sa/Su)

## 2015-10-01 ENCOUNTER — Observation Stay (HOSPITAL_COMMUNITY): Payer: Medicare Other | Admitting: Certified Registered"

## 2015-10-01 ENCOUNTER — Encounter (HOSPITAL_COMMUNITY)
Admission: EM | Disposition: A | Discharge status: Home / Self Care | Payer: Self-pay | Source: Home / Self Care | Attending: Internal Medicine

## 2015-10-01 ENCOUNTER — Encounter (HOSPITAL_COMMUNITY): Payer: Self-pay

## 2015-10-01 DIAGNOSIS — K922 Gastrointestinal hemorrhage, unspecified: Secondary | ICD-10-CM | POA: Diagnosis not present

## 2015-10-01 DIAGNOSIS — D62 Acute posthemorrhagic anemia: Secondary | ICD-10-CM | POA: Diagnosis not present

## 2015-10-01 DIAGNOSIS — I10 Essential (primary) hypertension: Secondary | ICD-10-CM | POA: Diagnosis not present

## 2015-10-01 DIAGNOSIS — M1990 Unspecified osteoarthritis, unspecified site: Secondary | ICD-10-CM | POA: Diagnosis not present

## 2015-10-01 DIAGNOSIS — E440 Moderate protein-calorie malnutrition: Secondary | ICD-10-CM | POA: Diagnosis not present

## 2015-10-01 DIAGNOSIS — Z7984 Long term (current) use of oral hypoglycemic drugs: Secondary | ICD-10-CM | POA: Diagnosis not present

## 2015-10-01 DIAGNOSIS — K5730 Diverticulosis of large intestine without perforation or abscess without bleeding: Secondary | ICD-10-CM | POA: Diagnosis not present

## 2015-10-01 DIAGNOSIS — E119 Type 2 diabetes mellitus without complications: Secondary | ICD-10-CM | POA: Diagnosis not present

## 2015-10-01 DIAGNOSIS — K921 Melena: Secondary | ICD-10-CM | POA: Diagnosis not present

## 2015-10-01 HISTORY — PX: COLONOSCOPY: SHX5424

## 2015-10-01 LAB — CBC
HCT: 26.3 % — ABNORMAL LOW (ref 36.0–46.0)
HCT: 27.4 % — ABNORMAL LOW (ref 36.0–46.0)
HEMOGLOBIN: 8.6 g/dL — AB (ref 12.0–15.0)
HEMOGLOBIN: 8.5 g/dL — AB (ref 12.0–15.0)
MCH: 30.1 pg (ref 26.0–34.0)
MCH: 28.4 pg (ref 26.0–34.0)
MCHC: 32.7 g/dL (ref 30.0–36.0)
MCHC: 31 g/dL (ref 30.0–36.0)
MCV: 92 fL (ref 78.0–100.0)
MCV: 91.6 fL (ref 78.0–100.0)
PLATELETS: 314 10*3/uL (ref 150–400)
Platelets: 301 10*3/uL (ref 150–400)
RBC: 2.86 MIL/uL — ABNORMAL LOW (ref 3.87–5.11)
RBC: 2.99 MIL/uL — AB (ref 3.87–5.11)
RDW: 14.4 % (ref 11.5–15.5)
RDW: 14.4 % (ref 11.5–15.5)
WBC: 5.7 10*3/uL (ref 4.0–10.5)
WBC: 7.5 10*3/uL (ref 4.0–10.5)

## 2015-10-01 LAB — GLUCOSE, CAPILLARY
GLUCOSE-CAPILLARY: 114 mg/dL — AB (ref 65–99)
GLUCOSE-CAPILLARY: 215 mg/dL — AB (ref 65–99)
GLUCOSE-CAPILLARY: 228 mg/dL — AB (ref 65–99)
GLUCOSE-CAPILLARY: 255 mg/dL — AB (ref 65–99)

## 2015-10-01 LAB — TYPE AND SCREEN
ABO/RH(D): O POS
Antibody Screen: NEGATIVE
Unit division: 0

## 2015-10-01 SURGERY — COLONOSCOPY
Anesthesia: Monitor Anesthesia Care

## 2015-10-01 MED ORDER — LACTATED RINGERS IV SOLN
INTRAVENOUS | Status: DC | PRN
  Administered 2015-10-01: 08:00:00 via INTRAVENOUS

## 2015-10-01 MED ORDER — POLYETHYLENE GLYCOL 3350 17 G PO PACK
17.0000 g | PACK | Freq: Three times a day (TID) | ORAL | Status: DC
  Administered 2015-10-01: 17 g via ORAL
  Filled 2015-10-01: qty 1

## 2015-10-01 MED ORDER — LACTATED RINGERS IV SOLN
INTRAVENOUS | Status: DC
  Administered 2015-10-01: 1000 mL via INTRAVENOUS

## 2015-10-01 MED ORDER — PROPOFOL 500 MG/50ML IV EMUL
INTRAVENOUS | Status: DC | PRN
  Administered 2015-10-01: 50 ug/kg/min via INTRAVENOUS

## 2015-10-01 MED ORDER — ENSURE ENLIVE PO LIQD
237.0000 mL | Freq: Two times a day (BID) | ORAL | Status: DC
  Administered 2015-10-01 – 2015-10-03 (×4): 237 mL via ORAL

## 2015-10-01 MED ORDER — ADULT MULTIVITAMIN W/MINERALS CH
1.0000 | ORAL_TABLET | Freq: Every day | ORAL | Status: DC
  Administered 2015-10-02 – 2015-10-03 (×2): 1 via ORAL
  Filled 2015-10-01 (×2): qty 1

## 2015-10-01 MED ORDER — PROPOFOL 10 MG/ML IV BOLUS
INTRAVENOUS | Status: DC | PRN
  Administered 2015-10-01 (×3): 10 mg via INTRAVENOUS

## 2015-10-01 NOTE — Progress Notes (Signed)
Initial Nutrition Assessment  DOCUMENTATION CODES:   Non-severe (moderate) malnutrition in context of chronic illness  INTERVENTION:  Continue Ensure Enlive po BID, each supplement provides 350 kcal and 20 grams of protein Provide Multivitamin with minerals daily  NUTRITION DIAGNOSIS:   Malnutrition related to chronic illness as evidenced by moderate depletion of body fat, severe depletion of muscle mass.   GOAL:   Patient will meet greater than or equal to 90% of their needs   MONITOR:   PO intake, Labs, Weight trends, Skin, I & O's  REASON FOR ASSESSMENT:   Malnutrition Screening Tool    ASSESSMENT:   82yo woman with PMH of HTN, DM2, RLS admitted for LGI Bleed.   Pt reports that for the past 2 weeks she has been drinking mostly juice due to having GI issues. She reports having a good appetite and eating normally now. She states that she used to weigh 125 to 135 lbs, but after being diagnosed with diabetes she slowly started to lose weight; she is unsure how much she has lost in the past 2 weeks. Pt has severe muscle wasting and moderate fat wasting per nutrition focused physical exam. She reports drinking Ensure once daily at home. RD encouraged increasing Ensure to twice daily until weight stabilizes; use Glucerna if blood glucose runs high.   Labs: glucose ranging 114 to 228 mg/dL, low hemoglobin, low HCT  Diet Order:  Diet Heart Room service appropriate?: Yes; Fluid consistency:: Thin  Skin:  Reviewed, no issues  Last BM:  5/12  Height:   Ht Readings from Last 1 Encounters:  10/01/15 5\' 2"  (1.575 m)    Weight:   Wt Readings from Last 1 Encounters:  10/01/15 119 lb (53.978 kg)    Ideal Body Weight:  50 kg  BMI:  Body mass index is 21.76 kg/(m^2).  Estimated Nutritional Needs:   Kcal:  1300-1500  Protein:  60-70 grams  Fluid:  1.3-1.5 L/day  EDUCATION NEEDS:   No education needs identified at this time  Sharon Conway RD, LDN Inpatient  Clinical Dietitian Pager: [phone] After Hours Pager: [phone]

## 2015-10-01 NOTE — Progress Notes (Signed)
Pt with large watery/bloody stools. Pt states she voided at same time as stool. Blood noted in stool. Pt BP 142/74, HR in 90s. Pt with no complaints of dizziness when ambulating. Pt asymptomatic. Hgb at 19:20 is 9.8. Spoke with IMTS MD on call. Will continue to monitor. Huel Coventryosenberger, Brax Walen A, RN

## 2015-10-01 NOTE — Progress Notes (Signed)
[facility] and requested alarm be turned off overnight. Pt states she will call for help if [facility]

## 2015-10-01 NOTE — Transfer of Care (Signed)
Immediate Anesthesia Transfer of Care Note  Patient: Sharon Conway  Procedure(s) Performed: Procedure(s) with comments: COLONOSCOPY (N/A) - contol of bleeding  Patient Location: PACU  Anesthesia Type:MAC  Level of Consciousness: awake, alert , oriented and sedated  Airway & Oxygen Therapy: Patient Spontanous Breathing  Post-op Assessment: Report given to RN, Post -op Vital signs reviewed and stable and Patient moving all extremities X 4  Post vital signs: Reviewed and stable  Last Vitals:  Filed Vitals:   10/01/15 0132 10/01/15 0706  BP: 124/64 132/53  Pulse: 99 104  Temp: 36.9 C 37.1 C  Resp: 17 13    Last Pain: There were no vitals filed for this visit.       Complications: No apparent anesthesia complications

## 2015-10-01 NOTE — Interval H&P Note (Signed)
History and Physical Interval Note:  10/01/2015 7:46 AM  Franco NonesEdna Buchler  has presented today for surgery, with the diagnosis of lgi BLEED  The various methods of treatment have been discussed with the patient and family. After consideration of risks, benefits and other options for treatment, the patient has consented to  Procedure(s) with comments: COLONOSCOPY (N/A) - contol of bleeding as a surgical intervention .  The patient's history has been reviewed, patient examined, no change in status, stable for surgery.  I have reviewed the patient's chart and labs.  Questions were answered to the patient's satisfaction.     Tiaunna Buford JR,Shene Maxfield L

## 2015-10-01 NOTE — Anesthesia Preprocedure Evaluation (Addendum)
Anesthesia Evaluation  Patient identified by MRN, date of birth, ID band Patient awake    Reviewed: Allergy & Precautions, H&P , Patient's Chart, lab work & pertinent test results, reviewed documented beta blocker date and time   Airway Mallampati: I  TM Distance: >3 FB Neck ROM: Full    Dental no notable dental hx. (+) Edentulous Upper, Edentulous Lower   Pulmonary    Pulmonary exam normal breath sounds clear to auscultation       Cardiovascular hypertension,  Rhythm:regular Rate:Normal     Neuro/Psych    GI/Hepatic   Endo/Other  diabetes  Renal/GU      Musculoskeletal   Abdominal   Peds  Hematology  (+) anemia ,   Anesthesia Other Findings   Reproductive/Obstetrics                            Anesthesia Physical Anesthesia Plan  ASA: II  Anesthesia Plan: MAC   Post-op Pain Management:    Induction: Intravenous  Airway Management Planned: Mask and Natural Airway  Additional Equipment:   Intra-op Plan:   Post-operative Plan:   Informed Consent: I have reviewed the patients History and Physical, chart, labs and discussed the procedure including the risks, benefits and alternatives for the proposed anesthesia with the patient or authorized representative who has indicated his/her understanding and acceptance.   Dental Advisory Given  Plan Discussed with: CRNA and Surgeon  Anesthesia Plan Comments: (Discussed sedation and potential to need to place airway or ETT if warranted by clinical changes intra-operatively. We will start procedure as MAC.)        Anesthesia Quick Evaluation

## 2015-10-01 NOTE — Progress Notes (Signed)
   Subjective: Patient reports no further bleeding. Nursing staff reports bloody bowel movement overnight. Went for colonoscopy this morning.    Objective: Vital signs in last 24 hours: Filed Vitals:   10/01/15 0706 10/01/15 0844 10/01/15 0905 10/01/15 1216  BP: 132/53  134/71 134/62  Pulse: 104   97  Temp: 98.7 F (37.1 C) 97.7 F (36.5 C)  98.9 F (37.2 C)  TempSrc: Oral Oral  Oral  Resp: 13   18  Height: 5\' 2"  (1.575 m)     Weight: 119 lb (53.978 kg)     SpO2: 100%   100%   Weight change: 3 lb 6.4 oz (1.542 kg)  Intake/Output Summary (Last 24 hours) at 10/01/15 1613 Last data filed at 10/01/15 1300  Gross per 24 hour  Intake   1200 ml  Output    400 ml  Net    800 ml    General: alert, lying comfortably in bed, NAD, AAOx3 Lungs: normal respiratory effort, no accessory muscle use, normal breath sounds, no crackles Heart: tachycardic, regular rhythm, no murmur, no gallop, and no rub.  Abdomen: soft, mild diffuse tenderness to palpation, normal bowel sounds, no distention, no guarding, no rebound tenderness Pulses: 2+ DP/PT pulses bilaterally Extremities: no edmea Skin: turgor normal and no rashes.  Psych: normal mood and affect  Medications: I have reviewed the patient's current medications. Scheduled Meds: . feeding supplement  1 Container Oral TID BM  . feeding supplement (ENSURE ENLIVE)  237 mL Oral BID BM  . insulin aspart  0-5 Units Subcutaneous QHS  . insulin aspart  0-9 Units Subcutaneous TID WC  . polyethylene glycol  17 g Oral TID  . sodium chloride flush  3 mL Intravenous Q12H   Continuous Infusions:  PRN Meds:. Assessment/Plan:  Acute GI bleed localized to splenic flexure: Underwent colonoscopy this morning. Extensive diverticular disease but no signs of bleeding. Hgb 9.8 > 8.6 this morning. Will trend Hgb tonight. Monitor for re-bleeding. Patient does not wish to have surgery for hemicolectomy. Will need good outpatient follow up and likely will need  transfusions outpatient for re-bleeding.  -advance diet as tolerated -trend Hgb q12hr -transfuse hgb <7.0 -continue telemetry -Appreciate GI and Surgery help  DM: -CBG qac and qhs -SSI-s  Htn: BP stable. Monitor.  -holding home amlodipine  DVT ppx: SCDs  CODE: FULL  Dispo: Disposition is deferred at this time, awaiting improvement of current medical problems.    The patient does have a current PCP (Pcp Not In System) and [facility].  The patient does not have transportation limitations that hinder transportation to clinic appointments.  .Services Needed at time of discharge: Y = Yes, Blank = No PT:   OT:   RN:   Equipment:   Other:     LOS: 1 day   Valentino NoseNathan Ifeanyi Mickelson, MD IMTS PGY-1 [phone] 10/01/2015, 4:13 PM

## 2015-10-01 NOTE — Op Note (Addendum)
[facility] Patient Name: Franco Nonesdna Munger Procedure Date : 10/01/2015 MRN: [id number] Attending MD: Tresea MallJames L Aizley Stenseth , MD Date of Birth: [birth year] CSN: [id number] Age: 82 Admit Type: Inpatient Procedure:                Colonoscopy Indications:              Hematochezia,previous G.I. bleeding scan an                            angiography showed bleeding site at the hepatic                            flexure Providers:                Fayrene FearingJames L. Randa EvensEdwards, MD, Will BonnetKatie Winchester, RN, Clearnce SorrelKatie                            Smith, Technician, Lacie ScottsKaty Baker, CRNA Referring MD:              Medicines:                Propofol total dose 220 mg IV Complications:            No immediate complications. Estimated Blood Loss:     Estimated blood loss: none. Procedure:                Pre-Anesthesia Assessment:                           - Prior to the procedure, a History and Physical                            was performed, and patient medications and                            allergies were reviewed. The patient's tolerance of                            previous anesthesia was also reviewed. The risks                            and benefits of the procedure and the sedation                            options and risks were discussed with the patient.                            All questions were answered, and informed consent                            was obtained. Prior Anticoagulants: The patient has                            taken no previous anticoagulant or antiplatelet  agents. ASA Grade Assessment: II - A patient with                            mild systemic disease. After reviewing the risks                            and benefits, the patient was deemed in                            satisfactory condition to undergo the procedure.                           After obtaining informed consent, the colonoscope                            was passed under direct vision.  Throughout the                            procedure, the patient's blood pressure, pulse, and                            oxygen saturations were monitored continuously. The                            EC-[id number] ([id number]) scope was introduced through                            the anus and advanced to the the cecum, identified                            by appendiceal orifice and ileocecal valve. The                            EC-[id number] ([id number]) scope was introduced through                            the anus and advanced to the. The colonoscopy was                            extremely difficult due to multiple diverticula in                            the colon. Successful completion of the procedure                            was aided by changing endoscopes and applying                            abdominal pressure. due to the severe diverticular                            disease we were unable to pass a sigmoid colon and  switch to the ultrathin colonoscope and with great                            effort in moving the patient we were able to                            advance to the cecum. The patient tolerated the                            procedure well. The quality of the bowel                            preparation was excellent. The ileocecal valve,                            appendiceal orifice, and rectum were photographed. Scope In: 7:57:54 AM Scope Out: 8:34:41 AM Scope Withdrawal Time: 0 hours 10 minutes 51 seconds  Total Procedure Duration: 0 hours 36 minutes 47 seconds  Findings:      The perianal and digital rectal examinations were normal.      Many small and large-mouthed diverticula were found in the entire colon.       There was no evidence of diverticular bleeding.      There is no endoscopic evidence of bleeding in the entire colon.      The retroflexed view of the distal rectum and anal verge was normal and       showed no anal or  rectal abnormalities. Impression:               - Severe diverticulosis in the entire examined                            colon. There was no evidence of diverticular                            bleeding.                           - The distal rectum and anal verge are normal on                            retroflexion view.                           - No specimens collected. Moderate Sedation:      MAC by anesthesia Recommendation:           - Resume previous diet.                           - Continue present medications.                           - No repeat colonoscopy due to age.                           - Return patient to hospital ward for ongoing care. Procedure  Code(s):        --- Professional ---                           [phone], Colonoscopy, flexible; diagnostic, including                            collection of specimen(s) by brushing or washing,                            when performed (separate procedure) Diagnosis Code(s):        --- Professional ---                           K92.1, Melena (includes Hematochezia)                           K57.30, Diverticulosis of large intestine without                            perforation or abscess without bleeding CPT copyright 2016 American [employer] Association. All rights reserved. The codes documented in this report are preliminary and upon coder review may  be revised to meet current compliance requirements. Tresea Mall, MD 10/01/2015 9:11:36 AM This report has been signed electronically. Number of Addenda: 0

## 2015-10-01 NOTE — Progress Notes (Signed)
  Date: 10/01/2015  Patient name: Sharon Conway  Medical record number: [id number]  Date of birth: Dec 16, 1918   This patient has been seen and the plan of care was discussed with the house staff. Please see Dr. Bonney RousselBoswell's note for complete details. I concur with his findings.  Inez CatalinaEmily B Mullen, MD 10/01/2015, 9:46 PM

## 2015-10-01 NOTE — H&P (View-Only) (Signed)
[facility] PROGRESS NOTE Subjective patient was just discharge yesterday after being in the hospital for G.I. bleeding that was felt to be diverticular. She had angiogram showing bleeding site at the splenic flexure. This was symbolized but did not clearly stop bleeding. She was observed in the hospital and consideration was given for colonoscopy versus surgery but she stopped bleeding and given her advanced age nothing was done. She 1 home and had one more episode of bright blood. Has had no further bleeding. No abdominal pain has had stable vital signs.  Objective: Vital signs in last 24 hours: Temp:  [98.3 F (36.8 C)-98.9 F (37.2 C)] 98.5 F (36.9 C) (05/11 0722) Pulse Rate:  [89-112] 89 (05/11 1015) Resp:  [16-25] 21 (05/11 1015) BP: (110-147)/(48-88) 144/77 mmHg (05/11 1015) SpO2:  [92 %-100 %] 96 % (05/11 1015) Weight:  [52.617 kg (116 lb)] 52.617 kg (116 lb) (05/10 1954)    Intake/Output from previous day:   Intake/Output this shift: Total I/O In: 807 [I.V.:500; Blood:307] Out: -   PE: General-- elderly female no acute distress Heart-- regular rate and rhythm murmurs or gallops Lungs-- clear Abdomen-- soft and nontender  Lab Results:  Recent Labs  09/28/15 0636 09/29/15 0537 09/29/15 2024 09/30/15 0435 09/30/15 1026  WBC 7.3 6.2 8.4 7.7  --   HGB 8.7* 8.4* 8.1* 7.0* 9.3*  HCT 26.8* 26.2* 26.2* 22.5* 27.9*  PLT 272 314 356 296  --    BMET  Recent Labs  09/28/15 0636 09/29/15 2024 09/30/15 0435  NA 142 136 141  K 4.3 4.6 4.2  CL 103 97* 100*  CO2 31 29 32  CREATININE 0.77 1.01* 0.93   LFT No results for input(s): PROT, AST, ALT, ALKPHOS, BILITOT, BILIDIR, IBILI in the last 72 hours. PT/INR No results for input(s): LABPROT, INR in the last 72 hours. PANCREAS No results for input(s): LIPASE in the last 72 hours.       Studies/Results: No results found.  Medications: I have reviewed the patient's current  medications.  Assessment/Plan: 1. LGI Bleed. This was documented to be at the splenic flexure stopped. She now has had another single episode. Suspect this is diverticular. AVMs rarely leave this rapidly. I think were going to be forced to evaluate this area just to be sure there is not anything else going on. Have discussed this with the patient and with her daughter go ahead and start her on NuLYTELY and plan colonoscopy and early a.m. tomorrow. This may not be a full colonoscopy if the bleeding cytosine at the splenic flexure area.   Hawken Bielby JR,Kalianne Fetting L 09/30/2015, 11:35 AM  This note was created using voice recognition software. Minor errors may Have occurred unintentionally.  Pager: [phone] If no answer or after hours call [phone]

## 2015-10-01 NOTE — Anesthesia Postprocedure Evaluation (Addendum)
Anesthesia Post Note  Patient: Sharon Conway  Procedure(s) Performed: Procedure(s) (LRB): COLONOSCOPY (N/A)  Patient location during evaluation: PACU Anesthesia Type: MAC Level of consciousness: awake and alert Pain management: pain level controlled Vital Signs Assessment: post-procedure vital signs reviewed and stable Respiratory status: spontaneous breathing, nonlabored ventilation, respiratory function stable and patient connected to nasal cannula oxygen Cardiovascular status: stable and blood pressure returned to baseline Anesthetic complications: no    Last Vitals:  Filed Vitals:   10/01/15 0132 10/01/15 0706  BP: 124/64 132/53  Pulse: 99 104  Temp: 36.9 C 37.1 C  Resp: 17 13    Last Pain: There were no vitals filed for this visit.               Reino KentJudd, Russel Morain J

## 2015-10-02 LAB — GLUCOSE, CAPILLARY
GLUCOSE-CAPILLARY: 267 mg/dL — AB (ref 65–99)
Glucose-Capillary: 144 mg/dL — ABNORMAL HIGH (ref 65–99)
Glucose-Capillary: 185 mg/dL — ABNORMAL HIGH (ref 65–99)
Glucose-Capillary: 202 mg/dL — ABNORMAL HIGH (ref 65–99)

## 2015-10-02 LAB — CBC
HCT: 28.5 % — ABNORMAL LOW (ref 36.0–46.0)
HEMATOCRIT: 28.4 % — AB (ref 36.0–46.0)
HEMOGLOBIN: 8.9 g/dL — AB (ref 12.0–15.0)
Hemoglobin: 8.8 g/dL — ABNORMAL LOW (ref 12.0–15.0)
MCH: 29.1 pg (ref 26.0–34.0)
MCH: 28.5 pg (ref 26.0–34.0)
MCHC: 31.3 g/dL (ref 30.0–36.0)
MCHC: 30.9 g/dL (ref 30.0–36.0)
MCV: 92.8 fL (ref 78.0–100.0)
MCV: 92.2 fL (ref 78.0–100.0)
PLATELETS: 317 10*3/uL (ref 150–400)
Platelets: 324 10*3/uL (ref 150–400)
RBC: 3.06 MIL/uL — ABNORMAL LOW (ref 3.87–5.11)
RBC: 3.09 MIL/uL — AB (ref 3.87–5.11)
RDW: 14.5 % (ref 11.5–15.5)
RDW: 14.5 % (ref 11.5–15.5)
WBC: 7 10*3/uL (ref 4.0–10.5)
WBC: 7.1 10*3/uL (ref 4.0–10.5)

## 2015-10-02 MED ORDER — SENNOSIDES-DOCUSATE SODIUM 8.6-50 MG PO TABS
1.0000 | ORAL_TABLET | Freq: Two times a day (BID) | ORAL | Status: DC
  Administered 2015-10-02 – 2015-10-03 (×3): 1 via ORAL
  Filled 2015-10-02 (×2): qty 1

## 2015-10-02 NOTE — Progress Notes (Signed)
Subjective: No abdominal pain. No further blood in stool.  Objective: Vital signs in last 24 hours: Temp:  [98.4 F (36.9 C)-98.8 F (37.1 C)] 98.4 F (36.9 C) (05/13 0329) Pulse Rate:  [89-113] 89 (05/13 0329) Resp:  [18-20] 20 (05/13 0329) BP: (141-156)/(68-71) 141/68 mmHg (05/13 0329) SpO2:  [98 %-100 %] 98 % (05/13 0329) Weight:  [54.976 kg (121 lb 3.2 oz)] 54.976 kg (121 lb 3.2 oz) (05/13 0329) Weight change: -0.181 kg (-6.4 oz) Last BM Date: 10/01/15  PE: GEN:  NAD, much younger-appearing than stated age, pleasant  Lab Results: CBC    Component Value Date/Time   WBC 7.0 10/02/2015 0936   RBC 3.06* 10/02/2015 0936   HGB 8.9* 10/02/2015 0936   HCT 28.4* 10/02/2015 0936   PLT 324 10/02/2015 0936   MCV 92.8 10/02/2015 0936   MCH 29.1 10/02/2015 0936   MCHC 31.3 10/02/2015 0936   RDW 14.5 10/02/2015 0936   LYMPHSABS 1.6 09/29/2015 2024   MONOABS 0.7 09/29/2015 2024   EOSABS 0.2 09/29/2015 2024   BASOSABS 0.0 09/29/2015 2024   CMP     Component Value Date/Time   NA 141 09/30/2015 0435   K 4.2 09/30/2015 0435   CL 100* 09/30/2015 0435   CO2 32 09/30/2015 0435   GLUCOSE 135* 09/30/2015 0435   BUN 8 09/30/2015 0435   CREATININE 0.93 09/30/2015 0435   CALCIUM 8.9 09/30/2015 0435   GFRNONAA 51* 09/30/2015 0435   GFRAA 59* 09/30/2015 0435   Assessment:  1.  Hematochezia, resolved, suspect diverticular bleeding from splenic flexure region. 2.  Acute blood loss anemia.  Hgb stable for past couple days.  Plan:  1.  Advance diet. 2.  If tolerates advance in diet, Hgb stable and no further bleeding, would consider discharge home tomorrow from GI perspective. 3.  [facility] will sign-off; please call with questions; we'll arrange outpatient follow-up with us; thank you for the consultation.   Freddy JakschOUTLAW,Londan Coplen M 10/02/2015, 1:53 PM   Pager [phone] If no answer or after 5 PM call [phone]

## 2015-10-02 NOTE — Progress Notes (Signed)
   Subjective: Patient reports no further bleeding. No BM yesterday or this AM. Feels fine. No complaints.   Objective: Vital signs in last 24 hours: Filed Vitals:   10/01/15 0905 10/01/15 1216 10/01/15 2003 10/02/15 0329  BP: 134/71 134/62 156/71 141/68  Pulse:  97 113 89  Temp:  98.9 F (37.2 C) 98.8 F (37.1 C) 98.4 F (36.9 C)  TempSrc:  Oral Oral Oral  Resp:  18 18 20   Height:      Weight:    121 lb 3.2 oz (54.976 kg)  SpO2:  100% 100% 98%   Weight change: -6.4 oz (-0.181 kg)  Intake/Output Summary (Last 24 hours) at 10/02/15 0729 Last data filed at 10/02/15 0328  Gross per 24 hour  Intake    957 ml  Output    325 ml  Net    632 ml    General: alert, lying comfortably in bed, NAD, AAOx3  Lungs: normal respiratory effort, no accessory muscle use, normal breath sounds, no crackles Heart:  regular rhythm, no murmur, no gallop, and no rub.  Abdomen: soft, NT, normal bowel sounds, no distention, no guarding, no rebound tenderness Pulses: 2+ DP/PT pulses bilaterally Extremities: no edmea Skin: turgor normal and no rashes.  Psych: normal mood and affect  Medications: I have reviewed the patient's current medications. Scheduled Meds: . feeding supplement (ENSURE ENLIVE)  237 mL Oral BID BM  . insulin aspart  0-5 Units Subcutaneous QHS  . insulin aspart  0-9 Units Subcutaneous TID WC  . multivitamin with minerals  1 tablet Oral Daily  . sodium chloride flush  3 mL Intravenous Q12H   Continuous Infusions:  PRN Meds:. Assessment/Plan:  Acute GI bleed localized to splenic flexure: Underwent colonoscopy 5/12. Extensive diverticular disease but no signs of bleeding. Hgb stable at 8.5 last night, this AM CBC is pending. Patient does not wish to have surgery for hemicolectomy. hgb stable at 8.9 today.  -  will discuss with GI about discharge planning. May start bleeding again if she has a bowel movement.  - will need very close follow up for monitoring re-bleeds and trending  CBC outpatient. She may need to go to short stay to get PRN transfusions.  -transfuse hgb <7.0 -continue telemetry -Appreciate GI and Surgery help  DM: -CBG qac and qhs -SSI-s  Htn: BP stable. Monitor.  -holding home amlodipine  DVT ppx: SCDs  CODE: FULL  Dispo: Disposition is deferred at this time, awaiting improvement of current medical problems.    The patient does have a current PCP (Pcp Not In System) and [facility] after discharge.  The patient does not have transportation limitations that hinder transportation to clinic appointments.  .Services Needed at time of discharge: Y = Yes, Blank = No PT:   OT:   RN:   Equipment:   Other:     LOS: 2 days   Sharon Meekerasrif Devarion Mcclanahan, MD  10/02/2015, 7:29 AM

## 2015-10-03 ENCOUNTER — Encounter (HOSPITAL_COMMUNITY): Payer: Self-pay | Admitting: Gastroenterology

## 2015-10-03 DIAGNOSIS — E119 Type 2 diabetes mellitus without complications: Secondary | ICD-10-CM

## 2015-10-03 DIAGNOSIS — K922 Gastrointestinal hemorrhage, unspecified: Secondary | ICD-10-CM | POA: Diagnosis not present

## 2015-10-03 DIAGNOSIS — E440 Moderate protein-calorie malnutrition: Secondary | ICD-10-CM | POA: Diagnosis not present

## 2015-10-03 DIAGNOSIS — I10 Essential (primary) hypertension: Secondary | ICD-10-CM | POA: Diagnosis not present

## 2015-10-03 LAB — CBC
HEMATOCRIT: 27.1 % — AB (ref 36.0–46.0)
Hemoglobin: 8.6 g/dL — ABNORMAL LOW (ref 12.0–15.0)
MCH: 29.5 pg (ref 26.0–34.0)
MCHC: 31.7 g/dL (ref 30.0–36.0)
MCV: 92.8 fL (ref 78.0–100.0)
PLATELETS: 312 10*3/uL (ref 150–400)
RBC: 2.92 MIL/uL — ABNORMAL LOW (ref 3.87–5.11)
RDW: 14.4 % (ref 11.5–15.5)
WBC: 7.2 10*3/uL (ref 4.0–10.5)

## 2015-10-03 LAB — GLUCOSE, CAPILLARY
Glucose-Capillary: 133 mg/dL — ABNORMAL HIGH (ref 65–99)
Glucose-Capillary: 231 mg/dL — ABNORMAL HIGH (ref 65–99)

## 2015-10-03 MED ORDER — ENSURE ENLIVE PO LIQD: 237.0000 mL | Freq: Two times a day (BID) | ORAL | Status: AC

## 2015-10-03 MED ORDER — SENNOSIDES-DOCUSATE SODIUM 8.6-50 MG PO TABS: 1.0000 | ORAL_TABLET | Freq: Two times a day (BID) | ORAL | Status: DC

## 2015-10-03 NOTE — Discharge Summary (Signed)
Name: Sharon Conway MRN: [id number] DOB: 15-Jul-1918 82 y.o. PCP: Pcp Not In System  Date of Admission: 09/29/2015 11:17 PM Date of Discharge: 10/03/2015 Attending Physician: Inez Catalina, MD  Discharge Diagnosis:  Active Problems:   GI bleeding   Diabetes mellitus type II, controlled (HCC)   GI bleed   Malnutrition of moderate degree  Discharge Medications:   Medication List    STOP taking these medications        amLODipine 5 MG tablet  Commonly known as:  NORVASC     meloxicam 15 MG tablet  Commonly known as:  MOBIC      TAKE these medications        feeding supplement (ENSURE ENLIVE) Liqd  Take 237 mLs by mouth 2 (two) times daily between meals.     metFORMIN 500 MG tablet  Commonly known as:  GLUCOPHAGE  Take 500 mg by mouth 2 (two) times daily with a meal.     pantoprazole 40 MG tablet  Commonly known as:  PROTONIX  Take 1 tablet (40 mg total) by mouth daily.     rOPINIRole 2 MG tablet  Commonly known as:  REQUIP  Take 2 mg by mouth at bedtime.     senna-docusate 8.6-50 MG tablet  Commonly known as:  Senokot-S  Take 1 tablet by mouth 2 (two) times daily.     solifenacin 5 MG tablet  Commonly known as:  VESICARE  Take 5 mg by mouth daily.        Disposition and follow-up:   Sharon Conway was discharged from [facility] in Fair condition.  At the hospital follow up visit please address:  1.  Please get CBC on follow up. If her hgb drops below 7.0, consider doing outpatient pRBC transfusion at the short stay.  Asked patient to follow up at the clinic for mild GI bleed. If she is having significant bleeding she would need to be admitted obviously. Otherwise, since she is a poor surgical candidate, we should attempt to avoid recurrent admissions for mild GI bleeds and try to do outpatient transfusions as needed.   2.  Labs / imaging needed at time of follow-up: CBC  3.  Pending labs/ test needing follow-up: none.  Follow-up  Appointments:     Follow-up Information    Schedule an appointment as soon as possible for a visit with [facility].   Contact information:   [address] [place] [address] [phone]      Discharge Instructions: Discharge Instructions    Increase activity slowly    Complete by:  As directed            Consultations: Treatment Team:  Md Ccs, MD Carman Ching, MD  Procedures Performed:  Nm Gi Blood Loss  09/22/2015  CLINICAL DATA:  Active GI bleed. EXAM: NUCLEAR MEDICINE GASTROINTESTINAL BLEEDING SCAN TECHNIQUE: Sequential abdominal images were obtained following intravenous administration of Tc-63m labeled red blood cells. RADIOPHARMACEUTICALS:  25.0 mCi Tc-63m in-vitro labeled red cells. COMPARISON:  None. FINDINGS: There is a tubular focus of increased uptake within the proximal left colon which increases in intensity and exhibits antegrade progression conforming to the expected configuration distal colon. Normal physiologic activity is identified within the liver, GU tract and blood pool. IMPRESSION: 1. Examination is positive for active GI bleed which appears to originate at the level of the splenic flexure. Electronically Signed   By: Signa Kell M.D.   On: 09/22/2015  16:01   Ir Angiogram Visceral Selective  09/23/2015  INDICATION: Gastrointestinal bleeding. Positive nuclear medicine imaging. Active bleeding from the splenic flexure noted. EXAM: SELECTIVE VISCERAL ARTERIOGRAPHY; IR ULTRASOUND GUIDANCE VASC ACCESS RIGHT MEDICATIONS: Ancef 1 g. The antibiotic was administered within 1 hour of the procedure ANESTHESIA/SEDATION: Moderate (conscious) sedation was employed during this procedure. A total of Versed 1.5 mg and Fentanyl 50 mcg was administered intravenously. Moderate Sedation Time: 80 minutes. The patient's level of consciousness and vital signs were monitored continuously by radiology nursing throughout the procedure under my  direct supervision. CONTRAST:  ISOVUE-300 IOPAMIDOL (ISOVUE-300) INJECTION 61% FLUOROSCOPY TIME:  Fluoroscopy Time: 26 minutes 48 seconds (210 mGy). COMPLICATIONS: None immediate. PROCEDURE: Informed consent was obtained from the patient following explanation of the procedure, risks, benefits and alternatives. The patient understands, agrees and consents for the procedure. All questions were addressed. A time out was performed prior to the initiation of the procedure. Maximal barrier sterile technique utilized including caps, mask, sterile gowns, sterile gloves, large sterile drape, hand hygiene, and Betadine prep. The right groin was prepped and draped in a sterile fashion. 1% lidocaine was utilized for local anesthesia. Under sonographic guidance, a micropuncture needle was inserted into the right common femoral artery and removed over a 018 wire which was up sized to a Bentson. A 5 French sheath was inserted. A micropuncture needle was then inserted into the right common femoral vein and removed over a 018 wire. A 5 French transitional dilator was inserted for venous access. A 5 French pigtail catheter was advanced into the aorta. Lateral aortography was performed demonstrating patency at the origin of the celiac, SMA, and IMA. A cobra catheter was advanced into the SMA and angiography was performed. This showed no source of gastrointestinal bleeding. The Cobra catheter was exchanged over a Bentson wire for a Sos Omni catheter. The IMA was selected and angiography was performed. Active extravasation at the splenic flexure was noted. A micro catheter was then advanced through the Sos Omni catheter and into the marginal artery of Drum[place]. Several contrast injections were performed. Active extravasation was again identified at the same location, however placement of the catheter into the culprit vessel was not possible. The micro catheter was removed. The Sos was advanced into the aorta and straightened with  the Bentson wire. Right femoral angiography was performed demonstrating a high profunda femoral artery takeoff. It was then removed. The sheath was removed and hemostasis was achieved with direct pressure. FINDINGS: Angiography of the SMA demonstrates no source of active bleeding. Selective injection of the IMA demonstrates active contrast extravasation at the splenic flexure. Competitive flow from the middle colicky artery is noted. Several subsequent contrast injections via a micro catheter in the marginal artery of Drummond demonstrate active extravasation at the splenic flexure. This occurs at a small arcade vessel from the branch of the marginal artery of Drummond at the watershed zone between the middle and left colloid arteries. IMPRESSION: Diagnostic angiogram demonstrates active contrast extravasation from a small arcade vessel of the left common artery at the splenic flexure. Embolization of this small vessel was unsuccessful as described above. At the end of the procedure, the patient's systolic blood pressure remained above 100 mm Hg with a pulse of 100. This was discussed with Dr. Mikey Bussing of the internal medicine teaching service. Electronically Signed   By: Jolaine Click M.D.   On: 09/23/2015 08:25   Ir Angiogram Visceral Selective  09/23/2015  INDICATION: Gastrointestinal bleeding. Positive nuclear medicine imaging. Active  bleeding from the splenic flexure noted. EXAM: SELECTIVE VISCERAL ARTERIOGRAPHY; IR ULTRASOUND GUIDANCE VASC ACCESS RIGHT MEDICATIONS: Ancef 1 g. The antibiotic was administered within 1 hour of the procedure ANESTHESIA/SEDATION: Moderate (conscious) sedation was employed during this procedure. A total of Versed 1.5 mg and Fentanyl 50 mcg was administered intravenously. Moderate Sedation Time: 80 minutes. The patient's level of consciousness and vital signs were monitored continuously by radiology nursing throughout the procedure under my direct supervision. CONTRAST:   ISOVUE-300 IOPAMIDOL (ISOVUE-300) INJECTION 61% FLUOROSCOPY TIME:  Fluoroscopy Time: 26 minutes 48 seconds (210 mGy). COMPLICATIONS: None immediate. PROCEDURE: Informed consent was obtained from the patient following explanation of the procedure, risks, benefits and alternatives. The patient understands, agrees and consents for the procedure. All questions were addressed. A time out was performed prior to the initiation of the procedure. Maximal barrier sterile technique utilized including caps, mask, sterile gowns, sterile gloves, large sterile drape, hand hygiene, and Betadine prep. The right groin was prepped and draped in a sterile fashion. 1% lidocaine was utilized for local anesthesia. Under sonographic guidance, a micropuncture needle was inserted into the right common femoral artery and removed over a 018 wire which was up sized to a Bentson. A 5 French sheath was inserted. A micropuncture needle was then inserted into the right common femoral vein and removed over a 018 wire. A 5 French transitional dilator was inserted for venous access. A 5 French pigtail catheter was advanced into the aorta. Lateral aortography was performed demonstrating patency at the origin of the celiac, SMA, and IMA. A cobra catheter was advanced into the SMA and angiography was performed. This showed no source of gastrointestinal bleeding. The Cobra catheter was exchanged over a Bentson wire for a Sos Omni catheter. The IMA was selected and angiography was performed. Active extravasation at the splenic flexure was noted. A micro catheter was then advanced through the Sos Omni catheter and into the marginal artery of Drummond. Several contrast injections were performed. Active extravasation was again identified at the same location, however placement of the catheter into the culprit vessel was not possible. The micro catheter was removed. The Sos was advanced into the aorta and straightened with the Bentson wire. Right femoral  angiography was performed demonstrating a high profunda femoral artery takeoff. It was then removed. The sheath was removed and hemostasis was achieved with direct pressure. FINDINGS: Angiography of the SMA demonstrates no source of active bleeding. Selective injection of the IMA demonstrates active contrast extravasation at the splenic flexure. Competitive flow from the middle colicky artery is noted. Several subsequent contrast injections via a micro catheter in the marginal artery of Drummond demonstrate active extravasation at the splenic flexure. This occurs at a small arcade vessel from the branch of the marginal artery of Drummond at the watershed zone between the middle and left colloid arteries. IMPRESSION: Diagnostic angiogram demonstrates active contrast extravasation from a small arcade vessel of the left common artery at the splenic flexure. Embolization of this small vessel was unsuccessful as described above. At the end of the procedure, the patient's systolic blood pressure remained above 100 mm Hg with a pulse of 100. This was discussed with Dr. Mikey Bussing of the internal medicine teaching service. Electronically Signed   By: Jolaine Click M.D.   On: 09/23/2015 08:25   Ir Angiogram Selective Each Additional Vessel  09/24/2015  CLINICAL DATA:  Gastrointestinal bleeding EXAM: ADDITIONAL ARTERIOGRAPHY COMPARISON:  None. FINDINGS: Please refer to accession # [id number]. IMPRESSION: Please refer to accession #  [id number]. Electronically Signed   By: Jolaine Click M.D.   On: 09/24/2015 08:38   Ir US Guide Vasc Access Right  09/23/2015  INDICATION: Gastrointestinal bleeding. Positive nuclear medicine imaging. Active bleeding from the splenic flexure noted. EXAM: SELECTIVE VISCERAL ARTERIOGRAPHY; IR ULTRASOUND GUIDANCE VASC ACCESS RIGHT MEDICATIONS: Ancef 1 g. The antibiotic was administered within 1 hour of the procedure ANESTHESIA/SEDATION: Moderate (conscious) sedation was employed during this procedure.  A total of Versed 1.5 mg and Fentanyl 50 mcg was administered intravenously. Moderate Sedation Time: 80 minutes. The patient's level of consciousness and vital signs were monitored continuously by radiology nursing throughout the procedure under my direct supervision. CONTRAST:  ISOVUE-300 IOPAMIDOL (ISOVUE-300) INJECTION 61% FLUOROSCOPY TIME:  Fluoroscopy Time: 26 minutes 48 seconds (210 mGy). COMPLICATIONS: None immediate. PROCEDURE: Informed consent was obtained from the patient following explanation of the procedure, risks, benefits and alternatives. The patient understands, agrees and consents for the procedure. All questions were addressed. A time out was performed prior to the initiation of the procedure. Maximal barrier sterile technique utilized including caps, mask, sterile gowns, sterile gloves, large sterile drape, hand hygiene, and Betadine prep. The right groin was prepped and draped in a sterile fashion. 1% lidocaine was utilized for local anesthesia. Under sonographic guidance, a micropuncture needle was inserted into the right common femoral artery and removed over a 018 wire which was up sized to a Bentson. A 5 French sheath was inserted. A micropuncture needle was then inserted into the right common femoral vein and removed over a 018 wire. A 5 French transitional dilator was inserted for venous access. A 5 French pigtail catheter was advanced into the aorta. Lateral aortography was performed demonstrating patency at the origin of the celiac, SMA, and IMA. A cobra catheter was advanced into the SMA and angiography was performed. This showed no source of gastrointestinal bleeding. The Cobra catheter was exchanged over a Bentson wire for a Sos Omni catheter. The IMA was selected and angiography was performed. Active extravasation at the splenic flexure was noted. A micro catheter was then advanced through the Sos Omni catheter and into the marginal artery of Drummond. Several contrast  injections were performed. Active extravasation was again identified at the same location, however placement of the catheter into the culprit vessel was not possible. The micro catheter was removed. The Sos was advanced into the aorta and straightened with the Bentson wire. Right femoral angiography was performed demonstrating a high profunda femoral artery takeoff. It was then removed. The sheath was removed and hemostasis was achieved with direct pressure. FINDINGS: Angiography of the SMA demonstrates no source of active bleeding. Selective injection of the IMA demonstrates active contrast extravasation at the splenic flexure. Competitive flow from the middle colicky artery is noted. Several subsequent contrast injections via a micro catheter in the marginal artery of Drummond demonstrate active extravasation at the splenic flexure. This occurs at a small arcade vessel from the branch of the marginal artery of Drummond at the watershed zone between the middle and left colloid arteries. IMPRESSION: Diagnostic angiogram demonstrates active contrast extravasation from a small arcade vessel of the left common artery at the splenic flexure. Embolization of this small vessel was unsuccessful as described above. At the end of the procedure, the patient's systolic blood pressure remained above 100 mm Hg with a pulse of 100. This was discussed with Dr. Mikey Bussing of the internal medicine teaching service. Electronically Signed   By: Jolaine Click M.D.   On: 09/23/2015 08:25  Dg Chest Portable 1 View  09/22/2015  CLINICAL DATA:  Shortness of breath on exertion with occasional cough. EXAM: PORTABLE CHEST 1 VIEW COMPARISON:  None. FINDINGS: Cardiomegaly. Aortic atherosclerosis. Hyperinflation. No active infiltrates or failure. No effusion or pneumothorax. Osteopenia. IMPRESSION: Cardiomegaly.  No active infiltrates or failure. Electronically Signed   By: Elsie StainJohn T Curnes M.D.   On: 09/22/2015 11:52    Admission HPI:   82 y/o  woman with PMHx significant for T2DM, arthritis, HTN, restless leg syndrome and recent anemia from lower GI bleed with hospitalization on 5/3 and discharged in the afternoon on 5/10 went home for a few hours before she tried to pass a bowel movement and passed large bright red blood. There was no associated abdominal or rectal pain. She denies any moments of lightheadedness, chest pain, or shortness of breath during or after this. On presentation to the ED bright red blood was noted in the rectal vault with no stool but has not passed any other blood. Hgb was checked at 8.1 slightly decreased from 8.4 earlier today.  During her previous few days of hospitalization she received a total of 6 units pRBCs. A splenic flexure bleed was identified but unable to be controlled via IR embolization. She was evaluated by the general surgery and gastroenterology services but her bleeding had stopped after several days of supportive care alone.  Hospital Course by problem list: Active Problems:   GI bleeding   Diabetes mellitus type II, controlled (HCC)   GI bleed   Malnutrition of moderate degree   Acute GI bleed localized to splenic flexure on RBC tagged study: Patient's previous admission RBC tagged study located bleeding source at the splenic flexure but IR could not successfully embolize the source. She continued to have some bleeding but was deemed high risk for cscope during that time. Surgery was also consulted and they thought she would be high risk for surgery with her advanced age. She was stabilized with PRBC and sent home. She came back with recurrence of GI bleed. hgb was 7.0. Received 1 unit of pRBC this admisison. Her blood count has been stable around mid 8's since then.   Underwent colonoscopy 5/12. Showed extensive diverticular disease but no signs of bleeding seen on cscope. Hgb stable around mid 8's for last 3 days GI has signed off with no further active bleeding currently. However, she is at  risk of bleeding in the future. Explained this to the daughter that she will need to be followed closely as outpatient with CBC trending. If she starts bleeding any profusely she will need surgery.  - will discharge home today with [facility]. May need outpatient transfusion if CBC trends down at follow up.  UY:QIHKM:cont home metformin on discharge. Was on SSI here.   Htn: BP stable in the hospital without any meds. -d/ced home amlodipine on discharge as her BP was normal here. If she starts bleeding, being on amlodipine may drop her pressure too much. This can be adjusted outpatient.     Discharge Vitals:   BP 125/57 mmHg  Pulse 98  Temp(Src) 98.4 F (36.9 C) (Oral)  Resp 18  Ht 5\' 2"  (1.575 m)  Wt 119 lb 4.8 oz (54.114 kg)  BMI 21.81 kg/m2  SpO2 96%  Discharge Labs:  Results for orders placed or performed during the hospital encounter of 09/29/15 (from the past 24 hour(s))  Glucose, capillary     Status: Abnormal   Collection Time: 10/02/15  4:37 PM  Result Value Ref Range   Glucose-Capillary 185 (H) 65 - 99 mg/dL  CBC     Status: Abnormal   Collection Time: 10/02/15  6:11 PM  Result Value Ref Range   WBC 7.1 4.0 - 10.5 K/uL   RBC 3.09 (L) 3.87 - 5.11 MIL/uL   Hemoglobin 8.8 (L) 12.0 - 15.0 g/dL   HCT 16.1 (L) 09.6 - 04.5 %   MCV 92.2 78.0 - 100.0 fL   MCH 28.5 26.0 - 34.0 pg   MCHC 30.9 30.0 - 36.0 g/dL   RDW 40.9 81.1 - 91.4 %   Platelets 317 150 - 400 K/uL  Glucose, capillary     Status: Abnormal   Collection Time: 10/02/15 10:22 PM  Result Value Ref Range   Glucose-Capillary 202 (H) 65 - 99 mg/dL   Comment 1 Q   Glucose, capillary     Status: Abnormal   Collection Time: 10/03/15  6:09 AM  Result Value Ref Range   Glucose-Capillary 133 (H) 65 - 99 mg/dL  CBC     Status: Abnormal   Collection Time: 10/03/15  8:57 AM  Result Value Ref Range   WBC 7.2 4.0 - 10.5 K/uL   RBC 2.92 (L) 3.87 - 5.11 MIL/uL   Hemoglobin 8.6 (L) 12.0 - 15.0 g/dL   HCT 78.2 (L)  95.6 - 46.0 %   MCV 92.8 78.0 - 100.0 fL   MCH 29.5 26.0 - 34.0 pg   MCHC 31.7 30.0 - 36.0 g/dL   RDW 21.3 08.6 - 57.8 %   Platelets 312 150 - 400 K/uL    Signed: Hyacinth Meeker, MD 10/03/2015, 11:30 AM    Services Ordered on Discharge: home health PT/RN. Equipment Ordered on Discharge:

## 2015-10-03 NOTE — Care Management Note (Signed)
Case Management Note  Patient Details  Name: Sharon Conway MRN: [id number] Date of Birth: July 30, 1918  Subjective/Objective:                    Action/Plan: CM met with patient and spoke with daughter Sharon Conway [phone], to discuss transitional care, patient lives with daughter.  Patient has recently moved to [place] from a small town in [place] so daughter could care for her. Patient will be establishing care with [facility] Discussed the  The recommendations for [facility] service, patient and daughter are agreeable. Offered choice, daughter selected  [place] Liaison for [facility] was contacted to confirm services. Explained that a nurse from [facility] will contact patient by phone. Patient and daughter verbalized understanding teach back done. Also discussed [facility] community services, patient and daughter were agreeable referral place. No furhter questions or concerns verbalized. No further CM needs identified. Expected Discharge Date:  10/01/15               Expected Discharge Plan:  [place]  In-House Referral:     Discharge planning Services  CM Consult  Post Acute Care Choice:  Home Health Choice offered to:   patient and daughter  DME Arranged:    DME Agency:  NA  HH Arranged:  RN, PT [place] Agency:  [place]  Status of Service:  Completed, signed off  Medicare Important Message Given:    yes Date Medicare IM Given:    Medicare IM give by:   Wendi Maya RN Date Additional Medicare IM Given:    Additional Medicare Important Message give by:     If discussed at [place] of Stay Meetings, dates discussed:    Additional CommentsLaurena Slimmer, RN 10/03/2015, 2:54 PM  [phone]

## 2015-10-03 NOTE — Discharge Instructions (Signed)
Please come back to our clinic for follow up. They should give you a call next week.  If you have mild bleeding, go to the clinic to get checked. If you are having large amount of bleeding, please come back to the emergency room.   Gastrointestinal Bleeding Gastrointestinal bleeding is bleeding somewhere along the path that food travels through the body (digestive tract). This path is anywhere between the mouth and the opening of the butt (anus). You may have blood in your throw up (vomit) or in your poop (stools). If there is a lot of bleeding, you may need to stay in the hospital. HOME CARE  Only take medicine as told by your doctor.  Eat foods with fiber such as whole grains, fruits, and vegetables. You can also try eating 1 to 3 prunes a day.  Drink enough fluids to keep your pee (urine) clear or pale yellow. GET HELP RIGHT AWAY IF:   Your bleeding gets worse.  You feel dizzy, weak, or you pass out (faint).  You have bad cramps in your back or belly (abdomen).  You have large blood clumps (clots) in your poop.  Your problems are getting worse. MAKE SURE YOU:   Understand these instructions.  Will watch your condition.  Will get help right away if you are not doing well or get worse.   This information is not intended to replace advice given to you by your health care provider. Make sure you discuss any questions you have with your health care provider.   Document Released: 02/15/2008 Document Revised: 04/24/2012 Document Reviewed: 10/26/2014 Elsevier Interactive Patient Education [employer]2016 Elsevier Inc.

## 2015-10-03 NOTE — Care Management Important Message (Signed)
Important Message  Patient Details  Name: Sharon Conway Ismael MRN: [id number] Date of Birth: 14-Feb-1919   Medicare Important Message Given:  Waylan BogaYes    [facility] Antolin, RN 10/03/2015, 3:14 PM

## 2015-10-03 NOTE — Progress Notes (Signed)
   Subjective: Patient reports no further bleeding overnight. No BM yesterday or this AM. Feels fine. No complaints.   Objective: Vital signs in last 24 hours: Filed Vitals:   10/02/15 1500 10/02/15 2046 10/03/15 0500 10/03/15 0533  BP: 160/55 141/59  125/57  Pulse: 82 57  98  Temp: 98.6 F (37 C) 99 F (37.2 C)  98.4 F (36.9 C)  TempSrc: Oral Oral  Oral  Resp: 18 18  18   Height:      Weight:   119 lb 4.8 oz (54.114 kg)   SpO2: 98% 99%  96%   Weight change: 4.8 oz (0.136 kg)  Intake/Output Summary (Last 24 hours) at 10/03/15 1120 Last data filed at 10/03/15 1117  Gross per 24 hour  Intake    680 ml  Output    750 ml  Net    -70 ml    General: alert, lying comfortably in bed, NAD, AAOx3  Lungs: normal respiratory effort, no accessory muscle use, normal breath sounds, no crackles Heart:  regular rhythm, no murmur, no gallop, and no rub.  Abdomen: soft, NT, normal bowel sounds, no distention, no guarding, no rebound tenderness Pulses: 2+ DP/PT pulses bilaterally Extremities: no edmea Skin: turgor normal and no rashes.  Psych: normal mood and affect  Medications: I have reviewed the patient's current medications. Scheduled Meds: . feeding supplement (ENSURE ENLIVE)  237 mL Oral BID BM  . insulin aspart  0-5 Units Subcutaneous QHS  . insulin aspart  0-9 Units Subcutaneous TID WC  . multivitamin with minerals  1 tablet Oral Daily  . senna-docusate  1 tablet Oral BID  . sodium chloride flush  3 mL Intravenous Q12H   Continuous Infusions:  PRN Meds:. Assessment/Plan:  Acute GI bleed localized to splenic flexure on RBC tagged study: Underwent colonoscopy 5/12. Extensive diverticular disease but no signs of bleeding seen on cscope. Hgb stable around mid 8's for last 3 days  GI has signed off with no further active bleeding currently. However, she is at risk of bleeding in the future. Explained this to the daughter that she will need to be followed closely as outpatient  with CBC trending. If she starts bleeding any profusely she will need surgery.  - will discharge home today with [facility]. May need outpatient transfusion if CBC trends down at follow up.   DM: -CBG qac and qhs -SSI-s  Htn: BP stable. Monitor.  -holding home amlodipine  DVT ppx: SCDs  CODE: FULL  Dispo: Disposition is deferred at this time, awaiting improvement of current medical problems.    The patient does have a current PCP (Pcp Not In System) and does [facility] after discharge.  The patient does not have transportation limitations that hinder transportation to clinic appointments.  .Services Needed at time of discharge: Y = Yes, Blank = No PT:   OT:   RN:   Equipment:   Other:     LOS: 3 days   Hyacinth Meekerasrif Xhaiden Coombs, MD  10/03/2015, 11:20 AM

## 2015-10-06 DIAGNOSIS — I10 Essential (primary) hypertension: Secondary | ICD-10-CM | POA: Diagnosis not present

## 2015-10-06 DIAGNOSIS — K922 Gastrointestinal hemorrhage, unspecified: Secondary | ICD-10-CM | POA: Diagnosis not present

## 2015-10-06 DIAGNOSIS — Z7984 Long term (current) use of oral hypoglycemic drugs: Secondary | ICD-10-CM | POA: Diagnosis not present

## 2015-10-06 DIAGNOSIS — E440 Moderate protein-calorie malnutrition: Secondary | ICD-10-CM | POA: Diagnosis not present

## 2015-10-06 DIAGNOSIS — E119 Type 2 diabetes mellitus without complications: Secondary | ICD-10-CM | POA: Diagnosis not present

## 2015-10-06 DIAGNOSIS — M150 Primary generalized (osteo)arthritis: Secondary | ICD-10-CM | POA: Diagnosis not present

## 2015-10-07 ENCOUNTER — Encounter: Payer: Self-pay | Admitting: Internal Medicine

## 2015-10-07 DIAGNOSIS — E119 Type 2 diabetes mellitus without complications: Secondary | ICD-10-CM | POA: Diagnosis not present

## 2015-10-07 DIAGNOSIS — Z7984 Long term (current) use of oral hypoglycemic drugs: Secondary | ICD-10-CM | POA: Diagnosis not present

## 2015-10-07 DIAGNOSIS — I10 Essential (primary) hypertension: Secondary | ICD-10-CM | POA: Diagnosis not present

## 2015-10-07 DIAGNOSIS — K922 Gastrointestinal hemorrhage, unspecified: Secondary | ICD-10-CM | POA: Diagnosis not present

## 2015-10-07 DIAGNOSIS — M150 Primary generalized (osteo)arthritis: Secondary | ICD-10-CM | POA: Diagnosis not present

## 2015-10-07 DIAGNOSIS — E440 Moderate protein-calorie malnutrition: Secondary | ICD-10-CM | POA: Diagnosis not present

## 2015-10-08 DIAGNOSIS — K922 Gastrointestinal hemorrhage, unspecified: Secondary | ICD-10-CM | POA: Diagnosis not present

## 2015-10-08 DIAGNOSIS — M150 Primary generalized (osteo)arthritis: Secondary | ICD-10-CM | POA: Diagnosis not present

## 2015-10-08 DIAGNOSIS — E119 Type 2 diabetes mellitus without complications: Secondary | ICD-10-CM | POA: Diagnosis not present

## 2015-10-08 DIAGNOSIS — Z7984 Long term (current) use of oral hypoglycemic drugs: Secondary | ICD-10-CM | POA: Diagnosis not present

## 2015-10-08 DIAGNOSIS — I10 Essential (primary) hypertension: Secondary | ICD-10-CM | POA: Diagnosis not present

## 2015-10-08 DIAGNOSIS — E440 Moderate protein-calorie malnutrition: Secondary | ICD-10-CM | POA: Diagnosis not present

## 2015-10-11 DIAGNOSIS — Z7984 Long term (current) use of oral hypoglycemic drugs: Secondary | ICD-10-CM | POA: Diagnosis not present

## 2015-10-11 DIAGNOSIS — E119 Type 2 diabetes mellitus without complications: Secondary | ICD-10-CM | POA: Diagnosis not present

## 2015-10-11 DIAGNOSIS — M150 Primary generalized (osteo)arthritis: Secondary | ICD-10-CM | POA: Diagnosis not present

## 2015-10-11 DIAGNOSIS — K5731 Diverticulosis of large intestine without perforation or abscess with bleeding: Secondary | ICD-10-CM | POA: Diagnosis not present

## 2015-10-11 DIAGNOSIS — I10 Essential (primary) hypertension: Secondary | ICD-10-CM | POA: Diagnosis not present

## 2015-10-11 DIAGNOSIS — E440 Moderate protein-calorie malnutrition: Secondary | ICD-10-CM | POA: Diagnosis not present

## 2015-10-11 DIAGNOSIS — K922 Gastrointestinal hemorrhage, unspecified: Secondary | ICD-10-CM | POA: Diagnosis not present

## 2015-10-12 DIAGNOSIS — I10 Essential (primary) hypertension: Secondary | ICD-10-CM | POA: Diagnosis not present

## 2015-10-12 DIAGNOSIS — Z7984 Long term (current) use of oral hypoglycemic drugs: Secondary | ICD-10-CM | POA: Diagnosis not present

## 2015-10-12 DIAGNOSIS — M150 Primary generalized (osteo)arthritis: Secondary | ICD-10-CM | POA: Diagnosis not present

## 2015-10-12 DIAGNOSIS — E440 Moderate protein-calorie malnutrition: Secondary | ICD-10-CM | POA: Diagnosis not present

## 2015-10-12 DIAGNOSIS — E119 Type 2 diabetes mellitus without complications: Secondary | ICD-10-CM | POA: Diagnosis not present

## 2015-10-12 DIAGNOSIS — K922 Gastrointestinal hemorrhage, unspecified: Secondary | ICD-10-CM | POA: Diagnosis not present

## 2015-10-13 DIAGNOSIS — E440 Moderate protein-calorie malnutrition: Secondary | ICD-10-CM | POA: Diagnosis not present

## 2015-10-13 DIAGNOSIS — E119 Type 2 diabetes mellitus without complications: Secondary | ICD-10-CM | POA: Diagnosis not present

## 2015-10-13 DIAGNOSIS — I10 Essential (primary) hypertension: Secondary | ICD-10-CM | POA: Diagnosis not present

## 2015-10-13 DIAGNOSIS — K922 Gastrointestinal hemorrhage, unspecified: Secondary | ICD-10-CM | POA: Diagnosis not present

## 2015-10-13 DIAGNOSIS — M150 Primary generalized (osteo)arthritis: Secondary | ICD-10-CM | POA: Diagnosis not present

## 2015-10-13 DIAGNOSIS — Z7984 Long term (current) use of oral hypoglycemic drugs: Secondary | ICD-10-CM | POA: Diagnosis not present

## 2015-10-15 DIAGNOSIS — Z7984 Long term (current) use of oral hypoglycemic drugs: Secondary | ICD-10-CM | POA: Diagnosis not present

## 2015-10-15 DIAGNOSIS — K922 Gastrointestinal hemorrhage, unspecified: Secondary | ICD-10-CM | POA: Diagnosis not present

## 2015-10-15 DIAGNOSIS — I10 Essential (primary) hypertension: Secondary | ICD-10-CM | POA: Diagnosis not present

## 2015-10-15 DIAGNOSIS — E119 Type 2 diabetes mellitus without complications: Secondary | ICD-10-CM | POA: Diagnosis not present

## 2015-10-15 DIAGNOSIS — E440 Moderate protein-calorie malnutrition: Secondary | ICD-10-CM | POA: Diagnosis not present

## 2015-10-15 DIAGNOSIS — M150 Primary generalized (osteo)arthritis: Secondary | ICD-10-CM | POA: Diagnosis not present

## 2015-10-19 DIAGNOSIS — E119 Type 2 diabetes mellitus without complications: Secondary | ICD-10-CM | POA: Diagnosis not present

## 2015-10-19 DIAGNOSIS — Z7984 Long term (current) use of oral hypoglycemic drugs: Secondary | ICD-10-CM | POA: Diagnosis not present

## 2015-10-19 DIAGNOSIS — I10 Essential (primary) hypertension: Secondary | ICD-10-CM | POA: Diagnosis not present

## 2015-10-19 DIAGNOSIS — M150 Primary generalized (osteo)arthritis: Secondary | ICD-10-CM | POA: Diagnosis not present

## 2015-10-19 DIAGNOSIS — E440 Moderate protein-calorie malnutrition: Secondary | ICD-10-CM | POA: Diagnosis not present

## 2015-10-19 DIAGNOSIS — K922 Gastrointestinal hemorrhage, unspecified: Secondary | ICD-10-CM | POA: Diagnosis not present

## 2015-10-21 DIAGNOSIS — E440 Moderate protein-calorie malnutrition: Secondary | ICD-10-CM | POA: Diagnosis not present

## 2015-10-21 DIAGNOSIS — Z7984 Long term (current) use of oral hypoglycemic drugs: Secondary | ICD-10-CM | POA: Diagnosis not present

## 2015-10-21 DIAGNOSIS — K922 Gastrointestinal hemorrhage, unspecified: Secondary | ICD-10-CM | POA: Diagnosis not present

## 2015-10-21 DIAGNOSIS — E119 Type 2 diabetes mellitus without complications: Secondary | ICD-10-CM | POA: Diagnosis not present

## 2015-10-21 DIAGNOSIS — M150 Primary generalized (osteo)arthritis: Secondary | ICD-10-CM | POA: Diagnosis not present

## 2015-10-21 DIAGNOSIS — I10 Essential (primary) hypertension: Secondary | ICD-10-CM | POA: Diagnosis not present

## 2015-10-22 DIAGNOSIS — K922 Gastrointestinal hemorrhage, unspecified: Secondary | ICD-10-CM | POA: Diagnosis not present

## 2015-10-22 DIAGNOSIS — E119 Type 2 diabetes mellitus without complications: Secondary | ICD-10-CM | POA: Diagnosis not present

## 2015-10-22 DIAGNOSIS — M150 Primary generalized (osteo)arthritis: Secondary | ICD-10-CM | POA: Diagnosis not present

## 2015-10-22 DIAGNOSIS — Z7984 Long term (current) use of oral hypoglycemic drugs: Secondary | ICD-10-CM | POA: Diagnosis not present

## 2015-10-22 DIAGNOSIS — E440 Moderate protein-calorie malnutrition: Secondary | ICD-10-CM | POA: Diagnosis not present

## 2015-10-22 DIAGNOSIS — I10 Essential (primary) hypertension: Secondary | ICD-10-CM | POA: Diagnosis not present

## 2015-10-25 ENCOUNTER — Telehealth: Payer: Self-pay | Admitting: Internal Medicine

## 2015-10-25 DIAGNOSIS — M150 Primary generalized (osteo)arthritis: Secondary | ICD-10-CM | POA: Diagnosis not present

## 2015-10-25 DIAGNOSIS — E119 Type 2 diabetes mellitus without complications: Secondary | ICD-10-CM | POA: Diagnosis not present

## 2015-10-25 DIAGNOSIS — Z7984 Long term (current) use of oral hypoglycemic drugs: Secondary | ICD-10-CM | POA: Diagnosis not present

## 2015-10-25 DIAGNOSIS — K922 Gastrointestinal hemorrhage, unspecified: Secondary | ICD-10-CM | POA: Diagnosis not present

## 2015-10-25 DIAGNOSIS — E440 Moderate protein-calorie malnutrition: Secondary | ICD-10-CM | POA: Diagnosis not present

## 2015-10-25 DIAGNOSIS — I10 Essential (primary) hypertension: Secondary | ICD-10-CM | POA: Diagnosis not present

## 2015-10-25 NOTE — Telephone Encounter (Addendum)
Pt need TOC Discharge date 10/15/15 HFU 10/28/15

## 2015-10-26 ENCOUNTER — Other Ambulatory Visit: Payer: Self-pay | Admitting: Internal Medicine

## 2015-10-26 DIAGNOSIS — K922 Gastrointestinal hemorrhage, unspecified: Secondary | ICD-10-CM | POA: Diagnosis not present

## 2015-10-26 DIAGNOSIS — E119 Type 2 diabetes mellitus without complications: Secondary | ICD-10-CM | POA: Diagnosis not present

## 2015-10-26 DIAGNOSIS — I10 Essential (primary) hypertension: Secondary | ICD-10-CM | POA: Diagnosis not present

## 2015-10-26 DIAGNOSIS — E440 Moderate protein-calorie malnutrition: Secondary | ICD-10-CM | POA: Diagnosis not present

## 2015-10-26 DIAGNOSIS — M150 Primary generalized (osteo)arthritis: Secondary | ICD-10-CM | POA: Diagnosis not present

## 2015-10-26 DIAGNOSIS — Z7984 Long term (current) use of oral hypoglycemic drugs: Secondary | ICD-10-CM | POA: Diagnosis not present

## 2015-10-26 NOTE — Telephone Encounter (Signed)
Transition Care Management Follow-up Telephone Call   Date discharged? 10/15/2015   How have you been since you were released from the hospital? Daughter states that she is doing well. No recurrent bloody stools or other complaints.    Do you understand why you were in the hospital? yes   Do you understand the discharge instructions? yes   Where were you discharged to? Home     Items Reviewed:  Medications reviewed: yes  Allergies reviewed: yes  Dietary changes reviewed: yes  Referrals reviewed: yes   Functional Questionnaire:   Activities of Daily Living (ADLs):   She states they are independent in the following: ambulation, feeding, continence and toileting States they require assistance with the following: bathing and hygiene, grooming and dressing   Any transportation issues/concerns?: no   Any patient concerns? no   Confirmed importance and date/time of follow-up visits scheduled yes, 10/28/2015 at 3:15  Provider Appointment booked with Dr. Tasia CatchingsAhmed  Confirmed with patient if condition begins to worsen call PCP or go to the ER.  Patient was given the office number and encouraged to call back with question or concerns.  : yes

## 2015-10-27 DIAGNOSIS — E119 Type 2 diabetes mellitus without complications: Secondary | ICD-10-CM | POA: Diagnosis not present

## 2015-10-27 DIAGNOSIS — Z7984 Long term (current) use of oral hypoglycemic drugs: Secondary | ICD-10-CM | POA: Diagnosis not present

## 2015-10-27 DIAGNOSIS — M150 Primary generalized (osteo)arthritis: Secondary | ICD-10-CM | POA: Diagnosis not present

## 2015-10-27 DIAGNOSIS — I10 Essential (primary) hypertension: Secondary | ICD-10-CM | POA: Diagnosis not present

## 2015-10-27 DIAGNOSIS — K922 Gastrointestinal hemorrhage, unspecified: Secondary | ICD-10-CM | POA: Diagnosis not present

## 2015-10-27 DIAGNOSIS — E440 Moderate protein-calorie malnutrition: Secondary | ICD-10-CM | POA: Diagnosis not present

## 2015-10-28 ENCOUNTER — Encounter: Payer: Self-pay | Admitting: Internal Medicine

## 2015-10-28 ENCOUNTER — Ambulatory Visit (INDEPENDENT_AMBULATORY_CARE_PROVIDER_SITE_OTHER): Payer: Medicare Other | Admitting: Internal Medicine

## 2015-10-28 VITALS — BP 126/78 | HR 94 | Temp 98.7°F | Ht 63.0 in | Wt 123.8 lb

## 2015-10-28 DIAGNOSIS — Z09 Encounter for follow-up examination after completed treatment for conditions other than malignant neoplasm: Secondary | ICD-10-CM | POA: Diagnosis not present

## 2015-10-28 DIAGNOSIS — E119 Type 2 diabetes mellitus without complications: Secondary | ICD-10-CM

## 2015-10-28 DIAGNOSIS — D539 Nutritional anemia, unspecified: Secondary | ICD-10-CM | POA: Diagnosis not present

## 2015-10-28 DIAGNOSIS — K5793 Diverticulitis of intestine, part unspecified, without perforation or abscess with bleeding: Secondary | ICD-10-CM | POA: Diagnosis not present

## 2015-10-28 DIAGNOSIS — D649 Anemia, unspecified: Secondary | ICD-10-CM | POA: Diagnosis not present

## 2015-10-28 DIAGNOSIS — Z8719 Personal history of other diseases of the digestive system: Secondary | ICD-10-CM

## 2015-10-28 DIAGNOSIS — Z7984 Long term (current) use of oral hypoglycemic drugs: Secondary | ICD-10-CM

## 2015-10-28 LAB — CBC
HEMATOCRIT: 31.3 % — AB (ref 36.0–46.0)
HEMOGLOBIN: 9.4 g/dL — AB (ref 12.0–15.0)
MCH: 26.2 pg (ref 26.0–34.0)
MCHC: 30 g/dL (ref 30.0–36.0)
MCV: 87.2 fL (ref 78.0–100.0)
Platelets: 270 10*3/uL (ref 150–400)
RBC: 3.59 MIL/uL — AB (ref 3.87–5.11)
RDW: 13.7 % (ref 11.5–15.5)
WBC: 6.8 10*3/uL (ref 4.0–10.5)

## 2015-10-28 LAB — POCT GLYCOSYLATED HEMOGLOBIN (HGB A1C): Hemoglobin A1C: 6.3

## 2015-10-28 LAB — GLUCOSE, CAPILLARY: Glucose-Capillary: 140 mg/dL — ABNORMAL HIGH (ref 65–99)

## 2015-10-28 NOTE — Progress Notes (Signed)
   Subjective:    Patient ID: Sharon Conway, female    DOB: 1919/05/14, 82 y.o.   MRN: [id number]  HPI  82 yo pleasant female with hx of DM II, recent hospitalization for GI bleed is here for follow up on GI bleed and DM II.  GI bleed - please see Discharge summary from 10/03/15 for details. I had this patient on my team. She was admitted for bright red blood per rectum, had a RBC tagged study localizing source to the splenic flexure but failed embolization. Surgery was consulted but deemed her high risk for surgery. Her hgb was stable and she was discharged but then was readmitted shortly within few hours after having another episode of bloody BM. GI did a cscope on the 2nd admission which showed severe diverticulosis without any active source of bleeding. She was in the hospital several days without any more bleeding. Her hgb remained stable at 8's for few days before being discharge homed.   She is doing well. Denies any more episodes of rectal bleeding, dark stool, or hematemesis. Denies dizziness, abd pain, or other symptoms.   DM II - hgba1c today 6.3. We don't have previous results. On metformin 500mg  bid.     Review of Systems  Constitutional: Negative for fever, chills and fatigue.  HENT: Negative for congestion and sore throat.   Respiratory: Negative for chest tightness and shortness of breath.   Gastrointestinal: Negative for nausea, vomiting, abdominal pain, blood in stool, abdominal distention and rectal pain.  Genitourinary: Negative for dysuria.  Neurological: Negative for dizziness and headaches.       Objective:   Physical Exam  Constitutional: She is oriented to person, place, and time. She appears well-developed and well-nourished.  Pleasant elderly lady.  HENT:  Head: Normocephalic and atraumatic.  Eyes: Conjunctivae are normal. Right eye exhibits no discharge. Left eye exhibits no discharge. No scleral icterus.  Neck: Normal range of motion.  Cardiovascular: Normal  rate and regular rhythm.  Exam reveals no gallop and no friction rub.   No murmur heard. Pulmonary/Chest: Effort normal and breath sounds normal. No respiratory distress. She has no wheezes.  Abdominal: Soft. Bowel sounds are normal. She exhibits no distension. There is no tenderness.  Musculoskeletal: Normal range of motion. She exhibits no edema or tenderness.  Neurological: She is alert and oriented to person, place, and time. No cranial nerve deficit.    Filed Vitals:   10/28/15 1526 10/28/15 1552  BP: 157/68 126/78  Pulse: 94   Temp: 98.7 F (37.1 C)          Assessment & Plan:  See problem based a&p.

## 2015-10-28 NOTE — Assessment & Plan Note (Addendum)
Has chronic anemia, MCV is normal. Could be a mixed cause from iron def +/- b12/folate def.   hgb stable/improved to 9.4 from 8.6 on discharge.  Will check Iron, ferritin, B12, folate.   Iron and ferritin are low. Will send iron pill to pharmacy to take once daily for now.

## 2015-10-28 NOTE — Patient Instructions (Addendum)
I am glad you are doing well.  Continue your current medication.  If you have any more bleeding, please call us and we will guide you further. If it's a small bleed, we can see you in the clinic. For larger blood loss, you should go to the hospital.    You can also call our after hours number so someone should be able to help you 24/7.

## 2015-10-28 NOTE — Assessment & Plan Note (Signed)
hgba1c 6.3. On metformin 500mg  bid.  -cont metformin 500mg  bid.

## 2015-10-28 NOTE — Assessment & Plan Note (Signed)
No further GI bleed after discharge. hgb checked today stable/improved to 9.4.  Asked patient to continue to monitor for GI bleed and to call us if she has a milder episode. If she has significant blood loss, asked her to go to the ED.

## 2015-10-29 LAB — IRON AND TIBC
IRON SATURATION: 6 % — AB (ref 15–55)
Iron: 27 ug/dL (ref 27–139)
TIBC: 417 ug/dL (ref 250–450)
UIBC: 390 ug/dL — AB (ref 118–369)

## 2015-10-29 LAB — FOLATE RBC
Folate, Hemolysate: 409.4 ng/mL
Folate, RBC: 1402 ng/mL (ref >498)
Hematocrit: 29.2 % — ABNORMAL LOW (ref 34.0–46.6)

## 2015-10-29 LAB — FERRITIN: Ferritin: 14 ng/mL — ABNORMAL LOW (ref 15–150)

## 2015-10-29 LAB — VITAMIN B12: VITAMIN B 12: 541 pg/mL (ref 211–946)

## 2015-10-29 MED ORDER — POLYSACCHARIDE IRON COMPLEX 150 MG PO CAPS: 150.0000 mg | ORAL_CAPSULE | Freq: Every day | ORAL | Status: DC

## 2015-11-01 DIAGNOSIS — M150 Primary generalized (osteo)arthritis: Secondary | ICD-10-CM | POA: Diagnosis not present

## 2015-11-01 DIAGNOSIS — K922 Gastrointestinal hemorrhage, unspecified: Secondary | ICD-10-CM | POA: Diagnosis not present

## 2015-11-01 DIAGNOSIS — E440 Moderate protein-calorie malnutrition: Secondary | ICD-10-CM | POA: Diagnosis not present

## 2015-11-01 DIAGNOSIS — E119 Type 2 diabetes mellitus without complications: Secondary | ICD-10-CM | POA: Diagnosis not present

## 2015-11-01 DIAGNOSIS — I10 Essential (primary) hypertension: Secondary | ICD-10-CM | POA: Diagnosis not present

## 2015-11-01 DIAGNOSIS — Z7984 Long term (current) use of oral hypoglycemic drugs: Secondary | ICD-10-CM | POA: Diagnosis not present

## 2015-11-01 NOTE — Progress Notes (Signed)
Internal Medicine Clinic Attending  Case discussed with Dr. Ahmed at the time of the visit.  We reviewed the resident's history and exam and pertinent patient test results.  I agree with the assessment, diagnosis, and plan of care documented in the resident's note. 

## 2015-11-03 DIAGNOSIS — E440 Moderate protein-calorie malnutrition: Secondary | ICD-10-CM | POA: Diagnosis not present

## 2015-11-03 DIAGNOSIS — I10 Essential (primary) hypertension: Secondary | ICD-10-CM | POA: Diagnosis not present

## 2015-11-03 DIAGNOSIS — K922 Gastrointestinal hemorrhage, unspecified: Secondary | ICD-10-CM | POA: Diagnosis not present

## 2015-11-03 DIAGNOSIS — E119 Type 2 diabetes mellitus without complications: Secondary | ICD-10-CM | POA: Diagnosis not present

## 2015-11-03 DIAGNOSIS — M150 Primary generalized (osteo)arthritis: Secondary | ICD-10-CM | POA: Diagnosis not present

## 2015-11-03 DIAGNOSIS — Z7984 Long term (current) use of oral hypoglycemic drugs: Secondary | ICD-10-CM | POA: Diagnosis not present

## 2015-11-10 ENCOUNTER — Other Ambulatory Visit: Payer: Self-pay | Admitting: Internal Medicine

## 2015-11-10 DIAGNOSIS — I10 Essential (primary) hypertension: Secondary | ICD-10-CM | POA: Diagnosis not present

## 2015-11-10 DIAGNOSIS — E119 Type 2 diabetes mellitus without complications: Secondary | ICD-10-CM | POA: Diagnosis not present

## 2015-11-10 DIAGNOSIS — E440 Moderate protein-calorie malnutrition: Secondary | ICD-10-CM | POA: Diagnosis not present

## 2015-11-10 DIAGNOSIS — K922 Gastrointestinal hemorrhage, unspecified: Secondary | ICD-10-CM | POA: Diagnosis not present

## 2015-11-10 DIAGNOSIS — M150 Primary generalized (osteo)arthritis: Secondary | ICD-10-CM | POA: Diagnosis not present

## 2015-11-10 DIAGNOSIS — Z7984 Long term (current) use of oral hypoglycemic drugs: Secondary | ICD-10-CM | POA: Diagnosis not present

## 2015-11-17 DIAGNOSIS — K922 Gastrointestinal hemorrhage, unspecified: Secondary | ICD-10-CM | POA: Diagnosis not present

## 2015-11-17 DIAGNOSIS — I10 Essential (primary) hypertension: Secondary | ICD-10-CM | POA: Diagnosis not present

## 2015-11-17 DIAGNOSIS — Z7984 Long term (current) use of oral hypoglycemic drugs: Secondary | ICD-10-CM | POA: Diagnosis not present

## 2015-11-17 DIAGNOSIS — M150 Primary generalized (osteo)arthritis: Secondary | ICD-10-CM | POA: Diagnosis not present

## 2015-11-17 DIAGNOSIS — E119 Type 2 diabetes mellitus without complications: Secondary | ICD-10-CM | POA: Diagnosis not present

## 2015-11-17 DIAGNOSIS — E440 Moderate protein-calorie malnutrition: Secondary | ICD-10-CM | POA: Diagnosis not present

## 2015-11-18 ENCOUNTER — Telehealth: Payer: Self-pay | Admitting: Licensed Clinical Social Worker

## 2015-11-18 NOTE — Telephone Encounter (Signed)
CSW placed called to pt.  CSW left message requesting return call. CSW provided contact hours and phone number. 

## 2015-11-24 DIAGNOSIS — K922 Gastrointestinal hemorrhage, unspecified: Secondary | ICD-10-CM | POA: Diagnosis not present

## 2015-11-24 DIAGNOSIS — E119 Type 2 diabetes mellitus without complications: Secondary | ICD-10-CM | POA: Diagnosis not present

## 2015-11-24 DIAGNOSIS — M150 Primary generalized (osteo)arthritis: Secondary | ICD-10-CM | POA: Diagnosis not present

## 2015-11-24 DIAGNOSIS — Z7984 Long term (current) use of oral hypoglycemic drugs: Secondary | ICD-10-CM | POA: Diagnosis not present

## 2015-11-24 DIAGNOSIS — E440 Moderate protein-calorie malnutrition: Secondary | ICD-10-CM | POA: Diagnosis not present

## 2015-11-24 DIAGNOSIS — I10 Essential (primary) hypertension: Secondary | ICD-10-CM | POA: Diagnosis not present

## 2015-11-30 ENCOUNTER — Encounter: Payer: Self-pay | Admitting: Licensed Clinical Social Worker

## 2015-11-30 NOTE — Telephone Encounter (Signed)
CSW placed follow up call to [name] to inquire if pt was still interested in moving.  [name] states she is currently living with her daughther and would like a place of her own.  Pt states other family member have offered for her to move in with them; however, [name] would like her own place.  CSW inquired if daughter was providing assistance with ADL's, pt declines.  [name] states daughter is aware she wants her own place but has not made efforts.  CSW discuss [facility], pt agreeable for CSW to [employer].

## 2015-12-01 DIAGNOSIS — E119 Type 2 diabetes mellitus without complications: Secondary | ICD-10-CM | POA: Diagnosis not present

## 2015-12-01 DIAGNOSIS — E440 Moderate protein-calorie malnutrition: Secondary | ICD-10-CM | POA: Diagnosis not present

## 2015-12-01 DIAGNOSIS — Z7984 Long term (current) use of oral hypoglycemic drugs: Secondary | ICD-10-CM | POA: Diagnosis not present

## 2015-12-01 DIAGNOSIS — I10 Essential (primary) hypertension: Secondary | ICD-10-CM | POA: Diagnosis not present

## 2015-12-01 DIAGNOSIS — M150 Primary generalized (osteo)arthritis: Secondary | ICD-10-CM | POA: Diagnosis not present

## 2015-12-01 DIAGNOSIS — K922 Gastrointestinal hemorrhage, unspecified: Secondary | ICD-10-CM | POA: Diagnosis not present

## 2015-12-03 ENCOUNTER — Telehealth: Payer: Self-pay | Admitting: Licensed Clinical Social Worker

## 2015-12-03 NOTE — Telephone Encounter (Signed)
CSW returned voicemail to Ms. Donelan daugther, Harriett SineNancy.  Daughter states mother informed her this worker was sending information but daughter had questions and they have not received anything yet.  CSW informed daughter was mailed earlier this week and should be there today or early next week.  Per pt's request, listing includes Independent living options based on age and income.  Pt and daughter on speaker and deny additional social work needs at this time.

## 2016-06-26 DIAGNOSIS — I10 Essential (primary) hypertension: Secondary | ICD-10-CM | POA: Diagnosis not present

## 2016-06-26 DIAGNOSIS — M1990 Unspecified osteoarthritis, unspecified site: Secondary | ICD-10-CM | POA: Diagnosis not present

## 2016-06-26 DIAGNOSIS — G2581 Restless legs syndrome: Secondary | ICD-10-CM | POA: Diagnosis not present

## 2016-06-26 DIAGNOSIS — E119 Type 2 diabetes mellitus without complications: Secondary | ICD-10-CM | POA: Diagnosis not present

## 2016-11-20 NOTE — Progress Notes (Signed)
   CC: follow-up of type 2 diabetes, hypertension, iron deficiency anemia  HPI:  Sharon Conway is a 83 y.o. F with medical history as outlined below who presents for follow-up of her chronic medical conditions.   Type 2 Diabetes: Most recent HbA1c 6.3%. She is on metformin 1000 mg BID and gi upset with this, namely nausea. Hemoglobin A1c today 8% she is interested in stopping metformin.  HTN: Reviewed medication bottles and patient is on amlodipine 5 mg daily prescribed by a prior cardiologist approximately one year ago.  Hx of GI bleed x2 in 2017, iron deficiency anemia: Denies any further hematochezia or melena. Has been compliant with her iron supplements daily.   Past Medical History:  Diagnosis Date  . Diabetes mellitus without complication (HCC)    type 2  . GI bleed 09/2015  . Hypertension    Review of Systems:   General: Denies fevers, chills, weight loss, fatigue HEENT: Denies changes in vision, sore throat, dysphagia Cardiac: Denies CP, SOB, palpitations Pulmonary: Denies cough, wheezes, PND Abd: Denies diarrhea, constipation, changes in bowels Extremities: Denies weakness or swelling  Physical Exam: General: Alert, in no acute distress. Pleasant and conversant HEENT: No icterus, injection or ptosis. No hoarseness or dysarthria  Cardiac: RRR, no MGR appreciated Pulmonary: CTA BL with normal WOB on RA. Able to speak in complete sentences Abd: Soft, non-tended. +bs Extremities: Warm, perfused. No significant pedal edema.  Vitals:   11/21/16 1537  BP: 126/62  Pulse: 90  Temp: 98.6 F (37 C)  TempSrc: Oral  SpO2: 97%  Weight: 120 lb (54.4 kg)  Height: 5\' 3"  (1.6 m)    Assessment & Plan:   See Encounters Tab for problem based charting.  Patient discussed with Dr. Cleda DaubE. Hoffman

## 2016-11-21 ENCOUNTER — Encounter: Payer: Self-pay | Admitting: Internal Medicine

## 2016-11-21 ENCOUNTER — Ambulatory Visit (INDEPENDENT_AMBULATORY_CARE_PROVIDER_SITE_OTHER): Payer: Medicare Other | Admitting: Internal Medicine

## 2016-11-21 VITALS — BP 126/62 | HR 90 | Temp 98.6°F | Ht 63.0 in | Wt 120.0 lb

## 2016-11-21 DIAGNOSIS — Z79899 Other long term (current) drug therapy: Secondary | ICD-10-CM

## 2016-11-21 DIAGNOSIS — Z7984 Long term (current) use of oral hypoglycemic drugs: Secondary | ICD-10-CM

## 2016-11-21 DIAGNOSIS — D509 Iron deficiency anemia, unspecified: Secondary | ICD-10-CM

## 2016-11-21 DIAGNOSIS — Z8719 Personal history of other diseases of the digestive system: Secondary | ICD-10-CM | POA: Diagnosis not present

## 2016-11-21 DIAGNOSIS — D649 Anemia, unspecified: Secondary | ICD-10-CM

## 2016-11-21 DIAGNOSIS — I10 Essential (primary) hypertension: Secondary | ICD-10-CM

## 2016-11-21 DIAGNOSIS — B351 Tinea unguium: Secondary | ICD-10-CM

## 2016-11-21 DIAGNOSIS — E119 Type 2 diabetes mellitus without complications: Secondary | ICD-10-CM | POA: Diagnosis present

## 2016-11-21 DIAGNOSIS — N3281 Overactive bladder: Secondary | ICD-10-CM

## 2016-11-21 LAB — GLUCOSE, CAPILLARY: Glucose-Capillary: 100 mg/dL — ABNORMAL HIGH (ref 65–99)

## 2016-11-21 LAB — POCT GLYCOSYLATED HEMOGLOBIN (HGB A1C): HEMOGLOBIN A1C: 8

## 2016-11-21 MED ORDER — AMLODIPINE BESYLATE 5 MG PO TABS: 5.0000 mg | ORAL_TABLET | Freq: Every day | ORAL | 11 refills | Status: DC

## 2016-11-21 MED ORDER — POLYSACCHARIDE IRON COMPLEX 150 MG PO CAPS: 150.0000 mg | ORAL_CAPSULE | Freq: Every day | ORAL | 2 refills | Status: DC

## 2016-11-21 MED ORDER — SOLIFENACIN SUCCINATE 5 MG PO TABS: 5.0000 mg | ORAL_TABLET | Freq: Every day | ORAL | 11 refills | Status: DC

## 2016-11-21 NOTE — Assessment & Plan Note (Addendum)
Reviewed patient's medication bottles today. She is on amlodipine 5 mg prescribed by her prior cardiologist who she last saw nearly 1 year ago. She is no longer following with cardiology given her advanced age and wishes to have her refill this medication. -Blood pressure today is excellent at 126/62 -Amlodipine 5 mg added to chart and prescribed

## 2016-11-21 NOTE — Assessment & Plan Note (Signed)
Patient had been taking metformin 1000 mg twice daily at home however was supposed to be prescribed 500 mg twice daily per last PCP note. She notes nausea when she takes metformin at home and request to be discontinued from this medication. Hemoglobin A1c today is 8%. We discussed decreasing her dose to 500 mg twice a day and discontinuing if she still has GI upset. -Metformin 500 mg twice daily, if patient able to tolerate. DC if not -Hemoglobin A1c goal less than 8.5%

## 2016-11-21 NOTE — Patient Instructions (Addendum)
It was an absolute pleasure meeting you and your daughter, Sharon Conway today! I'm glad you are doing well!  Today we talked about your blood sugar. Your hemoglobin A1c was 8% which is at goal. You are having some stomach upset from Metformin which is not uncommon. Please try the solutions that your daughter has before stopping metformin all together.   Today we also talked about your feet. I have referred you to podiatry.  Please come back in 1 year or sooner as needed!

## 2016-11-21 NOTE — Assessment & Plan Note (Signed)
Reports compliance with her iron supplements daily. No more episodes of bright red blood per rectum or melena. She denies any abdominal pain or changes in bowel movements. -Continue iron supplementation -CBC at next visit

## 2016-11-24 ENCOUNTER — Telehealth: Payer: Self-pay | Admitting: *Deleted

## 2016-11-24 NOTE — Telephone Encounter (Signed)
Call from patients daughter stating amlodipine and iron polysaccharides prescriptions were not at the pharmacy.  [facility] and those 2 rxs were "printed", but family was not aware of getting rx during visit.  Rx phoned into the pharmacy.Sharon Conway, Sharon Demello Cassady7/6/201812:24 PM

## 2016-11-26 NOTE — Progress Notes (Signed)
Internal Medicine Clinic Attending  Case discussed with Dr. Molt at the time of the visit.  We reviewed the resident's history and exam and pertinent patient test results.  I agree with the assessment, diagnosis, and plan of care documented in the resident's note. 

## 2016-12-19 ENCOUNTER — Encounter: Payer: Self-pay | Admitting: Internal Medicine

## 2016-12-20 DIAGNOSIS — E860 Dehydration: Secondary | ICD-10-CM | POA: Diagnosis not present

## 2016-12-20 DIAGNOSIS — K5731 Diverticulosis of large intestine without perforation or abscess with bleeding: Principal | ICD-10-CM | POA: Diagnosis present

## 2016-12-20 DIAGNOSIS — E119 Type 2 diabetes mellitus without complications: Secondary | ICD-10-CM | POA: Diagnosis present

## 2016-12-20 DIAGNOSIS — E875 Hyperkalemia: Secondary | ICD-10-CM | POA: Diagnosis not present

## 2016-12-20 DIAGNOSIS — Z7984 Long term (current) use of oral hypoglycemic drugs: Secondary | ICD-10-CM

## 2016-12-20 DIAGNOSIS — K625 Hemorrhage of anus and rectum: Secondary | ICD-10-CM | POA: Diagnosis not present

## 2016-12-20 DIAGNOSIS — N179 Acute kidney failure, unspecified: Secondary | ICD-10-CM | POA: Diagnosis present

## 2016-12-20 DIAGNOSIS — I10 Essential (primary) hypertension: Secondary | ICD-10-CM | POA: Diagnosis present

## 2016-12-21 ENCOUNTER — Inpatient Hospital Stay (HOSPITAL_COMMUNITY)
Admission: EM | Admit: 2016-12-21 | Discharge: 2016-12-22 | DRG: 378 | Disposition: A | Discharge status: Home / Self Care | Payer: Medicare Other | Attending: Oncology | Admitting: Oncology

## 2016-12-21 ENCOUNTER — Encounter (HOSPITAL_COMMUNITY): Payer: Self-pay | Admitting: Emergency Medicine

## 2016-12-21 DIAGNOSIS — E875 Hyperkalemia: Secondary | ICD-10-CM

## 2016-12-21 DIAGNOSIS — Z7984 Long term (current) use of oral hypoglycemic drugs: Secondary | ICD-10-CM | POA: Diagnosis not present

## 2016-12-21 DIAGNOSIS — K625 Hemorrhage of anus and rectum: Secondary | ICD-10-CM | POA: Diagnosis not present

## 2016-12-21 DIAGNOSIS — E860 Dehydration: Secondary | ICD-10-CM | POA: Diagnosis present

## 2016-12-21 DIAGNOSIS — N179 Acute kidney failure, unspecified: Secondary | ICD-10-CM | POA: Diagnosis present

## 2016-12-21 DIAGNOSIS — K5731 Diverticulosis of large intestine without perforation or abscess with bleeding: Secondary | ICD-10-CM

## 2016-12-21 DIAGNOSIS — K922 Gastrointestinal hemorrhage, unspecified: Secondary | ICD-10-CM | POA: Diagnosis present

## 2016-12-21 DIAGNOSIS — I10 Essential (primary) hypertension: Secondary | ICD-10-CM | POA: Diagnosis present

## 2016-12-21 DIAGNOSIS — E119 Type 2 diabetes mellitus without complications: Secondary | ICD-10-CM | POA: Diagnosis present

## 2016-12-21 LAB — COMPREHENSIVE METABOLIC PANEL
ALBUMIN: 3.9 g/dL (ref 3.5–5.0)
ALT: 12 U/L — ABNORMAL LOW (ref 14–54)
ANION GAP: 9 (ref 5–15)
AST: 22 U/L (ref 15–41)
Alkaline Phosphatase: 49 U/L (ref 38–126)
BUN: 28 mg/dL — ABNORMAL HIGH (ref 6–20)
CHLORIDE: 101 mmol/L (ref 101–111)
CO2: 25 mmol/L (ref 22–32)
Calcium: 9 mg/dL (ref 8.9–10.3)
Creatinine, Ser: 1.4 mg/dL — ABNORMAL HIGH (ref 0.44–1.00)
GFR calc non Af Amer: 31 mL/min — ABNORMAL LOW (ref >60)
GFR, EST AFRICAN AMERICAN: 36 mL/min — AB (ref >60)
GLUCOSE: 134 mg/dL — AB (ref 65–99)
POTASSIUM: 5.5 mmol/L — AB (ref 3.5–5.1)
SODIUM: 135 mmol/L (ref 135–145)
Total Bilirubin: 0.7 mg/dL (ref 0.3–1.2)
Total Protein: 7.2 g/dL (ref 6.5–8.1)

## 2016-12-21 LAB — CBC
HCT: 30 % — ABNORMAL LOW (ref 36.0–46.0)
HEMATOCRIT: 31.1 % — AB (ref 36.0–46.0)
HEMATOCRIT: 33.6 % — AB (ref 36.0–46.0)
HEMOGLOBIN: 10.6 g/dL — AB (ref 12.0–15.0)
HEMOGLOBIN: 9.6 g/dL — AB (ref 12.0–15.0)
Hemoglobin: 9.3 g/dL — ABNORMAL LOW (ref 12.0–15.0)
MCH: 26.5 pg (ref 26.0–34.0)
MCH: 26.9 pg (ref 26.0–34.0)
MCH: 27.2 pg (ref 26.0–34.0)
MCHC: 30.9 g/dL (ref 30.0–36.0)
MCHC: 31 g/dL (ref 30.0–36.0)
MCHC: 31.5 g/dL (ref 30.0–36.0)
MCV: 85.5 fL (ref 78.0–100.0)
MCV: 86.4 fL (ref 78.0–100.0)
MCV: 87.1 fL (ref 78.0–100.0)
Platelets: 222 10*3/uL (ref 150–400)
Platelets: 239 10*3/uL (ref 150–400)
Platelets: 275 10*3/uL (ref 150–400)
RBC: 3.51 MIL/uL — ABNORMAL LOW (ref 3.87–5.11)
RBC: 3.57 MIL/uL — ABNORMAL LOW (ref 3.87–5.11)
RBC: 3.89 MIL/uL (ref 3.87–5.11)
RDW: 12.7 % (ref 11.5–15.5)
RDW: 13.2 % (ref 11.5–15.5)
RDW: 13.2 % (ref 11.5–15.5)
WBC: 4.1 10*3/uL (ref 4.0–10.5)
WBC: 5.6 10*3/uL (ref 4.0–10.5)
WBC: 7.4 10*3/uL (ref 4.0–10.5)

## 2016-12-21 LAB — TYPE AND SCREEN
ABO/RH(D): O POS
Antibody Screen: NEGATIVE

## 2016-12-21 LAB — BASIC METABOLIC PANEL
Anion gap: 8 (ref 5–15)
BUN: 29 mg/dL — ABNORMAL HIGH (ref 6–20)
CHLORIDE: 102 mmol/L (ref 101–111)
CO2: 25 mmol/L (ref 22–32)
CREATININE: 1.14 mg/dL — AB (ref 0.44–1.00)
Calcium: 8.7 mg/dL — ABNORMAL LOW (ref 8.9–10.3)
GFR calc non Af Amer: 39 mL/min — ABNORMAL LOW (ref >60)
GFR, EST AFRICAN AMERICAN: 46 mL/min — AB (ref >60)
Glucose, Bld: 97 mg/dL (ref 65–99)
Potassium: 4.5 mmol/L (ref 3.5–5.1)
SODIUM: 135 mmol/L (ref 135–145)

## 2016-12-21 LAB — GLUCOSE, CAPILLARY
GLUCOSE-CAPILLARY: 106 mg/dL — AB (ref 65–99)
GLUCOSE-CAPILLARY: 98 mg/dL (ref 65–99)
Glucose-Capillary: 89 mg/dL (ref 65–99)
Glucose-Capillary: 97 mg/dL (ref 65–99)

## 2016-12-21 LAB — PROTIME-INR
INR: 1.06
PROTHROMBIN TIME: 13.8 s (ref 11.4–15.2)

## 2016-12-21 LAB — POC OCCULT BLOOD, ED: Fecal Occult Bld: POSITIVE — AB

## 2016-12-21 MED ORDER — INSULIN ASPART 100 UNIT/ML ~~LOC~~ SOLN
0.0000 [IU] | Freq: Three times a day (TID) | SUBCUTANEOUS | Status: DC
  Administered 2016-12-22: 3 [IU] via SUBCUTANEOUS

## 2016-12-21 MED ORDER — DARIFENACIN HYDROBROMIDE ER 7.5 MG PO TB24
7.5000 mg | ORAL_TABLET | Freq: Every day | ORAL | Status: DC
  Administered 2016-12-21 – 2016-12-22 (×2): 7.5 mg via ORAL
  Filled 2016-12-21 (×2): qty 1

## 2016-12-21 MED ORDER — ROPINIROLE HCL 1 MG PO TABS
2.0000 mg | ORAL_TABLET | Freq: Every day | ORAL | Status: DC
  Administered 2016-12-21: 2 mg via ORAL
  Filled 2016-12-21: qty 2

## 2016-12-21 MED ORDER — BOOST / RESOURCE BREEZE PO LIQD
1.0000 | Freq: Three times a day (TID) | ORAL | Status: DC
  Administered 2016-12-21 – 2016-12-22 (×3): 1 via ORAL

## 2016-12-21 MED ORDER — SODIUM CHLORIDE 0.9 % IV SOLN
INTRAVENOUS | Status: DC
  Administered 2016-12-21 – 2016-12-22 (×2): via INTRAVENOUS

## 2016-12-21 MED ORDER — SODIUM CHLORIDE 0.9 % IV BOLUS (SEPSIS)
500.0000 mL | Freq: Once | INTRAVENOUS | Status: AC
  Administered 2016-12-21: 500 mL via INTRAVENOUS

## 2016-12-21 NOTE — ED Notes (Signed)
Patient presents to ED for two episodes today of chest pain with right arm numbness and neck pain.  Pt states episodes resolved without any intervention after approx 20 minutes.  Pt denies CP or SOB at this time.  Denies any other associated symptoms.  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

## 2016-12-21 NOTE — Progress Notes (Signed)
Initial Nutrition Assessment  DOCUMENTATION CODES:   Not applicable  INTERVENTION:   -Boost Breeze po TID, each supplement provides 250 kcal and 9 grams of protein -RD will follow for diet advancement and supplement as appropriate  NUTRITION DIAGNOSIS:   Inadequate oral intake related to altered GI function as evidenced by  (NPO/clear liquids).  GOAL:   Patient will meet greater than or equal to 90% of their needs  MONITOR:   PO intake, Supplement acceptance, Diet advancement, Labs, Weight trends, Skin, I & O's  REASON FOR ASSESSMENT:   Malnutrition Screening Tool    ASSESSMENT:   83 yo female PMH of diverticulosis w/bleed May 2017, HTN, T2DM well controlled on metformin presents with painless rectal bleeding beginning this evening.   Pt admitted with lower GIB.   Spoke with pt, who reports fair appetite. She reports she generally grazes throughout the day. Commonly eaten foods are beans and ham. She shares that she lives with her daughter that cooks for her. She denies any changes in her eating habits. She consumes approximately 1 Ensure supplement daily.   Pt endorses UBW of around 134#. She reports slow, gradual weight loss over the past 3 years ("I don't know why, the weight keeps coming off"). However, per wt hx, wt has been stable over the past year.   Nutrition-Focused physical exam completed. Findings are mild fat depletion, mild muscle depletion, and no edema. Pt reports active lifestyle, walking and playing with her small dog. Mild fat and muscle depletion likely consistent with advanced age.   Discussed importance of good meal and supplement intake to promote healing. Pt amenable to Boost Breeze while receiving clear liquids.   Labs reviewed.   Diet Order:  Diet clear liquid Room service appropriate? Yes; Fluid consistency: Thin  Skin:  Reviewed, no issues  Last BM:  12/21/16  Height:   Ht Readings from Last 1 Encounters:  12/21/16 5\' 3"  (1.6 m)     Weight:   Wt Readings from Last 1 Encounters:  12/21/16 120 lb (54.4 kg)    Ideal Body Weight:  52.3 kg  BMI:  Body mass index is 21.26 kg/m.  Estimated Nutritional Needs:   Kcal:  1350-1550  Protein:  55-70 grams  Fluid:  > 1.3 L  EDUCATION NEEDS:   Education needs addressed  Ferdinand Revoir A. Mayford KnifeWilliams, RD, LDN, CDE Pager: [phone] After hours Pager: [phone]

## 2016-12-21 NOTE — Progress Notes (Signed)
   Subjective: No acute events overnight after admission. She reports overall feeling well, slightly decreased appetite. She has had no further episodes of rectal bleeding, denies abdominal pain, vomiting. She has not yet had a bowel movement since being admitted.    Objective:  Vital signs in last 24 hours: Vitals:   12/21/16 0427 12/21/16 0428 12/21/16 0430 12/21/16 0532  BP:   (!) 128/55 117/61  Pulse:   95 94  Resp:   (!) 24 17  Temp:    98.7 F (37.1 C)  TempSrc:    Oral  SpO2:   100% 100%  Weight: 120 lb (54.4 kg) 120 lb (54.4 kg)    Height:  5\' 3"  (1.6 m)     Physical Exam  Constitutional:  Elderly female resting in bed in no acute distress  HENT:  Head: Normocephalic and atraumatic.  Eyes: Pupils are equal, round, and reactive to light.  Neck: Neck supple. No tracheal deviation present. No thyromegaly present.  Cardiovascular: Normal rate, regular rhythm and normal heart sounds.   Pulmonary/Chest: Effort normal and breath sounds normal.  Abdominal: Soft. Bowel sounds are normal. She exhibits no distension. There is no rebound and no guarding.  No tenderness to palpation of abdomen, remote suprapubic incisional scar   Skin: Skin is warm and dry. Capillary refill takes less than 2 seconds.     Assessment/Plan:  Active Problems:   Lower GI bleed   AKI (acute kidney injury) (HCC)   Hyperkalemia  Lower GI Bleed, recurrent  Pt presenting with episode of hematochezia, melanotic stools on ED exam. History of two hospitalizations for lower GI bleeding in May 2017: angiography identified bleeding at splenic flexure, subsequent colonoscopy showed significant diverticulosis without signs of bleeding. Bleeding spontaneously resolved at that time without further episodes until current presentation. GI made aware of admission and agree with current management, will formally consult if she deteriorates. She is likely not a candidate for interventions given age. Initial Hgb 10.6,  currently 9.3. Normal coagulation studies. Hemodynamically stable. --CBC q8 hrs, monitor vital signs and for bleeding --Type and cross up to date --Decrease IVF to 50 ml/hr  --Clear liquid diet, advance as tolerated   Acute Kidney Injury, pre-renal Baseline Cr around 0.9-1.0, presented with Cr 1.4 most likely due to low volume status in setting of bleeding episode, potential poor PO intake. She has received 500 ml bolus and initial IVF at 100/hr with improvement in Cr, currently 1.14.  --Decrease IVF to 50 ml/hr as above   Hypertension, chronic  Stable on home regimen of Amlodipine 5 mg.  --Hold home amlodipine  Diabetes Mellitus Type 2 Home regimen of Metformin, stable --Hold metformin, correctional insulin as needed      Dispo: Anticipated discharge in approximately 2 day(s).   Ginger CarneHarden, Christena Sunderlin, MD 12/21/2016, 6:54 AM Pager: [phone]

## 2016-12-21 NOTE — H&P (Signed)
Date: 12/21/2016               Patient Name:  Sharon Conway MRN: [id number]  DOB: 06-02-1918 Age / Sex: 83 y.o., female   PCP: Noemi ChapelMolt, Bethany, DO         Medical Service: Internal Medicine Teaching Service         Attending Physician: Dr. Levert FeinsteinGranfortuna, James M, MD    First Contact: Dr. Ginger CarneHarden, Kyler Pager: [phone]  Second Contact: Dr. Gwynn BurlyWallace, Andrew Pager: [phone]       After Hours (After 5p/  First Contact Pager: [phone]  weekends / holidays): Second Contact Pager: [phone]   Chief Complaint: Rectal bleeding  History of Present Illness: 83 yo female with pmh of Diverticulosis (admitted 1 yr ago for lower GI bleed), T2DM, HTN, presents with rectal bleeding that began this evening, first noticed by her daughter while she was on the porch playing with her dog.  She did mention she picked up a mop bucket earlier in the day that was heavier than usual and thought it may have something to do with that. The daughter noticed a few drops of bright red blood hit the porch from the patients adult diaper.  Pt denies having diarrhea associated with the bleeding, however, the history taken in the ED suggests there was associated diarrhea.  Pt denies any associated abdominal pain, chest pain, shortness of breath, dizziness, headache, vision changes, orthostasis.  Pt reports her last bowel movement was the day prior and it was brown, non bloody, semi solid, painless and one episode. Pt denies recent illness, has not taken any new medications or over the counter drugs or supplements.  Pt denies recent constipation and takes senokot daily.  Patient has not had aeny pisodes of rectal bleeding since her last admission.  Pt was admitted for similar bleeding on May 5th of last year, tagged red blood cell scan demonstrated bleeding lower GI bleeding originating from splenic flexure but embolization was not performed and surgery was felt to be too high risk.  Pt was transfused and sent home, bleeding recurred and she  returned on the 10th, GI re-consulted, recommended no interventions, pt was transfused again and bleeding stopped spontaneously and patient was discharged again.    ED course: mild tachycardia on initial presentation, otherwise vitals stable, orthostatic vital signs negative for orthostasis, pt received 500cc NS, CBC and CMP shown below, type and screen ordered, no episodes of bleeding in ED, pt remained asymptomatic.    Meds:  Current Meds  Medication Sig  . amLODipine (NORVASC) 5 MG tablet Take 1 tablet (5 mg total) by mouth daily.  . feeding supplement, ENSURE ENLIVE, (ENSURE ENLIVE) LIQD Take 237 mLs by mouth 2 (two) times daily between meals.  . iron polysaccharides (NIFEREX) 150 MG capsule Take 1 capsule (150 mg total) by mouth daily.  . metFORMIN (GLUCOPHAGE) 500 MG tablet Take 500 mg by mouth 2 (two) times daily.  Marland Kitchen. rOPINIRole (REQUIP) 2 MG tablet Take 2 mg by mouth at bedtime.  . senna-docusate (SENOKOT-S) 8.6-50 MG tablet Take 1 tablet by mouth 2 (two) times daily. (Patient taking differently: Take 1 tablet by mouth 2 (two) times daily as needed for mild constipation. )  . solifenacin (VESICARE) 5 MG tablet Take 1 tablet (5 mg total) by mouth daily.   Labs: CBC    Component Value Date/Time   WBC 7.4 12/21/2016 0008   RBC 3.89 12/21/2016 0008   HGB 10.6 (L) 12/21/2016 0008  HCT 33.6 (L) 12/21/2016 0008   HCT 29.2 (L) 10/28/2015 1630   PLT 275 12/21/2016 0008   MCV 86.4 12/21/2016 0008   MCH 27.2 12/21/2016 0008   MCHC 31.5 12/21/2016 0008   RDW 13.2 12/21/2016 0008   LYMPHSABS 1.6 09/29/2015 2024   MONOABS 0.7 09/29/2015 2024   EOSABS 0.2 09/29/2015 2024   BASOSABS 0.0 09/29/2015 2024   BMET    Component Value Date/Time   NA 135 12/21/2016 0008   K 5.5 (H) 12/21/2016 0008   CL 101 12/21/2016 0008   CO2 25 12/21/2016 0008   GLUCOSE 134 (H) 12/21/2016 0008   BUN 28 (H) 12/21/2016 0008   CREATININE 1.40 (H) 12/21/2016 0008   CALCIUM 9.0 12/21/2016 0008   GFRNONAA  31 (L) 12/21/2016 0008   GFRAA 36 (L) 12/21/2016 0008    Allergies: Allergies as of 12/20/2016  . (No Known Allergies)   Past Medical History:  Diagnosis Date  . Diabetes mellitus without complication (HCC)    type 2  . GI bleed 09/2015  . Hypertension     Family History:  History reviewed. No pertinent family history. Social History:  Social History   Social History  . Marital status: Single    Spouse name: N/A  . Number of children: N/A  . Years of education: N/A   Occupational History  . Not on file.   Social History Main Topics  . Smoking status: Never Smoker  . Smokeless tobacco: Never Used  . Alcohol use No  . Drug use: No  . Sexual activity: Not on file   Other Topics Concern  . Not on file   Social History Narrative  . No narrative on file    Review of Systems: Review of Systems  Constitutional: Positive for weight loss (pt endorses feeling like she has been losing weigh since before her first bleed despite a healthy appetite). Negative for chills, diaphoresis and fever.  HENT: Negative for ear discharge, ear pain, hearing loss and tinnitus.   Eyes: Negative for blurred vision, double vision and photophobia.  Respiratory: Negative for cough, hemoptysis, sputum production and shortness of breath.   Cardiovascular: Negative for chest pain, palpitations, orthopnea, leg swelling and PND.  Gastrointestinal: Positive for blood in stool. Negative for abdominal pain, constipation, diarrhea, heartburn, nausea and vomiting.  Genitourinary: Negative for dysuria, frequency and urgency.  Musculoskeletal: Negative for joint pain and myalgias.  Skin: Negative for itching and rash.  Neurological: Negative for dizziness, sensory change, speech change, focal weakness, seizures, loss of consciousness, weakness and headaches.  Psychiatric/Behavioral: Negative for depression, substance abuse and suicidal ideas.    Physical Exam: Blood pressure 117/61, pulse 94,  temperature 98.7 F (37.1 C), temperature source Oral, resp. rate 17, height 5\' 3"  (1.6 m), weight 120 lb (54.4 kg), SpO2 100 %. Physical Exam  Constitutional: She is oriented to person, place, and time. She appears well-developed and well-nourished.  HENT:  Head: Normocephalic and atraumatic.  Eyes: Pupils are equal, round, and reactive to light. EOM are normal.  Neck: No JVD present. No thyromegaly present.  Cardiovascular: Normal rate, regular rhythm and normal heart sounds.  Exam reveals no friction rub.   No murmur heard. Pulmonary/Chest: Effort normal and breath sounds normal. No respiratory distress. She has no wheezes.  Abdominal: Soft. She exhibits no distension. Bowel sounds are increased. There is no tenderness. There is no rebound and no guarding.  Genitourinary: Rectal exam shows guaiac positive stool (no active bleeding from rectum, diaper was  clean on my exam).  Musculoskeletal: She exhibits deformity. She exhibits no edema.  Neurological: She is alert and oriented to person, place, and time.  Skin: Skin is warm and dry.  Psychiatric: She has a normal mood and affect. Her behavior is normal.    EKG: ordered, not yet available  CXR: n/a  Assessment & Plan by Problem: Active Problems:   Lower GI bleed   AKI (acute kidney injury) [facility](HCC)   Hyperkalemia  83 yo female PMH of diverticulosis w/bleed May 2017, HTN, T2DM well controlled on metformin presents with painless rectal bleeding beginning this evening.    Lower GI bleed: painless, began this evening, not associated with diarrhea per pt, however per ED report pt had one episode of diarrhea at home, none in ED.  -repeat CBC, BMP this am -PT INR -monitor pt closely for signs of further bleeding  AKI: likely prerenal BUN/Cr 20, Hgb elevated above baseline despite bleed, likely dilutional, pt denies poor po intake  -1600mL/hr NS   Hyperkalemia: mild at 5.5 on initial ED labs Likely 2/2 aki poor K  excretion  -ECG -14300mL/hr NS  T2DM: pt well controlled on only metformin at home, glucose 132 in ED  -hold metformin as pt has AKI -Correctional insulin as needed   HTN: pt on amlodipine 5mg  at home  -hold amlodipine for now as blood pressure 117/61 and concern for volume loss  Dispo: Admit patient to Inpatient with expected length of stay greater than 2 midnights.  Signed: Angelita InglesWinfrey, Taygan Connell B, MD 12/21/2016, 6:01 AM  Thornell MuleBrandon Kenner Lewan MD PGY-1 Internal Medicine Pager # [phone]

## 2016-12-21 NOTE — ED Provider Notes (Signed)
[facility]-EMERGENCY DEPT Provider Note   CSN: [id number] Arrival date & time: 12/20/16  2357  By signing my name below, I, Deland PrettySherilynn Knight, attest that this documentation has been prepared under the direction and in the presence of Janijah Symons, MD. Electronically Signed: Deland PrettySherilynn Knight, ED Scribe. 12/21/16. 2:57 AM.  History   Chief Complaint Chief Complaint  Patient presents with  . Rectal Bleeding   The history is provided by the patient and a relative. No language interpreter was used.  Rectal Bleeding  Quality:  Bright red Amount:  Moderate Timing:  Constant Chronicity:  Recurrent Context: not anal fissures   Similar prior episodes: yes   Relieved by:  Nothing Worsened by:  Nothing Ineffective treatments:  None tried Associated symptoms: no abdominal pain, no fever and no vomiting   Risk factors: no hx of IBD     HPI Comments: Sharon Conway is a 83 y.o. female who presents to the Emergency Department complaining of rectal bleeding with associated diarrhea that began this evening. Per daughter, the pt noticed some blood dripping on the carpet. She denies injury. The daughter also states that that the people had similar symptoms a year ago that was coming from her colon. In addition to a h/x of GI bleed, she also has a h/x of HTN and DM. Per note, the pt denies chest pain, weakness, fatigue, and additional complaints at this time.   Past Medical History:  Diagnosis Date  . Diabetes mellitus without complication (HCC)    type 2  . GI bleed 09/2015  . Hypertension     Patient Active Problem List   Diagnosis Date Noted  . Hypertension 11/21/2016  . Chronic anemia 10/28/2015  . Malnutrition of moderate degree 10/01/2015  . Gastrointestinal hemorrhage associated with intestinal diverticulitis   . Diabetes mellitus type II, controlled (HCC) 09/22/2015    Past Surgical History:  Procedure Laterality Date  . ABDOMINAL HYSTERECTOMY    . COLONOSCOPY N/A 10/01/2015   Procedure:  COLONOSCOPY;  Surgeon: Carman ChingJames Edwards, MD;  [facility];  Service: Endoscopy;  Laterality: N/A;  contol of bleeding    OB History    No data available       Home Medications    Prior to Admission medications   Medication Sig Start Date End Date Taking? Authorizing Provider  amLODipine (NORVASC) 5 MG tablet Take 1 tablet (5 mg total) by mouth daily. 11/21/16 11/21/17  Molt, Bethany, DO  feeding supplement, ENSURE ENLIVE, (ENSURE ENLIVE) LIQD Take 237 mLs by mouth 2 (two) times daily between meals. 10/03/15   Hyacinth MeekerAhmed, Tasrif, MD  iron polysaccharides (NIFEREX) 150 MG capsule Take 1 capsule (150 mg total) by mouth daily. 11/21/16   Molt, Bethany, DO  metFORMIN (GLUCOPHAGE) 500 MG tablet Take 500 mg by mouth 2 (two) times daily.    [provider]  pantoprazole (PROTONIX) 40 MG tablet Take 1 tablet (40 mg total) by mouth daily. Patient not taking: Reported on 09/29/2015 09/29/15   Valentino NoseBoswell, Nathan, MD  rOPINIRole (REQUIP) 2 MG tablet Take 2 mg by mouth at bedtime.    [provider]  senna-docusate (SENOKOT-S) 8.6-50 MG tablet Take 1 tablet by mouth 2 (two) times daily. 10/03/15   Hyacinth MeekerAhmed, Tasrif, MD  solifenacin (VESICARE) 5 MG tablet Take 1 tablet (5 mg total) by mouth daily. 11/21/16   Molt, Bethany, DO    Family History History reviewed. No pertinent family history.  Social History Social History  Substance Use Topics  . Smoking status: Never Smoker  .  Smokeless tobacco: Never Used  . Alcohol use No     Allergies   Patient has no known allergies.   Review of Systems Review of Systems  Constitutional: Negative for diaphoresis, fatigue and fever.  Respiratory: Negative for shortness of breath.   Cardiovascular: Negative for chest pain.  Gastrointestinal: Positive for anal bleeding, diarrhea and hematochezia. Negative for abdominal pain, rectal pain and vomiting.  All other systems reviewed and are negative.    Physical Exam Updated Vital Signs BP 124/68    Pulse 88   Temp 99 F (37.2 C) (Oral)   Resp 18   SpO2 98%   Physical Exam  Constitutional: She is oriented to person, place, and time. She appears well-developed and well-nourished.  HENT:  Head: Normocephalic and atraumatic.  Mouth/Throat: Oropharynx is clear and moist.  Eyes: Pupils are equal, round, and reactive to light. Conjunctivae and EOM are normal.  Neck: Normal range of motion. No JVD present.  Cardiovascular: Normal rate, regular rhythm, normal heart sounds and intact distal pulses.  Exam reveals no gallop and no friction rub.   No murmur heard. Pulmonary/Chest: Effort normal and breath sounds normal. No respiratory distress. She has no wheezes. She has no rales.  Abdominal: Soft. Bowel sounds are normal. She exhibits no distension and no mass. There is rebound. There is no guarding.  Genitourinary: Rectal exam shows guaiac positive stool.  Musculoskeletal: Normal range of motion.  Neurological: She is alert and oriented to person, place, and time. She displays normal reflexes.  Skin: Skin is warm and dry. Capillary refill takes less than 2 seconds.  Psychiatric: She has a normal mood and affect.  Nursing note and vitals reviewed.    ED Treatments / Results   Vitals:   12/21/16 0159 12/21/16 0252  BP: 124/68 (!) 143/73  Pulse: 88 88  Resp: 18 (!) 22  Temp:      DIAGNOSTIC STUDIES: Oxygen Saturation is 98% on RA, normal by my interpretation.   COORDINATION OF CARE: 3:00 AM-Discussed next steps with pt. Pt verbalized understanding and is agreeable with the plan.   Labs (all labs ordered are listed, but only abnormal results are displayed)  Medications  sodium chloride 0.9 % bolus 500 mL (not administered)    Procedures Procedures (including critical care time)  Medications Ordered in ED  Medications  sodium chloride 0.9 % bolus 500 mL (not administered)      Final Clinical Impressions(s) / ED Diagnoses  LGIB: admit to internal medicine teaching  service.    I personally performed the services described in this documentation, which was scribed in my presence. The recorded information has been reviewed and is accurate.      Tirza Senteno, MD 12/21/16 [phone]

## 2016-12-21 NOTE — ED Triage Notes (Signed)
Pt presents to ED for assessment of bright red blood in the bedside commode after waking up from nap.  Pt states first episode this week, but had another episode 2+ weeks ago.  Pt denies any abdominal pain, chest pain, weakness, fatigue, feeling cold... Denies all.

## 2016-12-21 NOTE — Progress Notes (Signed)
Medicine attending: I examined this patient today together with resident physician Dr. Ginger CarneKyler Harden and I concur with his evaluation and management plan which we discussed together. Please see separate attending admission note for complete details. She remains hemodynamically stable with minimal fall in her hemoglobin consistent with delusional effect from rehydration.  She has not noted any gross hematochezia.  She has not had a bowel movement yet today.  In reviewing data from admission in May 2017, she had extensive diverticulosis throughout the entire colon.  It is likely that we are seeing a recurrent diverticular bleed.  Per phone discussion with gastroenterology, conservative management recommended which we will continue.

## 2016-12-21 NOTE — Progress Notes (Signed)
Pt arrived to [address]. Ambulated from stretcher to bed, alert and oriented to self, place and situation. VS stable, cardiac monitor placed on pt, portable notified of need for continuous oxygen monitor.  Pt oriented to room and equipment, advised about valuable policy. Pt instructed to call for assistance, instruction given how to use call bell, bed alarm activated. Will continue to monitor and treat pt per MD orders.

## 2016-12-22 ENCOUNTER — Telehealth: Payer: Self-pay

## 2016-12-22 DIAGNOSIS — N179 Acute kidney failure, unspecified: Secondary | ICD-10-CM

## 2016-12-22 LAB — CBC
HCT: 29.6 % — ABNORMAL LOW (ref 36.0–46.0)
Hemoglobin: 9.1 g/dL — ABNORMAL LOW (ref 12.0–15.0)
MCH: 26.7 pg (ref 26.0–34.0)
MCHC: 30.7 g/dL (ref 30.0–36.0)
MCV: 86.8 fL (ref 78.0–100.0)
PLATELETS: 220 10*3/uL (ref 150–400)
RBC: 3.41 MIL/uL — AB (ref 3.87–5.11)
RDW: 12.9 % (ref 11.5–15.5)
WBC: 3.5 10*3/uL — ABNORMAL LOW (ref 4.0–10.5)

## 2016-12-22 LAB — BASIC METABOLIC PANEL
Anion gap: 5 (ref 5–15)
BUN: 19 mg/dL (ref 6–20)
CHLORIDE: 106 mmol/L (ref 101–111)
CO2: 28 mmol/L (ref 22–32)
Calcium: 8.7 mg/dL — ABNORMAL LOW (ref 8.9–10.3)
Creatinine, Ser: 0.86 mg/dL (ref 0.44–1.00)
GFR calc Af Amer: >60 mL/min (ref >60)
GFR, EST NON AFRICAN AMERICAN: 55 mL/min — AB (ref >60)
GLUCOSE: 108 mg/dL — AB (ref 65–99)
POTASSIUM: 4.6 mmol/L (ref 3.5–5.1)
Sodium: 139 mmol/L (ref 135–145)

## 2016-12-22 LAB — GLUCOSE, CAPILLARY
Glucose-Capillary: 98 mg/dL (ref 65–99)
Glucose-Capillary: 206 mg/dL — ABNORMAL HIGH (ref 65–99)

## 2016-12-22 NOTE — Progress Notes (Signed)
   Subjective: No acute events overnight, notes difficulty falling asleep. Improved appetite compared to yesterday. No further bleeding or blood in BMs. Continues to deny abdominal pain, nausea, vomiting. No other complaints. Nurse notes malodorous urine this morning, pt denies any dysuria or other urinary changes.   Objective:  Vital signs in last 24 hours: Vitals:   12/21/16 1432 12/21/16 2053 12/21/16 2351 12/22/16 0351  BP: (!) 125/49 (!) 111/48 (!) 134/54 (!) 143/65  Pulse: 93 81 79 77  Resp: 18 17 18 17   Temp:  98.5 F (36.9 C) 98.3 F (36.8 C) 98.6 F (37 C)  TempSrc:  Oral Oral Oral  SpO2: 99% 100% 99% 100%  Weight:      Height:       Physical Exam  Constitutional:  Elderly female sleeping in bed, easily arousable, no acute distress  HENT:  Head: Normocephalic and atraumatic.  Eyes: Pupils are equal, round, and reactive to light.  Neck: Neck supple. No tracheal deviation present. No thyromegaly present.  Cardiovascular: Normal rate, regular rhythm, normal heart sounds and intact distal pulses.   Pulmonary/Chest: Effort normal and breath sounds normal.  Abdominal: Soft. Bowel sounds are normal. She exhibits no distension. There is no rebound and no guarding.  No tenderness to palpation of abdomen, remote suprapubic incisional scar   Skin: Skin is warm and dry. Capillary refill takes less than 2 seconds.     Assessment/Plan:  Active Problems:   Lower GI bleed   AKI (acute kidney injury) (HCC)   Hyperkalemia   Diverticulosis of colon with hemorrhage   Dehydration  Lower GI Bleed, recurrent  Pt presenting with episode of hematochezia, melanotic stools on ED exam. History of two hospitalizations for lower GI bleeding in May 2017: angiography identified bleeding at splenic flexure, subsequent colonoscopy showed significant diverticulosis without signs of bleeding. Bleeding spontaneously resolved at that time without further episodes until current presentation. GI made  aware of admission and agree with current management, will formally consult if she deteriorates. She is likely not a candidate for interventions given age. Initial Hgb 10.6 > 9.3 > 9.6, currently 9.1. Normal coagulation studies. Hemodynamically stable. --CBC q12 hrs, monitor vital signs and for bleeding --Type and cross up to date --Full liquid diet, advance as tolerated   Acute Kidney Injury, pre-renal, resolved Baseline Cr around 0.9-1.0, presented with Cr 1.4 most likely due to low volume status in setting of bleeding episode, potential poor PO intake. She received 500 ml bolus and initial IVF at 100/hr with improvement in Cr, currently 0.86 consistent with baseline.   Hypertension, chronic  Stable on home regimen of Amlodipine 5 mg.  --Hold home amlodipine  Diabetes Mellitus Type 2 Home regimen of Metformin, stable --Hold metformin, correctional insulin as needed      Dispo: Anticipated discharge today.   Ginger CarneHarden, Sherlie Boyum, MD 12/22/2016, 6:44 AM Pager: [phone]

## 2016-12-22 NOTE — Progress Notes (Signed)
Pt urine malodorous, MD notified

## 2016-12-22 NOTE — Telephone Encounter (Signed)
Hospital TOC per Dr Earlene PlaterWallace, discharge 12/22/2016, appt 12/28/2016 @ 2:15.

## 2016-12-22 NOTE — Discharge Summary (Signed)
Medicine attending discharge note: I personally examined this patient on the day of discharge together with resident physician Dr. Ginger CarneKyler Harden and I attest to the accuracy of the discharge evaluation and plan which will be documented in his discharge note. 83 year old woman admitted with recurrent lower GI bleed.  Known severe diverticulosis affecting the entire colon as documented on colonoscopy done in May 2017 when she presented with an initial lower GI bleed.  On initial exam, blood pressure decreased from her baseline but she was hemodynamically stable.  Initial hemoglobin 10.6 with most recent recorded value from 1 year ago of 9.4.  Post hydration hemoglobin 9.3 and remained stable over the next 48 hours and was 9.1 on day of planned admission August 3.  No gross bleeding occurred while she was under observation.  Blood pressure returned to her baseline and was 143/83 at discharge.  She was mildly dehydrated with BUN 28 and creatinine 1.4.  Following hydration, renal function normalized and BUN 19 creatinine 0.9 at discharge. Gastroenterology consulted by phone.  Given clinical stability and the nature of this problem in a very old person, they recommended continuing conservative therapy with observation alone.  Endoscopic intervention not indicated at this time.  Disposition: Condition stable at time of discharge She will follow-up in our General medical clinic There were no complications

## 2016-12-22 NOTE — Discharge Summary (Signed)
Name: Sharon Conway MRN: [id number] DOB: 02-13-1919 83 y.o. PCP: Noemi ChapelMolt, Bethany, DO  Date of Admission: 12/21/2016  2:29 AM Date of Discharge: 12/22/2016 Attending Physician: Levert FeinsteinGranfortuna, James M, MD  Discharge Diagnosis: Principal Problem:   Lower GI bleed Active Problems:   Diverticulosis of colon with hemorrhage   Discharge Medications: Allergies as of 12/22/2016   No Known Allergies     Medication List    TAKE these medications   amLODipine 5 MG tablet Commonly known as:  NORVASC Take 1 tablet (5 mg total) by mouth daily.   feeding supplement (ENSURE ENLIVE) Liqd Take 237 mLs by mouth 2 (two) times daily between meals.   iron polysaccharides 150 MG capsule Commonly known as:  NIFEREX Take 1 capsule (150 mg total) by mouth daily.   metFORMIN 500 MG tablet Commonly known as:  GLUCOPHAGE Take 500 mg by mouth 2 (two) times daily.   rOPINIRole 2 MG tablet Commonly known as:  REQUIP Take 2 mg by mouth at bedtime.   senna-docusate 8.6-50 MG tablet Commonly known as:  Senokot-S Take 1 tablet by mouth 2 (two) times daily. What changed:  when to take this  reasons to take this   solifenacin 5 MG tablet Commonly known as:  VESICARE Take 1 tablet (5 mg total) by mouth daily.       Disposition and follow-up:   Sharon Conway [facility] in St[facility].  At the hospital follow up visit please address:  1.  -Assess for continued resolution of lower GI bleed and stable Hgb.  2.  Labs / imaging needed at time of follow-up: Repeat CBC  3.  Pending labs/ test needing follow-up: None  Follow-up Appointments: Follow-up Information    Conway, Bethany, DO Follow up.   Specialty:  Internal Medicine Why:  Follow up with your PCP here in the Internal Medicine Center  Contact information: [address] [place] [address] [phone]           Hospital Course by problem list: Principal Problem:   Lower GI bleed Active  Problems:   Diverticulosis of colon with hemorrhage   1. Lower GI Bleed likely due to colonic diverticulosis, recurrent  Ms. Malizia presented to the ED following episode of painless hematochezia, initial exam revealed melanotic stools in rectal vault. She has a history of lower GI bleeding one year ago attributed to colonic diverticulosis after workup. She was hemodynamically stable on admission with initial Hgb of 10.6. She received gentle IV hydration and remained stable. GI was made aware of this admission and agreed with conservative management. Her diet was slowly advanced and she tolerated all PO intake. There were no further episodes of bleeding and her Hgb remained stable for over 24 hrs. On discharge Hgb was 9.1.   2. Acute Kidney Injury, pre-renal, resolved Her initial Cr on presentation was 1.4, increased from documented baseline of around 0.9-1. This was likely due to low volume status and subsequently improved with hydration, Cr 0.86 on discharge.   3. Hypertension, chronic Pt presented with stable blood pressure in normal range, home Amlodipine held in setting of bleeding. Her BP stabilized in 140s systolic and home regimen of Amlodipine 5 mg was resumed on discharge.   4. Diabetes Mellitus Type 2, chronic Metformin held during hospitalization and pt had low requirement for correctional insulin. Resumed metformin on discharge.   Discharge Vitals:   BP (!) 143/83 (BP Location: Right Arm)   Pulse 86  Temp 98.6 F (37 C) (Oral)   Resp 17   Ht 5\' 3"  (1.6 m)   Wt 120 lb (54.4 kg)   SpO2 99%   BMI 21.26 kg/m   Pertinent Labs, Studies, and Procedures:  CBC: WBC 3.5, Hgb 9.1, Plts 220 BMP: Na 139, K 4.6, Cl 106, CO2 28, BUN 19, Cr 0.86, Ca 8.7  PT 13.8, INR 1.06   Discharge Instructions: Discharge Instructions    Diet - low sodium heart healthy    Complete by:  As directed    Discharge instructions    Complete by:  As directed    -Follow up with Dr. Vincente LibertyMolt in 1-2 weeks to  make sure you're still doing well. The clinic should call you to set up an appointment.  -Be sure to come back to the hospital if you have any new bleeding from your rectum.   Increase activity slowly    Complete by:  As directed       Signed: Ginger CarneHarden, Teighlor Korson, MD 12/22/2016, 12:11 PM   Pager: [phone]

## 2016-12-28 ENCOUNTER — Encounter: Payer: Self-pay | Admitting: Internal Medicine

## 2016-12-28 ENCOUNTER — Ambulatory Visit (INDEPENDENT_AMBULATORY_CARE_PROVIDER_SITE_OTHER): Payer: Medicare Other | Admitting: Internal Medicine

## 2016-12-28 VITALS — BP 98/67 | HR 94 | Temp 98.4°F | Wt 114.2 lb

## 2016-12-28 DIAGNOSIS — I10 Essential (primary) hypertension: Secondary | ICD-10-CM | POA: Diagnosis not present

## 2016-12-28 DIAGNOSIS — K922 Gastrointestinal hemorrhage, unspecified: Secondary | ICD-10-CM

## 2016-12-28 DIAGNOSIS — Z7984 Long term (current) use of oral hypoglycemic drugs: Secondary | ICD-10-CM

## 2016-12-28 DIAGNOSIS — E119 Type 2 diabetes mellitus without complications: Secondary | ICD-10-CM | POA: Diagnosis not present

## 2016-12-28 LAB — CBC
HEMATOCRIT: 31 % — AB (ref 36.0–46.0)
Hemoglobin: 9.6 g/dL — ABNORMAL LOW (ref 12.0–15.0)
MCH: 27 pg (ref 26.0–34.0)
MCHC: 31 g/dL (ref 30.0–36.0)
MCV: 87.3 fL (ref 78.0–100.0)
PLATELETS: 303 10*3/uL (ref 150–400)
RBC: 3.55 MIL/uL — ABNORMAL LOW (ref 3.87–5.11)
RDW: 13.2 % (ref 11.5–15.5)
WBC: 5.7 10*3/uL (ref 4.0–10.5)

## 2016-12-28 LAB — FERRITIN: Ferritin: 18 ng/mL (ref 11–307)

## 2016-12-28 MED ORDER — METFORMIN HCL ER 500 MG PO TB24: 500.0000 mg | ORAL_TABLET | Freq: Every day | ORAL | 5 refills | Status: DC

## 2016-12-28 NOTE — Patient Instructions (Addendum)
It was a pleasure to meet you today, [name].   We obtained blood work today to check your blood levels and your iron levels. We will call you if the results are abnormal.   We also made changes to your medications. You will STOP taking metformin 2 times day. You will not START taking a different form of metformin 1 tablet only once a day. Make sure to take this medication with food.   Your blood pressure was normal today. STOP taking amlodipine.   We also talked about the [facility] program during your visit. We strongly recommend you think about talking to them as you will benefit from the many services they have to offer.   Please call the clinic if new symptoms develops or you have any questions about the changes in medications.

## 2016-12-29 NOTE — Progress Notes (Signed)
   CC: Hospital follow up for lower GI bleed   HPI:  [name] is a 83 y.o. female with PMH as described below who presents to clinic for hospital follow up after a LGIB 2/2 diverticulosis. Please refer to problem oriented assessment and plan for further details.   Past Medical History:  Diagnosis Date  . Diabetes mellitus without complication (HCC)    type 2  . GI bleed 09/2015  . Hypertension    Review of Systems:   Review of Systems  Constitutional: Negative for chills, fever and weight loss.  Respiratory: Negative for shortness of breath.   Cardiovascular: Negative for chest pain and palpitations.  Gastrointestinal: Positive for melena. Negative for abdominal pain, blood in stool, constipation, diarrhea, nausea and vomiting.  Neurological: Negative for dizziness and headaches.    Physical Exam:  Vitals:   12/28/16 1552  BP: 98/67  Pulse: 94  Temp: 98.4 F (36.9 C)  TempSrc: Oral  SpO2: 99%  Weight: 114 lb 3.2 oz (51.8 kg)   General: pleasant female, thin, sitting up in char in no acute distress  HENT: NCAT, neck supple and FROM, OP clear without exudates or erythema, poor dentition  Eyes: anicteric sclera, PERRL Cardiac: regular rate and rhythm, nl S1/S2, no murmurs, rubs or gallops  Pulm: CTAB, no wheezes or crackles, no increased work of breathing  Abd: soft, NTND, bowel sounds present  Ext: warm and well perfused, no peripheral edema, no decreased skin turgor    Assessment & Plan:   See Encounters Tab for problem based charting.  Patient seen with Dr. Cleda DaubE. Hoffman

## 2016-12-29 NOTE — Assessment & Plan Note (Signed)
Patient recently admitted for LGIB 2/2 diverticulosis. She was managed conservatively and remained HDS during her admission. She presents for hospital follow up. States she has been doing well since discharge and denies hematochezia or melena. However, daughter is present in the room and states that her stools continue to be dark. Of note, patient is on an iron supplement. Patient has bowel incontinence and daughter has noticed dark stools when she changes the bed sheets. Patient denies abdominal pain, dizziness, weakness or other symptoms concerning for acute worsening anemia. She looks euvolemic on exam.   On further questioning, daughter expressed concern about patient not being able to take care of herself at home. States she forgets to take her medications, has bladder and bowel incontinence, has to be bathe (but refuses to be bathe most of the time), and does not like to be helped by daughter and son who live with her. She also walks her dog by herself and is at risk of potential fall. Patient does not desire to go to a nursing home and would like to stay home and remain independent for as long as she can.   - Check CBC. --> Hgb 9.6 from 9.1 - Check ferritin. If nl can d/c iron supplement.  - Patient and daughter were provided with information about the [facility] program, which she qualifies for. We strongly recommended she considered [facility] as she can greatly benefit from the services they offer.

## 2016-12-29 NOTE — Assessment & Plan Note (Signed)
BP today 98/67. Patient reports no complaints and states she feels well.   - STOP amlodipine 5mg 

## 2016-12-29 NOTE — Assessment & Plan Note (Signed)
Daughter reports patient is having difficulty swallowing metformin tablet. Patient complains the pill is too bitter to swallow and she feels it gets stuck in her throat. A1c 8 on 11/2016.   - Will switch to metformin XR and will decrease dose from 500mg  BID to 500mg  QD

## 2017-01-01 NOTE — Progress Notes (Signed)
Internal Medicine Clinic Attending  I saw and evaluated the patient.  I personally confirmed the key portions of the history and exam documented by Dr. Santos-Sanchez and I reviewed pertinent patient test results.  The assessment, diagnosis, and plan were formulated together and I agree with the documentation in the resident's note. 

## 2017-02-19 NOTE — Addendum Note (Signed)
Addended by: Neomia Dear on: 02/19/2017 07:04 PM   Modules accepted: Orders

## 2017-03-29 ENCOUNTER — Observation Stay (HOSPITAL_COMMUNITY)
Admission: EM | Admit: 2017-03-29 | Discharge: 2017-03-30 | Disposition: A | Discharge status: Home / Self Care | Payer: Medicare (Managed Care) | Attending: Internal Medicine | Admitting: Internal Medicine

## 2017-03-29 ENCOUNTER — Other Ambulatory Visit: Payer: Self-pay

## 2017-03-29 ENCOUNTER — Encounter (HOSPITAL_COMMUNITY): Payer: Self-pay

## 2017-03-29 ENCOUNTER — Emergency Department (HOSPITAL_COMMUNITY): Payer: Medicare (Managed Care)

## 2017-03-29 DIAGNOSIS — D509 Iron deficiency anemia, unspecified: Secondary | ICD-10-CM | POA: Insufficient documentation

## 2017-03-29 DIAGNOSIS — Z79899 Other long term (current) drug therapy: Secondary | ICD-10-CM | POA: Diagnosis not present

## 2017-03-29 DIAGNOSIS — R2689 Other abnormalities of gait and mobility: Secondary | ICD-10-CM | POA: Insufficient documentation

## 2017-03-29 DIAGNOSIS — Z7984 Long term (current) use of oral hypoglycemic drugs: Secondary | ICD-10-CM | POA: Insufficient documentation

## 2017-03-29 DIAGNOSIS — D649 Anemia, unspecified: Secondary | ICD-10-CM

## 2017-03-29 DIAGNOSIS — K5730 Diverticulosis of large intestine without perforation or abscess without bleeding: Secondary | ICD-10-CM | POA: Insufficient documentation

## 2017-03-29 DIAGNOSIS — I10 Essential (primary) hypertension: Secondary | ICD-10-CM | POA: Insufficient documentation

## 2017-03-29 DIAGNOSIS — K922 Gastrointestinal hemorrhage, unspecified: Secondary | ICD-10-CM | POA: Diagnosis present

## 2017-03-29 DIAGNOSIS — R2681 Unsteadiness on feet: Secondary | ICD-10-CM | POA: Diagnosis not present

## 2017-03-29 HISTORY — DX: Anemia, unspecified: D64.9

## 2017-03-29 HISTORY — DX: Diverticulosis of intestine, part unspecified, without perforation or abscess without bleeding: K57.90

## 2017-03-29 LAB — BASIC METABOLIC PANEL
Anion gap: 8 (ref 5–15)
BUN: 20 mg/dL (ref 6–20)
CO2: 29 mmol/L (ref 22–32)
Calcium: 9.6 mg/dL (ref 8.9–10.3)
Chloride: 101 mmol/L (ref 101–111)
Creatinine, Ser: 1.02 mg/dL — ABNORMAL HIGH (ref 0.44–1.00)
GFR calc Af Amer: 52 mL/min — ABNORMAL LOW (ref >60)
GFR calc non Af Amer: 45 mL/min — ABNORMAL LOW (ref >60)
Glucose, Bld: 169 mg/dL — ABNORMAL HIGH (ref 65–99)
Potassium: 4.1 mmol/L (ref 3.5–5.1)
Sodium: 138 mmol/L (ref 135–145)

## 2017-03-29 LAB — CBC WITH DIFFERENTIAL/PLATELET
Basophils Absolute: 0 10*3/uL (ref 0.0–0.1)
Basophils Relative: 0 %
Eosinophils Absolute: 0.1 10*3/uL (ref 0.0–0.7)
Eosinophils Relative: 2 %
HCT: 20.8 % — ABNORMAL LOW (ref 36.0–46.0)
Hemoglobin: 5.8 g/dL — CL (ref 12.0–15.0)
Lymphocytes Relative: 17 %
Lymphs Abs: 0.8 10*3/uL (ref 0.7–4.0)
MCH: 21.6 pg — ABNORMAL LOW (ref 26.0–34.0)
MCHC: 27.9 g/dL — ABNORMAL LOW (ref 30.0–36.0)
MCV: 77.3 fL — ABNORMAL LOW (ref 78.0–100.0)
Monocytes Absolute: 0.3 10*3/uL (ref 0.1–1.0)
Monocytes Relative: 5 %
Neutro Abs: 3.6 10*3/uL (ref 1.7–7.7)
Neutrophils Relative %: 75 %
Platelets: 261 10*3/uL (ref 150–400)
RBC: 2.69 MIL/uL — ABNORMAL LOW (ref 3.87–5.11)
RDW: 16.3 % — ABNORMAL HIGH (ref 11.5–15.5)
WBC: 4.7 10*3/uL (ref 4.0–10.5)

## 2017-03-29 LAB — CBC
HCT: 26.5 % — ABNORMAL LOW (ref 36.0–46.0)
Hemoglobin: 8.1 g/dL — ABNORMAL LOW (ref 12.0–15.0)
MCH: 24 pg — AB (ref 26.0–34.0)
MCHC: 30.6 g/dL (ref 30.0–36.0)
MCV: 78.4 fL (ref 78.0–100.0)
PLATELETS: 205 10*3/uL (ref 150–400)
RBC: 3.38 MIL/uL — ABNORMAL LOW (ref 3.87–5.11)
RDW: 16.2 % — AB (ref 11.5–15.5)
WBC: 5.3 10*3/uL (ref 4.0–10.5)

## 2017-03-29 LAB — PREPARE RBC (CROSSMATCH)

## 2017-03-29 LAB — PROTIME-INR
INR: 1.05
Prothrombin Time: 13.6 seconds (ref 11.4–15.2)

## 2017-03-29 LAB — POC OCCULT BLOOD, ED: Fecal Occult Bld: POSITIVE — AB

## 2017-03-29 MED ORDER — PROMETHAZINE HCL 25 MG PO TABS: 12.5000 mg | ORAL_TABLET | Freq: Four times a day (QID) | ORAL | Status: DC | PRN

## 2017-03-29 MED ORDER — SODIUM CHLORIDE 0.9 % IV SOLN
INTRAVENOUS | Status: AC
  Administered 2017-03-29: 20:00:00 via INTRAVENOUS

## 2017-03-29 MED ORDER — ENSURE ENLIVE PO LIQD
237.0000 mL | Freq: Two times a day (BID) | ORAL | Status: DC
  Administered 2017-03-30: 237 mL via ORAL

## 2017-03-29 MED ORDER — POLYSACCHARIDE IRON COMPLEX 150 MG PO CAPS
150.0000 mg | ORAL_CAPSULE | Freq: Every day | ORAL | Status: DC
  Administered 2017-03-29: 150 mg via ORAL
  Filled 2017-03-29: qty 1

## 2017-03-29 MED ORDER — SODIUM CHLORIDE 0.9 % IV SOLN
10.0000 mL/h | Freq: Once | INTRAVENOUS | Status: AC
  Administered 2017-03-29: 10 mL/h via INTRAVENOUS

## 2017-03-29 MED ORDER — ACETAMINOPHEN 325 MG PO TABS: 650.0000 mg | ORAL_TABLET | Freq: Four times a day (QID) | ORAL | Status: DC | PRN

## 2017-03-29 MED ORDER — PANTOPRAZOLE SODIUM 40 MG IV SOLR
40.0000 mg | Freq: Two times a day (BID) | INTRAVENOUS | Status: DC
  Administered 2017-03-29 – 2017-03-30 (×2): 40 mg via INTRAVENOUS
  Filled 2017-03-29 (×3): qty 40

## 2017-03-29 MED ORDER — ACETAMINOPHEN 650 MG RE SUPP: 650.0000 mg | Freq: Four times a day (QID) | RECTAL | Status: DC | PRN

## 2017-03-29 NOTE — ED Provider Notes (Signed)
[facility] EMERGENCY DEPARTMENT Provider Note   CSN: [id number] Arrival date & time: 03/29/17  1158     History   Chief Complaint Chief Complaint  Patient presents with  . Abnormal Lab    HGB 5.6    HPI Sharon Conway is a 83 y.o. female.  The history is provided by the patient and medical records. No language interpreter was used.  Abnormal Lab   Sharon Conway is a 83 y.o. female  with a PMH of prior lower GI bleed, DM, HTN who presents to the Emergency Department with daughter for low hgb noted for routine blood work performed by [employer] [facility] [place]. Daughter was called today and notified that patient's globin was 5.3.  He was told to come straight to the emergency department.  Patient does endorse weakness and fatigue. Denies abdominal pain, nausea, vomiting, diarrhea, vaginal bleeding, fever, chills. Patient denies any blood in her stool, however states that she is on iron, so her stool always looks black.  She does have a history of a prior lower GI bleed thought to be diverticular in origin last year.  Past Medical History:  Diagnosis Date  . Diabetes mellitus without complication (HCC)    type 2  . GI bleed 09/2015  . Hypertension     Patient Active Problem List   Diagnosis Date Noted  . GI bleed 03/29/2017  . Lower GI bleed 12/21/2016  . Diverticulosis of colon with hemorrhage   . Hypertension 11/21/2016  . Chronic anemia 10/28/2015  . Malnutrition of moderate degree 10/01/2015  . Gastrointestinal hemorrhage associated with intestinal diverticulitis   . Diabetes mellitus type II, controlled (HCC) 09/22/2015    Past Surgical History:  Procedure Laterality Date  . ABDOMINAL HYSTERECTOMY      OB History    No data available       Home Medications    Prior to Admission medications   Medication Sig Start Date End Date Taking? Authorizing Provider  feeding supplement, ENSURE ENLIVE, (ENSURE ENLIVE) LIQD Take 237 mLs by mouth 2 (two) times daily  between meals. 10/03/15  Yes Ahmed, Elyn Peers, MD  iron polysaccharides (NIFEREX) 150 MG capsule Take 1 capsule (150 mg total) by mouth daily. 11/21/16  Yes Molt, Bethany, DO  metFORMIN (GLUCOPHAGE XR) 500 MG 24 hr tablet Take 1 tablet (500 mg total) by mouth daily with breakfast. 12/28/16 12/28/17 Yes Santos-Sanchez, Chelsea Primus, MD  rOPINIRole (REQUIP) 2 MG tablet Take 2 mg by mouth at bedtime.   Yes [provider]  senna-docusate (SENOKOT-S) 8.6-50 MG tablet Take 1 tablet by mouth 2 (two) times daily. Patient taking differently: Take 1 tablet by mouth 2 (two) times daily as needed for mild constipation.  10/03/15  Yes Ahmed, Elyn Peers, MD  solifenacin (VESICARE) 5 MG tablet Take 1 tablet (5 mg total) by mouth daily. 11/21/16  Yes Molt, Bethany, DO    Family History No family history on file.  Social History Social History   Tobacco Use  . Smoking status: Never Smoker  . Smokeless tobacco: Never Used  Substance Use Topics  . Alcohol use: No    Alcohol/week: 0.0 oz  . Drug use: No     Allergies   Patient has no known allergies.   Review of Systems Review of Systems  Constitutional: Positive for fatigue.  Neurological: Positive for weakness.  All other systems reviewed and are negative.    Physical Exam Updated Vital Signs BP (!) 146/61   Pulse 88   Temp  98.3 F (36.8 C) (Oral)   Resp 19   SpO2 95%   Physical Exam  Constitutional: She is oriented to person, place, and time. She appears well-developed and well-nourished. No distress.  HENT:  Head: Normocephalic and atraumatic.  Cardiovascular: Normal rate, regular rhythm and normal heart sounds.  No murmur heard. Pulmonary/Chest: Effort normal and breath sounds normal. No respiratory distress.  Abdominal: Soft. She exhibits no distension. There is no tenderness.  Genitourinary: Rectal exam shows guaiac positive stool.  Neurological: She is alert and oriented to person, place, and time.  Skin: Skin is warm and dry.    Nursing note and vitals reviewed.    ED Treatments / Results  Labs (all labs ordered are listed, but only abnormal results are displayed) Labs Reviewed  CBC WITH DIFFERENTIAL/PLATELET - Abnormal; Notable for the following components:      Result Value   RBC 2.69 (*)    Hemoglobin 5.8 (*)    HCT 20.8 (*)    MCV 77.3 (*)    MCH 21.6 (*)    MCHC 27.9 (*)    RDW 16.3 (*)    All other components within normal limits  BASIC METABOLIC PANEL - Abnormal; Notable for the following components:   Glucose, Bld 169 (*)    Creatinine, Ser 1.02 (*)    GFR calc non Af Amer 45 (*)    GFR calc Af Amer 52 (*)    All other components within normal limits  POC OCCULT BLOOD, ED - Abnormal; Notable for the following components:   Fecal Occult Bld POSITIVE (*)    All other components within normal limits  PROTIME-INR  CBC  CBC  TYPE AND SCREEN  PREPARE RBC (CROSSMATCH)    EKG  EKG Interpretation None       Radiology Dg Chest Portable 1 View  Result Date: 03/29/2017 CLINICAL DATA:  Recent fall, pain in the right shoulder, shortness of breath EXAM: PORTABLE CHEST 1 VIEW COMPARISON:  Chest x-ray of 09/22/2015 FINDINGS: The lungs appear somewhat hyperaerated. No pneumonia or effusion is seen. Mediastinal and hilar contours are unremarkable. The heart is moderately enlarged and stable. There may be a hiatal hernia present. There is some deviation of the superior tracheal air shadow to the right of midline. This could indicate prominence of the left lobe of thyroid. Clinical correlation is recommended. IMPRESSION: 1. No active lung disease.  Slight hyper aeration. 2. Stable moderate cardiomegaly. 3. Slight deviation of superior tracheal air shadow to the right. Question enlargement of the left lobe of thyroid. Electronically Signed   By: Dwyane DeePaul  Barry M.D.   On: 03/29/2017 14:05   Dg Shoulder Right Portable  Result Date: 03/29/2017 CLINICAL DATA:  Recent fall. Pain right shoulder. Shortness of  breath. Symptoms for 2 days. EXAM: PORTABLE RIGHT SHOULDER COMPARISON:  03/29/2017 FINDINGS: No acute fracture or subluxation. There are degenerative changes in the acromioclavicular joint. Subacromial narrowing and humeral head osteophytes are present. IMPRESSION: Chronic changes in the right shoulder. No evidence for acute abnormality. Electronically Signed   By: Norva PavlovElizabeth  Brown M.D.   On: 03/29/2017 14:14    Procedures Procedures (including critical care time)  CRITICAL CARE Performed by: Chase PicketJaime Pilcher Cleven Jansma   Total critical care time: 40 minutes  Critical care time was exclusive of separately billable procedures and treating other patients.  Critical care was necessary to treat or prevent imminent or life-threatening deterioration.  Critical care was time spent personally by me on the following activities: development of treatment  plan with patient and/or surrogate as well as nursing, discussions with consultants, evaluation of patient's response to treatment, examination of patient, obtaining history from patient or surrogate, ordering and performing treatments and interventions, ordering and review of laboratory studies, ordering and review of radiographic studies, pulse oximetry and re-evaluation of patient's condition.   Medications Ordered in ED Medications  0.9 %  sodium chloride infusion (10 mL/hr Intravenous New Bag/Given 03/29/17 1412)     Initial Impression / Assessment and Plan / ED Course  I have reviewed the triage vital signs and the nursing notes.  Pertinent labs & imaging results that were available during my care of the patient were reviewed by me and considered in my medical decision making (see chart for details).    Franco Nonesdna Stebbins is a 83 y.o. female who presents to ED for low hemoglobin noted in routine blood work by PCP. Hx of prior lower GI bleed last year. Hgb in ED 5.8. HR and BP stable. Hemoccult +. Will transfuse 2U and admit.   3:00 PM -  Spoke with [facility] who will evaluate patient and follow in consultation.   IM Teaching Service consulted who will admit.   Patient seen by and discussed with Dr. Madilyn Hookees who agrees with treatment plan.    Final Clinical Impressions(s) / ED Diagnoses   Final diagnoses:  Low hemoglobin  Gastrointestinal hemorrhage, unspecified gastrointestinal hemorrhage type    ED Discharge Orders    None       Biddie Sebek, Chase PicketJaime Pilcher, PA-C 03/29/17 1508    Tilden Fossaees, Elizabeth, MD 03/30/17 [phone]

## 2017-03-29 NOTE — Progress Notes (Signed)
Patients BP 187/91, pulse 93, temp 99.3. Patient has 1 unit RBC infusing. MD notified. Orders given to get manual BP and orthostatic vital signs. Orders followed. Will continue to monitor.

## 2017-03-29 NOTE — ED Triage Notes (Signed)
Pt sent over by [facility] after blood work from yesterday revealed HGB 5.6. Pt endorses generalized weakness, dyspnea with exertion. Denies bloody stool or dizziness. NAD VSS

## 2017-03-29 NOTE — H&P (Signed)
Date: 03/29/2017               Patient Name:  [name] MRN: [id number]  DOB: 1919/01/14 Age / Sex: 83 y.o., female   PCP: Noemi ChapelMolt, Bethany, DO         Medical Service: Internal Medicine Teaching Service         Attending Physician: Dr. Earl LagosNarendra, Nischal, MD    First Contact: Dr. Johnny BridgeSaraiya Pager: [phone]  Second Contact: Dr. Delma Officerhundi Pager: [phone]       After Hours (After 5p/  First Contact Pager: [phone]  weekends / holidays): Second Contact Pager: [phone]   Chief Complaint: Gastrointestinal bleed  History of Present Illness: [name] is a 83 year old female with past medical history of hypertension, diabetes mellitus, prior lower GI bleed who presented to be seen for gastrointestinal bleed.  The history was gathered by speaking to the patient and by chart review.  The patient seems to have some baseline dementia poor historian  The patient states that she has noticed some bright red blood on the toilet paper when she goes to defecate for the past 1-2 days.  The patient was told to come to to the emergency room directly from the [facility] who did routine blood work which showed a hemoglobin of 5.3.  The patient states that she has some constipation regularly and has a bowel movement once a week.  She states that she gets short of breath when walking a few blocks.  She denies any abdominal pain, nausea, lightheadedness, headaches, blurry vision, chest pain.  Some recent fall over the past week.  She states that her right shoulder has been hurting since the fall.  ED course: Transfused 2 units, consulted gastroenterology, EKG, chest x-ray, right shoulder x-ray  Meds:  Current Meds  Medication Sig  . feeding supplement, ENSURE ENLIVE, (ENSURE ENLIVE) LIQD Take 237 mLs by mouth 2 (two) times daily between meals.  . iron polysaccharides (NIFEREX) 150 MG capsule Take 1 capsule (150 mg total) by mouth daily.  . metFORMIN (GLUCOPHAGE XR) 500 MG 24 hr tablet Take 1 tablet (500 mg total)  by mouth daily with breakfast.  . rOPINIRole (REQUIP) 2 MG tablet Take 2 mg by mouth at bedtime.  . senna-docusate (SENOKOT-S) 8.6-50 MG tablet Take 1 tablet by mouth 2 (two) times daily. (Patient taking differently: Take 1 tablet by mouth 2 (two) times daily as needed for mild constipation. )  . solifenacin (VESICARE) 5 MG tablet Take 1 tablet (5 mg total) by mouth daily.     Allergies: Allergies as of 03/29/2017  . (No Known Allergies)   Past Medical History:  Diagnosis Date  . Anemia   . Diabetes mellitus without complication (HCC)    type 2  . Diverticulosis   . GI bleed 09/2015  . Hypertension     Family History:   Daughter- hip problems, diabetes mellitus  Social History:   Non-smoker, no alcohol use, no drug use  Review of Systems: A complete ROS was negative except as per HPI.   Physical Exam: Blood pressure (!) 187/91, pulse 93, temperature 98.3 F (36.8 C), temperature source Oral, resp. rate 20, SpO2 100 %. Physical Exam  Constitutional: She appears well-developed and well-nourished. No distress.  HENT:  Head: Normocephalic and atraumatic.  Eyes: Conjunctivae are normal.  Cardiovascular: Normal rate, regular rhythm, normal heart sounds and intact distal pulses.  Pulmonary/Chest: Effort normal and breath sounds normal. No stridor. No respiratory distress.  Abdominal: Soft. Bowel  sounds are normal. She exhibits no distension. There is no tenderness.  Skin: She is not diaphoretic.  Psychiatric: She has a normal mood and affect. Her behavior is normal. Judgment and thought content normal.   CBC    Component Value Date/Time   WBC 4.7 03/29/2017 1235   RBC 2.69 (L) 03/29/2017 1235   HGB 5.8 (LL) 03/29/2017 1235   HCT 20.8 (L) 03/29/2017 1235   HCT 29.2 (L) 10/28/2015 1630   PLT 261 03/29/2017 1235   MCV 77.3 (L) 03/29/2017 1235   MCH 21.6 (L) 03/29/2017 1235   MCHC 27.9 (L) 03/29/2017 1235   RDW 16.3 (H) 03/29/2017 1235   LYMPHSABS 0.8 03/29/2017 1235    MONOABS 0.3 03/29/2017 1235   EOSABS 0.1 03/29/2017 1235   BASOSABS 0.0 03/29/2017 1235    Basic metabolic panel: Sodium = 138, potassium = 4.1, creatinine = 1.02, BUN = 20, anion gap = 8  EKG: Pending  CXR: Cardiomegaly, tracheal deviation to the right  X-ray shoulder right: No acute abnormalities.  Chronic changes noted in the right shoulder  Assessment & Plan by Problem:  83 year old female with past medical history of hypertension, hyperlipidemia, diabetes mellitus who presented with hemoglobin of 5.8 thought to be due to gastrointestinal bleed.  Gastrointestinal bleed The patient has a history of lower GI bleed.  His previous colonoscopy (10/01/2015) showed severe diverticulosis in the entire examined colon, no diverticular bleeding. The patient had a prior hospitalization from 12/21/2016-12/22/2016 for lower GI bleed thought to be due to colonic diverticulosis that was recurrent in nature.  At that time the patient was discharged with a hemoglobin of 9.1.   Based on the patient's history it is likely that the patient's drop in hemoglobin is due to a gastrointestinal etiology, especially since she states that she has noted blood on the toilet paper.  The GI bleed is likely to be lower GI and from diverticulosis.    Hemoglobin in the ED was 5.8.  Hemoccult was positive -Transfused 2 units, posttransfusion CBC to be collected -Gastroenterology consulted -EKG pending -IV pantoprazole 40mg  every 12 hours -Orthostatic vital signs: Lying (180/90, 82), sitting(184/94, 86), standing (154/90, 84), standing at 3 minutes (154/90, 84).  The patient's orthostatic vital signs are positive. -0.9% Nacl 14725ml/hr for the orthostasis -CBC every 6 hours -Phenergan 12.5 mg every 6 hours as needed -PT OT evaluation  Essential hypertension Patient's blood pressure since his admission has ranged 116-187/50-91.  Patient is not on any home medication as previous medication has been stopped. Not placed on  any antihypertensive medications due to the GI bleed.  Diabetes mellitus -SSI  Iron deficiency anemia -Ferritin pending  DVT P PX: SCDs  Dispo: Admit patient to Inpatient with expected length of stay greater than 2 midnights.  Signed: Lorenso CourierVahini Arva Slaugh, MD Internal Medicine PGY1 Pager:[phone] 03/29/2017, 6:51 PM

## 2017-03-29 NOTE — ED Notes (Signed)
Date and time results received: 03/29/17 1:27 PM  Test: Hemoglobin  Critical Value: 5.8  Name of Provider Notified: Ward MD

## 2017-03-29 NOTE — Progress Notes (Signed)
Orthostatic vitals are as follows:  Lying: BP 180/90 Pulse 86  Sitting: BP 184/94 Pulse 82  Standing: BP 154/90 Pulse 84   No complications. MD notified of these findings. Will continue to monitor.

## 2017-03-29 NOTE — Evaluation (Signed)
Physical Therapy Evaluation Patient Details Name: Sharon Conway MRN: [id number] DOB: [birth year] Today's Date: 03/29/2017   History of Present Illness  Pt is a 83 y/o female [facility] to very low hemoglobin level. PMH including but not limited to DM and HTN.  Clinical Impression  Pt presented supine in bed with HOB elevated, awake and willing to participate in therapy session. Pt's daughter present throughout session as well. Prior to admission, pt reported that she was independent with ADLs and ambulated with a SPC. [facility] for therapy services. Pt ambulated within her room with min A for stability without an AD, min guard for safety with 1HHA and pushing IV pole. Pt would continue to benefit from skilled physical therapy services at this time while admitted and after d/c to address the below listed limitations in order to improve overall safety and independence with functional mobility.     Follow Up Recommendations Supervision/Assistance - 24 hour;Other (comment)(continue with therapy through [facility])    Equipment Recommendations  None recommended by PT    Recommendations for Other Services       Precautions / Restrictions Precautions Precautions: Fall Restrictions Weight Bearing Restrictions: No      Mobility  Bed Mobility Overal bed mobility: Modified Independent                Transfers Overall transfer level: Needs assistance Equipment used: None Transfers: Sit to/from Stand Sit to Stand: Min assist         General transfer comment: for stability  Ambulation/Gait Ambulation/Gait assistance: Min assist;Min guard Ambulation Distance (Feet): 25 Feet Assistive device: 1 person hand held assist(pushed IV pole) Gait Pattern/deviations: Step-through pattern;Decreased step length - right;Decreased step length - left;Decreased stride length;Trunk flexed Gait velocity: decreased Gait velocity interpretation: <1.8 ft/sec, indicative of  risk for recurrent falls General Gait Details: modest instability requiring constant min A for safety; pt frequently reaching for support surfaces with bilateral UEs; min guard with 1HHA and pushing IV pole  Stairs            Wheelchair Mobility    Modified Rankin (Stroke Patients Only)       Balance Overall balance assessment: Needs assistance Sitting-balance support: Feet supported Sitting balance-Leahy Scale: Good     Standing balance support: During functional activity;Single extremity supported Standing balance-Leahy Scale: Poor                               Pertinent Vitals/Pain Pain Assessment: No/denies pain    Home Living Family/patient expects to be discharged to:: Private residence Living Arrangements: Children Available Help at Discharge: Family;Available 24 hours/day Type of Home: Apartment Home Access: Stairs to enter Entrance Stairs-Rails: LawyerLeft;Right Entrance Stairs-Number of Steps: 17 Home Layout: One level Home Equipment: Walker - 2 wheels;Cane - single point;Other (comment)([facility] has ordered her a shower seat)      Prior Function Level of Independence: Independent with assistive device(s)         Comments: ambulates with use of SPC, independent with ADLs, [facility] services     Hand Dominance   Dominant Hand: Right    Extremity/Trunk Assessment   Upper Extremity Assessment Upper Extremity Assessment: Generalized weakness    Lower Extremity Assessment Lower Extremity Assessment: Generalized weakness       Communication   Communication: No difficulties  Cognition Arousal/Alertness: Awake/alert Behavior During Therapy: WFL for tasks assessed/performed Overall Cognitive Status: Impaired/Different  from baseline Area of Impairment: Problem solving                             Problem Solving: Difficulty sequencing        General Comments      Exercises     Assessment/Plan     PT Assessment Patient needs continued PT services  PT Problem List Decreased strength;Decreased mobility;Decreased balance;Decreased coordination;Decreased knowledge of use of DME;Decreased safety awareness       PT Treatment Interventions DME instruction;Gait training;Stair training;Functional mobility training;Therapeutic activities;Therapeutic exercise;Balance training;Neuromuscular re-education;Patient/family education    PT Goals (Current goals can be found in the Care Plan section)  Acute Rehab PT Goals Patient Stated Goal: return home PT Goal Formulation: With patient/family Time For Goal Achievement: 04/12/17 Potential to Achieve Goals: Good    Frequency Min 3X/week   Barriers to discharge        Co-evaluation               AM-PAC PT "6 Clicks" Daily Activity  Outcome Measure Difficulty turning over in bed (including adjusting bedclothes, sheets and blankets)?: A Little Difficulty moving from lying on back to sitting on the side of the bed? : A Little Difficulty sitting down on and standing up from a chair with arms (e.g., wheelchair, bedside commode, etc,.)?: A Little Help needed moving to and from a bed to chair (including a wheelchair)?: A Little Help needed walking in hospital room?: A Little Help needed climbing 3-5 steps with a railing? : A Lot 6 Click Score: 17    End of Session Equipment Utilized During Treatment: Gait belt Activity Tolerance: Patient tolerated treatment well Patient left: in bed;with call bell/phone within reach;with bed alarm set;with family/visitor present Nurse Communication: Mobility status PT Visit Diagnosis: Unsteadiness on feet (R26.81);Other abnormalities of gait and mobility (R26.89)    Time: [id number] PT Time Calculation (min) (ACUTE ONLY): 23 min   Charges:   PT Evaluation $PT Eval Moderate Complexity: 1 Mod PT Treatments $Therapeutic Activity: 8-22 mins   PT G Codes:   PT G-Codes **NOT FOR INPATIENT  CLASS** Functional Assessment Tool Used: AM-PAC 6 Clicks Basic Mobility;Clinical judgement Functional Limitation: Mobility: Walking and moving around Mobility: Walking and Moving Around Current Status ([id number](G8978): At least 40 percent but less than 60 percent impaired, limited or restricted Mobility: Walking and Moving Around Goal Status [phone]([phone]): At least 1 percent but less than 20 percent impaired, limited or restricted    [facility] CenterJennifer Sutter Ahlgren, [place] CarolinaPT, DPT [phone]   Alessandra BevelsJennifer M Deane Wattenbarger 03/29/2017, 5:32 PM

## 2017-03-30 DIAGNOSIS — D509 Iron deficiency anemia, unspecified: Secondary | ICD-10-CM | POA: Diagnosis not present

## 2017-03-30 DIAGNOSIS — K922 Gastrointestinal hemorrhage, unspecified: Secondary | ICD-10-CM | POA: Diagnosis not present

## 2017-03-30 DIAGNOSIS — K921 Melena: Secondary | ICD-10-CM | POA: Diagnosis not present

## 2017-03-30 DIAGNOSIS — D649 Anemia, unspecified: Secondary | ICD-10-CM | POA: Diagnosis not present

## 2017-03-30 DIAGNOSIS — K5730 Diverticulosis of large intestine without perforation or abscess without bleeding: Secondary | ICD-10-CM | POA: Diagnosis not present

## 2017-03-30 DIAGNOSIS — I10 Essential (primary) hypertension: Secondary | ICD-10-CM | POA: Diagnosis not present

## 2017-03-30 LAB — COMPREHENSIVE METABOLIC PANEL
ALT: 12 U/L — AB (ref 14–54)
AST: 17 U/L (ref 15–41)
Albumin: 3.1 g/dL — ABNORMAL LOW (ref 3.5–5.0)
Alkaline Phosphatase: 42 U/L (ref 38–126)
Anion gap: 6 (ref 5–15)
BUN: 14 mg/dL (ref 6–20)
CHLORIDE: 104 mmol/L (ref 101–111)
CO2: 27 mmol/L (ref 22–32)
CREATININE: 0.88 mg/dL (ref 0.44–1.00)
Calcium: 8.9 mg/dL (ref 8.9–10.3)
GFR calc Af Amer: >60 mL/min (ref >60)
GFR, EST NON AFRICAN AMERICAN: 54 mL/min — AB (ref >60)
GLUCOSE: 90 mg/dL (ref 65–99)
Potassium: 4.1 mmol/L (ref 3.5–5.1)
SODIUM: 137 mmol/L (ref 135–145)
Total Bilirubin: 0.9 mg/dL (ref 0.3–1.2)
Total Protein: 6.5 g/dL (ref 6.5–8.1)

## 2017-03-30 LAB — FERRITIN: Ferritin: 6 ng/mL — ABNORMAL LOW (ref 11–307)

## 2017-03-30 LAB — CBC
HCT: 26.9 % — ABNORMAL LOW (ref 36.0–46.0)
HEMATOCRIT: 29.9 % — AB (ref 36.0–46.0)
HEMOGLOBIN: 9 g/dL — AB (ref 12.0–15.0)
Hemoglobin: 8.2 g/dL — ABNORMAL LOW (ref 12.0–15.0)
MCH: 24.3 pg — ABNORMAL LOW (ref 26.0–34.0)
MCH: 24.1 pg — ABNORMAL LOW (ref 26.0–34.0)
MCHC: 30.5 g/dL (ref 30.0–36.0)
MCHC: 30.1 g/dL (ref 30.0–36.0)
MCV: 79.6 fL (ref 78.0–100.0)
MCV: 79.9 fL (ref 78.0–100.0)
PLATELETS: 217 10*3/uL (ref 150–400)
Platelets: 216 10*3/uL (ref 150–400)
RBC: 3.38 MIL/uL — AB (ref 3.87–5.11)
RBC: 3.74 MIL/uL — AB (ref 3.87–5.11)
RDW: 16.8 % — ABNORMAL HIGH (ref 11.5–15.5)
RDW: 16.4 % — ABNORMAL HIGH (ref 11.5–15.5)
WBC: 4.5 10*3/uL (ref 4.0–10.5)
WBC: 4.4 10*3/uL (ref 4.0–10.5)

## 2017-03-30 LAB — TYPE AND SCREEN
ABO/RH(D): O POS
Antibody Screen: NEGATIVE
Unit division: 0
Unit division: 0

## 2017-03-30 LAB — BPAM RBC
Blood Product Expiration Date: 201811302359
Blood Product Expiration Date: 201811302359
ISSUE DATE / TIME: 201811081401
ISSUE DATE / TIME: 201811081629
Unit Type and Rh: 5100
Unit Type and Rh: 5100

## 2017-03-30 LAB — GLUCOSE, CAPILLARY: Glucose-Capillary: 79 mg/dL (ref 65–99)

## 2017-03-30 MED ORDER — PANTOPRAZOLE SODIUM 40 MG IV SOLR: 40.0000 mg | Freq: Two times a day (BID) | INTRAVENOUS | 0 refills | Status: DC

## 2017-03-30 MED ORDER — SODIUM CHLORIDE 0.9 % IV SOLN
510.0000 mg | Freq: Once | INTRAVENOUS | Status: AC
  Administered 2017-03-30: 510 mg via INTRAVENOUS
  Filled 2017-03-30: qty 17

## 2017-03-30 MED ORDER — PANTOPRAZOLE SODIUM 40 MG PO TBEC: 40.0000 mg | DELAYED_RELEASE_TABLET | Freq: Two times a day (BID) | ORAL | 0 refills | Status: DC

## 2017-03-30 NOTE — Evaluation (Signed)
Occupational Therapy Evaluation Patient Details Name: Sharon Conway MRN: [id number] DOB: 07/06/1918 Today's Date: 03/30/2017    History of Present Illness Pt is a 83 y/o female [facility] to very low hemoglobin level. PMH including but not limited to DM and HTN.   Clinical Impression   Pt with decline in function and safety with ADLs and ADL mobility with decreased strength, balance and endurance. Pt would benefit from acute OT services to address impairments to maximize level of function and safety    Follow Up Recommendations  Supervision/Assistance - 24 hour;No OT follow up    Equipment Recommendations  Tub/shower seat;Other (comment)(reacher)    Recommendations for Other Services       Precautions / Restrictions Precautions Precautions: Fall Restrictions Weight Bearing Restrictions: No      Mobility Bed Mobility Overal bed mobility: Modified Independent                Transfers Overall transfer level: Needs assistance Equipment used: None Transfers: Sit to/from Stand Sit to Stand: Min assist         General transfer comment: for stability    Balance Overall balance assessment: Needs assistance Sitting-balance support: Feet supported Sitting balance-Leahy Scale: Good     Standing balance support: During functional activity;Single extremity supported Standing balance-Leahy Scale: Poor                             ADL either performed or assessed with clinical judgement   ADL Overall ADL's : Needs assistance/impaired     Grooming: Wash/dry hands;Wash/dry face;Standing;Min guard   Upper Body Bathing: Set up;Supervision/ safety;Sitting   Lower Body Bathing: Minimal assistance;Sit to/from stand;With caregiver independent assisting   Upper Body Dressing : Set up;Supervision/safety;Sitting   Lower Body Dressing: Sit to/from stand;Minimal assistance;With caregiver independent assisting   Toilet Transfer: Minimal  assistance;With caregiver independent assisting;Ambulation;Grab bars;BSC;Comfort height toilet;Cueing for sequencing   Toileting- Clothing Manipulation and Hygiene: Minimal assistance;Sit to/from stand   Tub/ Shower Transfer: 3 in 1;Cueing for safety;Cueing for sequencing;Minimal assistance;With caregiver independent assisting   Functional mobility during ADLs: Rolling walker;Minimal assistance;Cueing for sequencing;Caregiver able to provide necessary level of assistance       Vision Baseline Vision/History: Wears glasses Wears Glasses: At all times Patient Visual Report: No change from baseline       Perception     Praxis      Pertinent Vitals/Pain Pain Assessment: No/denies pain     Hand Dominance Right   Extremity/Trunk Assessment Upper Extremity Assessment Upper Extremity Assessment: Generalized weakness   Lower Extremity Assessment Lower Extremity Assessment: Defer to PT evaluation       Communication Communication Communication: No difficulties   Cognition Arousal/Alertness: Awake/alert Behavior During Therapy: WFL for tasks assessed/performed Overall Cognitive Status: Impaired/Different from baseline Area of Impairment: Problem solving                             Problem Solving: Difficulty sequencing     General Comments   pt very pleasant and cooperative, family supportive    Exercises     Shoulder Instructions      Home Living Family/patient expects to be discharged to:: Private residence Living Arrangements: Children Available Help at Discharge: Family;Available 24 hours/day Type of Home: Apartment Home Access: Stairs to enter Entrance Stairs-Number of Steps: 17 Entrance Stairs-Rails: Left;Right Home Layout: One level     Bathroom Shower/Tub: Tub/shower  unit   Bathroom Toilet: Standard     Home Equipment: Walker - 2 wheels;Cane - single point;Other (comment)(shower seat on order by [facility])          Prior  Functioning/Environment Level of Independence: Independent with assistive device(s)        Comments: ambulates with use of SPC, independent with ADLs, [facility] to [facility] services        OT Problem List: Decreased strength;Decreased activity tolerance;Decreased knowledge of use of DME or AE;Decreased cognition;Decreased safety awareness;Impaired balance (sitting and/or standing)      OT Treatment/Interventions: Self-care/ADL training;DME and/or AE instruction;Therapeutic activities;Balance training;Therapeutic exercise;Patient/family education    OT Goals(Current goals can be found in the care plan section) Acute Rehab OT Goals Patient Stated Goal: return home OT Goal Formulation: With patient/family Time For Goal Achievement: 04/06/17 Potential to Achieve Goals: Good ADL Goals Pt Will Perform Grooming: with set-up;with supervision;with caregiver independent in assisting;standing Pt Will Perform Upper Body Bathing: with set-up;with caregiver independent in assisting Pt Will Perform Lower Body Bathing: with min guard assist;with supervision;with set-up;with caregiver independent in assisting Pt Will Perform Upper Body Dressing: with set-up;with caregiver independent in assisting Pt Will Perform Lower Body Dressing: with min guard assist;with supervision;with set-up;with caregiver independent in assisting Pt Will Transfer to Toilet: with min guard assist;with supervision;ambulating;grab bars;regular height toilet Pt Will Perform Toileting - Clothing Manipulation and hygiene: with min guard assist;with supervision;with caregiver independent in assisting Pt Will Perform Tub/Shower Transfer: with min guard assist;with supervision;shower seat;3 in 1;with caregiver independent in assisting;ambulating  OT Frequency: Min 2X/week   Barriers to D/C:    no barriers       Co-evaluation              AM-PAC PT "6 Clicks" Daily Activity     Outcome Measure Help from another  person eating meals?: None Help from another person taking care of personal grooming?: A Little Help from another person toileting, which includes using toliet, bedpan, or urinal?: A Little Help from another person bathing (including washing, rinsing, drying)?: A Little Help from another person to put on and taking off regular upper body clothing?: A Little Help from another person to put on and taking off regular lower body clothing?: A Little 6 Click Score: 19   End of Session Equipment Utilized During Treatment: Gait belt  Activity Tolerance: Patient tolerated treatment well Patient left: in bed;with bed alarm set;with call bell/phone within reach;with family/visitor present  OT Visit Diagnosis: Unsteadiness on feet (R26.81);Muscle weakness (generalized) (M62.81);History of falling (Z91.81)                Time: [id number] OT Time Calculation (min): 28 min Charges:  OT General Charges $OT Visit: 1 Visit OT Evaluation $OT Eval Moderate Complexity: 1 Mod OT Treatments $Therapeutic Activity: 8-22 mins G-Codes: OT G-codes **NOT FOR INPATIENT CLASS** Functional Assessment Tool Used: AM-PAC 6 Clicks Daily Activity Functional Limitation: Other OT primary Other OT Primary Current Status ([id number](G8990): At least 1 percent but less than 20 percent impaired, limited or restricted Other OT Primary Goal Status ([id number](G8991): At least 20 percent but less than 40 percent impaired, limited or restricted Other OT Primary Discharge Status [phone]): At least 20 percent but less than 40 percent impaired, limited or restricted     Galen ManilaSpencer, Kameko Hukill Jeanette 03/30/2017, 1:49 PM

## 2017-03-30 NOTE — Consult Note (Signed)
[facility] Consultation Note  Referring Provider: Dr. Heide SparkNarendra (Int. Med. Teaching Service) Primary Care Physician:  Noemi ChapelMolt, Bethany, DO Primary Gastroenterologist:  Dr. Carman ChingJames Edwards  Reason for Consultation:  GI bleeding  HPI: Sharon Conway is a 83 y.o. female admitted for anemia.  Reports of some red blood on tissue, but patient doesn't recall this.  Colonoscopy for anemia and overt hematochezia Spring 2017 showed diverticulosis alone.  She has no abdominal pain, dysphagia, change in bowel habits.   Past Medical History:  Diagnosis Date  . Anemia   . Diabetes mellitus without complication (HCC)    type 2  . Diverticulosis   . GI bleed 09/2015  . Hypertension     Past Surgical History:  Procedure Laterality Date  . ABDOMINAL HYSTERECTOMY      Prior to Admission medications   Medication Sig Start Date End Date Taking? Authorizing Provider  feeding supplement, ENSURE ENLIVE, (ENSURE ENLIVE) LIQD Take 237 mLs by mouth 2 (two) times daily between meals. 10/03/15  Yes Ahmed, Elyn Peersasrif, MD  iron polysaccharides (NIFEREX) 150 MG capsule Take 1 capsule (150 mg total) by mouth daily. 11/21/16  Yes Molt, Bethany, DO  metFORMIN (GLUCOPHAGE XR) 500 MG 24 hr tablet Take 1 tablet (500 mg total) by mouth daily with breakfast. 12/28/16 12/28/17 Yes Santos-Sanchez, Chelsea PrimusIdalys, MD  rOPINIRole (REQUIP) 2 MG tablet Take 2 mg by mouth at bedtime.   Yes [provider]  senna-docusate (SENOKOT-S) 8.6-50 MG tablet Take 1 tablet by mouth 2 (two) times daily. Patient taking differently: Take 1 tablet by mouth 2 (two) times daily as needed for mild constipation.  10/03/15  Yes Ahmed, Elyn Peersasrif, MD  solifenacin (VESICARE) 5 MG tablet Take 1 tablet (5 mg total) by mouth daily. 11/21/16  Yes Molt, Bethany, DO    Current Facility-Administered Medications  Medication Dose Route Frequency Provider Last Rate Last Dose  . acetaminophen (TYLENOL) tablet 650 mg  650 mg Oral Q6H PRN Deneise LeverSaraiya, Parth, MD       Or  .  acetaminophen (TYLENOL) suppository 650 mg  650 mg Rectal Q6H PRN Deneise LeverSaraiya, Parth, MD      . feeding supplement (ENSURE ENLIVE) (ENSURE ENLIVE) liquid 237 mL  237 mL Oral BID BM Deneise LeverSaraiya, Parth, MD      . ferumoxytol [place](FERAHEME) 510 mg in sodium chloride 0.9 % 100 mL IVPB  510 mg Intravenous Once Chundi, Vahini, MD      . iron polysaccharides (NIFEREX) capsule 150 mg  150 mg Oral Daily Deneise LeverSaraiya, Parth, MD   150 mg at 03/29/17 2035  . pantoprazole (PROTONIX) injection 40 mg  40 mg Intravenous Q12H Deneise LeverSaraiya, Parth, MD   40 mg at 03/30/17 0950  . promethazine (PHENERGAN) tablet 12.5 mg  12.5 mg Oral Q6H PRN Deneise LeverSaraiya, Parth, MD        Allergies as of 03/29/2017  . (No Known Allergies)    History reviewed. No pertinent family history.  Social History   Socioeconomic History  . Marital status: Single    Spouse name: Not on file  . Number of children: Not on file  . Years of education: Not on file  . Highest education level: Not on file  Social Needs  . Financial resource strain: Not on file  . Food insecurity - worry: Not on file  . Food insecurity - inability: Not on file  . Transportation needs - medical: Not on file  . Transportation needs - non-medical: Not on file  Occupational History  . Not on file  Tobacco Use  . Smoking status: Never Smoker  . Smokeless tobacco: Never Used  Substance and Sexual Activity  . Alcohol use: No    Alcohol/week: 0.0 oz  . Drug use: No  . Sexual activity: Not on file  Other Topics Concern  . Not on file  Social History Narrative  . Not on file    Review of Systems: As per HPI, all others negative  Physical Exam: Vital signs in last 24 hours: Temp:  [98 F (36.7 C)-99.3 F (37.4 C)] 98 F (36.7 C) (11/09 1000) Pulse Rate:  [81-96] 87 (11/09 1000) Resp:  [14-21] 16 (11/09 1000) BP: (132-187)/(58-91) 175/77 (11/09 1000) SpO2:  [95 %-100 %] 98 % (11/09 1000) Weight:  [105 lb (47.6 kg)] 105 lb (47.6 kg) (11/08 2058)   General:   Alert,spry,  younger-appearing than stated age, NAD Head:  Normocephalic and atraumatic. Eyes:  Sclera clear, no icterus.   Conjunctiva pink. Ears:  Normal auditory acuity. Nose:  No deformity, discharge,  or lesions. Mouth:  No deformity or lesions.  Oropharynx pink & moist. Neck:  Supple; no masses or thyromegaly. Abdomen:  Soft, nontender and nondistended. No masses, hepatosplenomegaly or hernias noted. Normal bowel sounds, without guarding, and without rebound.     Msk:  Symmetrical without gross deformities. Normal posture. Pulses:  Normal pulses noted. Extremities:  Without clubbing or edema. Neurologic:  Alert and  oriented x4;  grossly normal neurologically. Skin:  Intact without significant lesions or rashes. Psych:  Alert and cooperative. Normal mood and affect.   Lab Results: Recent Labs    03/29/17 2204 03/30/17 0436 03/30/17 1036  WBC 5.3 4.5 4.4  HGB 8.1* 8.2* 9.0*  HCT 26.5* 26.9* 29.9*  PLT 205 217 216   BMET Recent Labs    03/29/17 1235 03/30/17 0436  NA 138 137  K 4.1 4.1  CL 101 104  CO2 29 27  GLUCOSE 169* 90  BUN 20 14  CREATININE 1.02* 0.88  CALCIUM 9.6 8.9   LFT Recent Labs    03/30/17 0436  PROT 6.5  ALBUMIN 3.1*  AST 17  ALT 12*  ALKPHOS 42  BILITOT 0.9   PT/INR Recent Labs    03/29/17 1235  LABPROT 13.6  INR 1.05    Studies/Results: Dg Chest Portable 1 View  Result Date: 03/29/2017 CLINICAL DATA:  Recent fall, pain in the right shoulder, shortness of breath EXAM: PORTABLE CHEST 1 VIEW COMPARISON:  Chest x-ray of 09/22/2015 FINDINGS: The lungs appear somewhat hyperaerated. No pneumonia or effusion is seen. Mediastinal and hilar contours are unremarkable. The heart is moderately enlarged and stable. There may be a hiatal hernia present. There is some deviation of the superior tracheal air shadow to the right of midline. This could indicate prominence of the left lobe of thyroid. Clinical correlation is recommended. IMPRESSION: 1. No active  lung disease.  Slight hyper aeration. 2. Stable moderate cardiomegaly. 3. Slight deviation of superior tracheal air shadow to the right. Question enlargement of the left lobe of thyroid. Electronically Signed   By: Dwyane DeePaul  Barry M.D.   On: 03/29/2017 14:05   Dg Shoulder Right Portable  Result Date: 03/29/2017 CLINICAL DATA:  Recent fall. Pain right shoulder. Shortness of breath. Symptoms for 2 days. EXAM: PORTABLE RIGHT SHOULDER COMPARISON:  03/29/2017 FINDINGS: No acute fracture or subluxation. There are degenerative changes in the acromioclavicular joint. Subacromial narrowing and humeral head osteophytes are present. IMPRESSION: Chronic changes in the right shoulder. No evidence for acute abnormality. Electronically  Signed   By: Norva Pavlov M.D.   On: 03/29/2017 14:14   Impression:  1.  Blood in stool, resolved.  Colonoscopy last year, showing diverticulosis. 2.  Anemia.  Plan:  1.  Advance diet.  Follow CBCs.  Gentle hydration. 2.  As bleeding appears to have stopped, and given patient's age and comorbidities, would not purse endoscopic evaluation at the present time.   3.  Might consider CT enterography as outpatient to exclude obvious mass-like lesion in GI tract proximal to colon. 4.  [facility] will follow; if looking ok, might consider discharge home from GI perspective with outpatient follow-up with Dr. Randa Evens, patient's primary gastroenterologist.   LOS: 0 days   Freddy Jaksch  03/30/2017, 1:25 PM  Cell [phone] If no answer or after 5 PM call [phone]

## 2017-03-30 NOTE — Progress Notes (Signed)
Initial Nutrition Assessment  DOCUMENTATION CODES:   Severe malnutrition in context of chronic illness  INTERVENTION:   - Continue Ensure Enlive BID; each supplement provides 350 kcals and 20 grams of protein  NUTRITION DIAGNOSIS:   Severe Malnutrition related to chronic illness(recurrent GI bleed, anemia, HTN, DM) as evidenced by severe muscle depletion, severe fat depletion, percent weight loss  GOAL:   Patient will meet greater than or equal to 90% of their needs  MONITOR:   PO intake, Supplement acceptance, Labs, Diet advancement, Weight trends  REASON FOR ASSESSMENT:   Consult Assessment of nutrition requirement/status  ASSESSMENT:   Pt is a 83 year old female with a PMH of hypertension, DM, baseline dementia, chronic constipation, and a prior lower GI bleed. Pt presented for another GI bleed and worsening anemia.   Pt was advanced from NPO to full liquids today. Pt reports having a good appetite PTA. She typically eats sausage, eggs, and a biscuit for breakfast. She does not eat lunch. For dinner she has ham, black eyed peas, and green beans for dinner. She reports snacking on cookies in the afternoon.  Pt reports losing 20 lbs in the past 1-2 months and a UBW of 125-130 lbs. Per weight records, pt weighed 105 lbs on 11/8, 114 lbs on 8/9, and 120 lbs on 7/3. This indicates a 12.5% weight loss in 4 months, severe for time frame.   Medications and labs reviewed.  NUTRITION - FOCUSED PHYSICAL EXAM:    Most Recent Value  Orbital Region  Severe depletion  Upper Arm Region  Severe depletion  Thoracic and Lumbar Region  Moderate depletion  Buccal Region  Mild depletion  Temple Region  Moderate depletion  Clavicle Bone Region  Severe depletion  Clavicle and Acromion Bone Region  Severe depletion  Scapular Bone Region  Moderate depletion  Dorsal Hand  Moderate depletion  Patellar Region  Severe depletion  Anterior Thigh Region  Severe depletion  Posterior Calf Region   Severe depletion  Edema (RD Assessment)  None  Hair  Reviewed  Eyes  Reviewed  Mouth  Reviewed  Skin  Reviewed  Nails  Reviewed       Diet Order:  Diet - low sodium heart healthy Diet Heart Room service appropriate? Yes; Fluid consistency: Thin  EDUCATION NEEDS:   No education needs have been identified at this time  Skin:  Skin Assessment: Reviewed RN Assessment  Last BM:  PTA  Height:   Ht Readings from Last 1 Encounters:  03/30/17 5\' 2"  (1.575 m)    Weight:   Wt Readings from Last 1 Encounters:  03/29/17 105 lb (47.6 kg)    Ideal Body Weight:  50 kg  BMI:  Body mass index is 19.2 kg/m.  Estimated Nutritional Needs:   Kcal:  1300-1500 kcals  Protein:  65-75 grams  Fluid:  1.3-1.5 L    Wynetta EmeryGrace Herman [facility] Dietetic Intern Pager: [phone] 03/30/2017 2:00 PM

## 2017-03-30 NOTE — Discharge Instructions (Signed)
It was a pleasure to take care of you Sharon Conway. During this hosptialization you were managed for a gastrointestinal bleed. [facility] and your primary care physician in 1 week. Thank you.  Best,  Lorenso CourierVahini Shon Mansouri, MD Internal Medicine PGY1   Gastrointestinal Bleeding Gastrointestinal bleeding is bleeding somewhere along the path food travels through the body (digestive tract). This path is anywhere between the mouth and the opening of the butt (anus). You may have blood in your poop (stools) or have black poop. If you throw up (vomit), there may be blood in it. This condition can be mild, serious, or even life-threatening. If you have a lot of bleeding, you may need to stay in the hospital. Follow these instructions at home:  Take over-the-counter and prescription medicines only as told by your doctor.  Eat foods that have a lot of fiber in them. These foods include whole grains, fruits, and vegetables. You can also try eating 1-3 prunes each day.  Drink enough fluid to keep your pee (urine) clear or pale yellow.  Keep all follow-up visits as told by your doctor. This is important. Contact a doctor if:  Your symptoms do not get better. Get help right away if:  Your bleeding gets worse.  You feel dizzy or you pass out (faint).  You feel weak.  You have very bad cramps in your back or belly (abdomen).  You pass large clumps of blood (clots) in your poop.  Your symptoms are getting worse. This information is not intended to replace advice given to you by your health care provider. Make sure you discuss any questions you have with your health care provider. Document Released: 02/15/2008 Document Revised: 10/14/2015 Document Reviewed: 10/26/2014 Elsevier Interactive Patient Education  2018 [employer]

## 2017-03-30 NOTE — Discharge Summary (Signed)
Name: Sharon Conway MRN: [id number] DOB: [birth year] 83 y.o. PCP: Noemi ChapelMolt, Bethany, DO  Date of Admission: 03/29/2017 12:55 PM Date of Discharge: 03/30/2017 Attending Physician: Dr. Earl LagosNischal Narendra  Discharge Diagnosis:  Gastrointestinal bleed Iron deficiency anemia  Discharge Medications: Allergies as of 03/30/2017   No Known Allergies     Medication List    TAKE these medications   feeding supplement (ENSURE ENLIVE) Liqd Take 237 mLs by mouth 2 (two) times daily between meals.   iron polysaccharides 150 MG capsule Commonly known as:  NIFEREX Take 1 capsule (150 mg total) by mouth daily.   metFORMIN 500 MG 24 hr tablet Commonly known as:  GLUCOPHAGE XR Take 1 tablet (500 mg total) by mouth daily with breakfast.   pantoprazole 40 MG tablet Commonly known as:  PROTONIX Take 1 tablet (40 mg total) 2 (two) times daily before a meal by mouth.   rOPINIRole 2 MG tablet Commonly known as:  REQUIP Take 2 mg by mouth at bedtime.   senna-docusate 8.6-50 MG tablet Commonly known as:  Senokot-S Take 1 tablet by mouth 2 (two) times daily. What changed:    when to take this  reasons to take this   solifenacin 5 MG tablet Commonly known as:  VESICARE Take 1 tablet (5 mg total) by mouth daily.       Disposition and follow-up:   [name] was [facility].  At the hospital follow up visit please address:  1.  Gastrointestinal bleeding: Follow-up with Dr. Randa EvensEdwards, consider CT enterography as outpatient.  Repeat CBC, asked patient whether she has noticed any dark stools or active bleeding.  Iron deficiency anemia: Recommend iron supplementation outpatient  2.  Labs / imaging needed at time of follow-up: None  3.  Pending labs/ test needing follow-up: None  Follow-up Appointments: Follow-up Information    Carman ChingEdwards, James, MD. Schedule an appointment as soon as possible for a visit in 2 week(s).   Specialty:   Gastroenterology Contact information: [address] [place] [address] [phone]        [facility]. Go on 04/17/2017.   Why:  Appointment at 2:45 pm. Contact information: [address] [place] [id number] [phone]          Hospital Course by problem list:  Gastrointestinal bleed Patient presented [facility] following a finding of hemoglobin = 5.3 on her routine blood work done [place].  On admission, the patient denied any abdominal pain, nausea, lightheadedness, headaches, blurry vision, chest pain.  Patient was given 2 units of PRBCs.  Posttransfusion hemoglobin level was 8.1.  The patient has a history of lower GI bleeding.  On previous colonoscopy she was found to have severe diverticulosis, but no diverticular bleeding.  The patient was seen by gastroenterology and they deemed endoscopic evaluation not necessary at this time as the patient's bleeding appears to have stopped.  GI would like to consider CT enterography outpatient to exclude any masslike lesions in the GI tract proximal to the colon.  She was given Feraheme prior to discharge.  The patient's hemoglobin at discharge was 8.2.  Iron deficiency anemia: The patient's MCV was 79.6, RDW = 16.8, hemoglobin = 8.2, hematocrit = 26.9 Ferritin= 6.  The patient's laboratory values are significant for iron deficiency anemia.  Patient was given Feraheme prior to discharge.  She is recommended to get iron supplementation outpatient.  Discharge Vitals:  BP (!) 175/77 (BP Location: Left Arm)   Pulse 87   Temp 98 F (36.7 C) (Oral)   Resp 16   Ht 5\' 2"  (1.575 m)   Wt 105 lb (47.6 kg)   SpO2 98%   BMI 19.20 kg/m   Pertinent Labs, Studies, and Procedures:   CBC    Component Value Date/Time   WBC 4.4 03/30/2017 1036   RBC 3.74 (L) 03/30/2017 1036   HGB 9.0 (L) 03/30/2017 1036   HCT 29.9 (L) 03/30/2017 1036   HCT 29.2 (L) 10/28/2015 1630    PLT 216 03/30/2017 1036   MCV 79.9 03/30/2017 1036   MCH 24.1 (L) 03/30/2017 1036   MCHC 30.1 03/30/2017 1036   RDW 16.4 (H) 03/30/2017 1036   LYMPHSABS 0.8 03/29/2017 1235   MONOABS 0.3 03/29/2017 1235   EOSABS 0.1 03/29/2017 1235   BASOSABS 0.0 03/29/2017 1235   BMP BMP Latest Ref Rng & Units 03/30/2017 03/29/2017 12/22/2016  Glucose 65 - 99 mg/dL 90 045(W169(H) 098(J108(H)  BUN 6 - 20 mg/dL 14 20 19   Creatinine 0.44 - 1.00 mg/dL 1.910.88 4.78(G1.02(H) 9.560.86  Sodium 135 - 145 mmol/L 137 138 139  Potassium 3.5 - 5.1 mmol/L 4.1 4.1 4.6  Chloride 101 - 111 mmol/L 104 101 106  CO2 22 - 32 mmol/L 27 29 28   Calcium 8.9 - 10.3 mg/dL 8.9 9.6 2.1(H8.7(L)   YQM=57.8MCV=79.6, RDW = 16.8, hemoglobin = 8.2, hematocrit = 26.9 Ferritin= 6  Discharge Instructions: Discharge Instructions    Call MD for:  extreme fatigue   Complete by:  As directed    Call MD for:  persistant dizziness or light-headedness   Complete by:  As directed    Call MD for:  persistant nausea and vomiting   Complete by:  As directed    Diet - low sodium heart healthy   Complete by:  As directed    Discharge instructions   Complete by:  As directed    Discharge after patient has received ferraheme   Increase activity slowly   Complete by:  As directed       Signed: Lorenso CourierVahini Nyliah Nierenberg, MD Internal Medicine PGY1 Pager:[phone] 03/31/2017, 9:30 AM

## 2017-03-30 NOTE — Progress Notes (Signed)
   Subjective: [name] was seen getting out of the bed I am he has walked into the room.  She states that she is doing well.  She denies any nausea, vomiting, abdominal pain.  Patient states that she has shortness of breath daughter notes that it is especially prevalent at nighttime.  The patient was excited to work with the occupational therapist who was in the room when we went to see her.  Objective:  Vital signs in last 24 hours: Vitals:   03/29/17 1950 03/29/17 2058 03/30/17 0425 03/30/17 0700  BP: (!) 168/84 (!) 170/79 (!) 166/71   Pulse: 91 88 81   Resp: 17 15 14    Temp: 98.7 F (37.1 C) 98 F (36.7 C) 98.3 F (36.8 C)   TempSrc: Oral     SpO2: 98% 99% 97%   Weight:  105 lb (47.6 kg)    Height:    5\' 2"  (1.575 m)   Physical Exam  Constitutional: She appears well-developed and well-nourished. No distress.  HENT:  Head: Normocephalic and atraumatic.  Eyes: Conjunctivae are normal.  Cardiovascular: Normal rate, regular rhythm and normal heart sounds.  Pulmonary/Chest: Effort normal and breath sounds normal. No stridor. No respiratory distress.  Abdominal: Soft. Bowel sounds are normal. She exhibits no distension. There is no tenderness.  Neurological: She is alert.  Skin: She is not diaphoretic.  Psychiatric: She has a normal mood and affect. Her behavior is normal. Judgment and thought content normal.   Assessment/Plan:  Gastrointestinal bleed The patient presents with lower GI bleed with a hemoglobin on admission of 5.8.  PRBCs overnight 03/29/2017.  Patient hemoglobin was 8.1.  CBC 03/30/2017: Hemoglobin = 8.2, hematocrit = 26.9 -Gastroenterology consulted will follow today 03/30/2017 -IV pantoprazole 40mg  every 12 hours -Phenergan 12.5 mg every 6 hours as needed -CBC every 6 hours -PT/OT evaluation  Essential hypertension Patient's blood pressure since his admission has ranged 116-187/50-91.  Patient is not on any home medication as previous medication has been  stopped. Not placed on any antihypertensive medications due to the GI bleed.  Diabetes mellitus -SSI  Iron deficiency anemia The patient's MCV was 79.6, RDW = 16.8, hemoglobin = 8.2, hematocrit = 26.9 Ferritin= 6 Patient's laboratory values are significant for iron deficiency anemia.   -The patient is recommended to get iron supplementation as outpatient.  Dispo: Anticipated discharge in approximately  day(s).   Lorenso CourierVahini Kemia Wendel, MD Internal Medicine PGY1 [phone] 03/30/2017, 10:06 AM

## 2017-04-05 ENCOUNTER — Other Ambulatory Visit (HOSPITAL_COMMUNITY): Payer: Self-pay

## 2017-04-05 ENCOUNTER — Encounter: Payer: Self-pay | Admitting: Physician Assistant

## 2017-04-06 ENCOUNTER — Ambulatory Visit (HOSPITAL_COMMUNITY)
Admit: 2017-04-06 | Discharge: 2017-04-06 | Disposition: A | Discharge status: Home / Self Care | Payer: Medicare (Managed Care) | Attending: Family Medicine | Admitting: Family Medicine

## 2017-04-06 DIAGNOSIS — D6489 Other specified anemias: Secondary | ICD-10-CM | POA: Insufficient documentation

## 2017-04-06 MED ORDER — SODIUM CHLORIDE 0.9 % IV SOLN
510.0000 mg | INTRAVENOUS | Status: DC
  Administered 2017-04-06: 10:00:00 510 mg via INTRAVENOUS
  Filled 2017-04-06: qty 17

## 2017-04-06 NOTE — Discharge Instructions (Signed)

## 2017-04-13 ENCOUNTER — Encounter (HOSPITAL_COMMUNITY): Payer: Medicare (Managed Care)

## 2017-04-17 ENCOUNTER — Other Ambulatory Visit: Payer: Self-pay

## 2017-04-17 ENCOUNTER — Ambulatory Visit: Payer: Medicare (Managed Care) | Admitting: Internal Medicine

## 2017-04-17 ENCOUNTER — Encounter: Payer: Self-pay | Admitting: Internal Medicine

## 2017-04-17 VITALS — BP 162/80 | HR 93 | Temp 98.2°F | Ht 62.0 in | Wt 111.2 lb

## 2017-04-17 DIAGNOSIS — K922 Gastrointestinal hemorrhage, unspecified: Secondary | ICD-10-CM

## 2017-04-17 NOTE — Assessment & Plan Note (Signed)
Patient was inappropriately instructed on her discharge paperwork to follow[facility] with [facility]. She is now a [facility] patient who receives medical care through them. See's Dr. Mayford KnifeWilliams now and is very happy with her care. She denied any further blood loss to me and denied any SOB, LH or dizziness. Discussed with patient and family that she now needs to follow with [facility] and apologized for the mix-up.  -Pt to continue following with [facility] throughout the week -NO CHARGE

## 2017-04-17 NOTE — Progress Notes (Signed)
   CC: HFU for GIB  HPI:  [name] is a 83 y.o. F with history of iron-deficiency anemia & acute blood loss anemia, type 2 diabetes and history of 2 prior GIB and severe diverticulosis [facility] showed Hb of 5.3. Noted some BRB on toilet tissue at that time. Transfused 2 units and hematochezia resolved on its own.  She has been following up with [facility] several times a week. She notes she sees Dr. Mayford KnifeWilliams who has drawn several labs after admission. [facility] and looks forward to her visits.   Past Medical History:  Diagnosis Date  . Anemia   . Diabetes mellitus without complication (HCC)    type 2  . Diverticulosis   . GI bleed 09/2015  . Hypertension    Review of Systems:   General: Denies fevers, chills, weight loss, fatigue Cardiac: Denies CP, SOB, palpitations Abd: Denies diarrhea, constipation, changes in bowels  Physical Exam: General: Frail. Alert, in no acute distress. Pleasant and conversant Abd: Soft, non-tended. +bs Extremities: Warm, perfused. No significant pedal edema.   Vitals:   04/17/17 1426  BP: (!) 162/80  Pulse: 93  Temp: 98.2 F (36.8 C)  TempSrc: Oral  SpO2: 98%  Weight: 111 lb 3.2 oz (50.4 kg)  Height: 5\' 2"  (1.575 m)   Assessment & Plan:   See Encounters Tab for problem based charting.  Patient discussed with Dr. Cleda DaubE. Hoffman

## 2017-04-18 NOTE — Progress Notes (Signed)
Internal Medicine Clinic Attending  Case discussed with Dr. Molt at the time of the visit.  We reviewed the resident's history and exam and pertinent patient test results.  I agree with the assessment, diagnosis, and plan of care documented in the resident's note. 

## 2017-04-24 ENCOUNTER — Ambulatory Visit (INDEPENDENT_AMBULATORY_CARE_PROVIDER_SITE_OTHER): Payer: Medicare (Managed Care) | Admitting: Physician Assistant

## 2017-04-24 ENCOUNTER — Encounter: Payer: Self-pay | Admitting: Physician Assistant

## 2017-04-24 VITALS — BP 132/80 | HR 72 | Ht 62.0 in | Wt 111.4 lb

## 2017-04-24 DIAGNOSIS — R195 Other fecal abnormalities: Secondary | ICD-10-CM | POA: Diagnosis not present

## 2017-04-24 DIAGNOSIS — K5731 Diverticulosis of large intestine without perforation or abscess with bleeding: Secondary | ICD-10-CM | POA: Diagnosis not present

## 2017-04-24 DIAGNOSIS — D500 Iron deficiency anemia secondary to blood loss (chronic): Secondary | ICD-10-CM | POA: Diagnosis not present

## 2017-04-24 MED ORDER — PANTOPRAZOLE SODIUM 40 MG PO TBEC: DELAYED_RELEASE_TABLET | ORAL | 3 refills | Status: DC

## 2017-04-24 NOTE — Patient Instructions (Signed)
We have printed you a prescription for Pantoprazole Sodium 40 mg.  You can take Miralax 17 gram- one dose 2-3 times weekly. Can  Stop oral iron supplement after the IV Iron infusion.  We will call you in the next day or 2 . We will set you up for an IV iron infusion, you will need to get blood counts checked every 2 weeks.   Call our office the end of December for an office visit with Mike GipAmy Esterwood PA-C early January 2019. [phone] choose option 2.

## 2017-04-24 NOTE — Progress Notes (Addendum)
Subjective:    Patient ID: Sharon Conway, female    DOB: February 13, 1919, 83 y.o.   MRN: [id number]  HPI [name] is a pleasant 83 year old  African-American female, new to GI today referred by [facility], Dr. Toma Copier Molt DO for evaluation of anemia and recent GI bleed.  Patient was previously known to [facility]/Dr. Randa Evens and had undergone colonoscopy in 2017 at which time she had a GI bleed.  She was found to have diverticulosis. She had a recent admission 03/29/2017 with recurrent acute GI bleeding and was found to have a hemoglobin of 5.8.  She was seen in consultation by Dr. Dulce Sellar.  She was transfused, her bleeding has subsided by the time she was hospitalized and due to age etc. he did not feel that she needed repeat endoscopic evaluation for further GI workup.  He did suggest possible CT enterography to rule out small bowel lesion. The time of discharge from hospital hemoglobin was 9 hematocrit of 29.9. Patient has been taking oral iron since discharge.  Her daughter also reports that she did get one IV iron infusion after discharge from the hospital and was to have a second infusion which was canceled for unclear reasons. Patient is not a great historian and the patient's daughter feels that her mom keeps some of her physical symptoms and issues from her which is made it hard for her to help care for her.  They are currently living in the same home.  Patient's daughter is concerned that she may be passing some blood off and on still because at times she will have very malodorous stools which are dark. Patient denies any problems with abdominal discomfort, her appetite has been fine she has not had any nausea or vomiting, she denies any dysphagia or odynophagia.  She has had some problems with constipation and they have been using MiraLAX as needed.  Patient denies passing any blood, and admits that she has seen dark stools but she did not equate that with any bleeding.  She is also again been on oral  iron. Patient has history of somewhat chronic anemia with hemoglobin of 10.6 in 2016 MCV of 86 and ferritin was 14 about a year ago. Other medical problems include hypertension and adult onset diabetes mellitus.  Review of Systems Pertinent positive and negative review of systems were noted in the above HPI section.  All other review of systems was otherwise negative.  Outpatient Encounter Medications as of 04/24/2017  Medication Sig  . feeding supplement, ENSURE ENLIVE, (ENSURE ENLIVE) LIQD Take 237 mLs by mouth 2 (two) times daily between meals.  . iron polysaccharides (NIFEREX) 150 MG capsule Take 1 capsule (150 mg total) by mouth daily.  . metFORMIN (GLUCOPHAGE XR) 500 MG 24 hr tablet Take 1 tablet (500 mg total) by mouth daily with breakfast.  . pantoprazole (PROTONIX) 40 MG tablet Take 1 tablet by mouth every morning.  Marland Kitchen rOPINIRole (REQUIP) 2 MG tablet Take 2 mg by mouth at bedtime.  . senna-docusate (SENOKOT-S) 8.6-50 MG tablet Take 1 tablet by mouth 2 (two) times daily. (Patient taking differently: Take 1 tablet by mouth 2 (two) times daily as needed for mild constipation. )  . solifenacin (VESICARE) 5 MG tablet Take 1 tablet (5 mg total) by mouth daily.  . [DISCONTINUED] pantoprazole (PROTONIX) 40 MG tablet Take 1 tablet (40 mg total) 2 (two) times daily before a meal by mouth.   No facility-administered encounter medications on file as of 04/24/2017.    No  Known Allergies Patient Active Problem List   Diagnosis Date Noted  . GI bleed 03/29/2017  . Lower GI bleed 12/21/2016  . Diverticulosis of colon with hemorrhage   . Hypertension 11/21/2016  . Chronic anemia 10/28/2015  . Malnutrition of moderate degree 10/01/2015  . Gastrointestinal hemorrhage associated with intestinal diverticulitis   . Diabetes mellitus type II, controlled (HCC) 09/22/2015   Social History   Socioeconomic History  . Marital status: Single    Spouse name: Not on file  . Number of children: Not on file   . Years of education: Not on file  . Highest education level: Not on file  Social Needs  . Financial resource strain: Not on file  . Food insecurity - worry: Not on file  . Food insecurity - inability: Not on file  . Transportation needs - medical: Not on file  . Transportation needs - non-medical: Not on file  Occupational History  . Not on file  Tobacco Use  . Smoking status: Never Smoker  . Smokeless tobacco: Never Used  Substance and Sexual Activity  . Alcohol use: No    Alcohol/week: 0.0 oz  . Drug use: No  . Sexual activity: Not on file  Other Topics Concern  . Not on file  Social History Narrative  . Not on file    Ms. Mule's family history is not on file.      Objective:    Vitals:   04/24/17 1044  BP: 132/80  Pulse: 72    Physical Exam well-developed very elderly African-American female in no acute distress, accompanied by her daughter.  Blood pressure 132/80, pulse 72, height 5 foot 2, weight 111, BMI 20.38.  HEENT; nontraumatic normocephalic EOMI PERRLA sclera anicteric, Cardiovascular; regular rate and rhythm with S1-S2 there is a soft systolic murmur, Pulmonary; clear bilaterally, Abdomen; soft, nontender nondistended bowel sounds are active there is no palpable mass or hepatosplenomegaly, Rectal ;exam dark brown stool which is heme positive there is a lot of semi-formed stool in the rectal vault, Extremities; no clubbing cyanosis or edema skin warm dry, Neuro psych; mood and affect appropriate       Assessment & Plan:   #731 83 year old African-American female with iron deficiency anemia, persistent heme positive stool and recent hospitalization with more overt GI bleeding and hemoglobin of 5.8. This was attributed to diverticular bleeding as she had had prior history of diverticular bleeding in 2017 and had colonoscopy at that time. Patient may have another source of ongoing chronic GI blood loss, rule out AVMs, occult gastric or small bowel lesion. #2.   Hypertension #3.  Adult onset diabetes mellitus  Plan; Patient's daughter is comfortable managing care less aggressively at present with serial hemoglobins, transfusions as needed and additional IV iron. We discussed potential need for upper endoscopy or small bowel evaluation should she continue to have evidence of blood loss and recurrent severe anemia.  She does not want her to have to undergo procedures unless absolutely necessary. We will continue Protonix 40 mg p.o. every morning We will arrange for IV Feraheme x1 Discontinue oral iron after completing IV iron infusions. Patient will need to have a CBC done at least every 2 weeks and this can be done through [facility], with plans for outpatient transfusions as needed. Patient's daughter states that her mom very much dislikes going to the doctor and can be somewhat distrustful, also very much wants to avoid hospitalizations.  I suggested they may want to speak with her primary care physician  regarding goals of care. I will plan to see her in follow-up in 1 month.,  Patient will be established with Dr. Emelia LoronPyrtle  Naasir Carreira S Yanci Bachtell PA-C 04/24/2017   Addendum: Reviewed and agree with initial management. Pyrtle, Carie CaddyJay M, MD     Cc: Molt, Bethany, DO

## 2017-04-25 ENCOUNTER — Other Ambulatory Visit: Payer: Self-pay

## 2017-04-25 ENCOUNTER — Telehealth: Payer: Self-pay

## 2017-04-25 NOTE — Telephone Encounter (Signed)
-----   Message from Sammuel CooperAmy S Esterwood, PA-C sent at 04/24/2017  5:01 PM EST ----- Regarding: IV iron Beth, I saw this pt today in office - Fe deficiency anemia- she had one IV Ferehems infusion about 2 weeks ago - please arrange for her to have one more - [facility] - call daughter with date etc thanks

## 2017-04-25 NOTE — Telephone Encounter (Signed)
Scheduled for 05/02/17 at 10:00 am. Message left on the daughter's voicemail.

## 2017-04-26 NOTE — Telephone Encounter (Signed)
Left the information about the infusion appointment date with Sonja at [facility]. 05/02/17 at 10[facility]00 am [facility].

## 2017-05-02 ENCOUNTER — Inpatient Hospital Stay (HOSPITAL_COMMUNITY): Admission: RE | Admit: 2017-05-02 | Payer: Medicare (Managed Care) | Source: Ambulatory Visit

## 2017-05-09 ENCOUNTER — Other Ambulatory Visit (HOSPITAL_COMMUNITY): Payer: Self-pay | Admitting: *Deleted

## 2017-05-10 ENCOUNTER — Inpatient Hospital Stay (HOSPITAL_COMMUNITY): Admission: RE | Admit: 2017-05-10 | Payer: Medicare (Managed Care) | Source: Ambulatory Visit

## 2017-05-18 ENCOUNTER — Ambulatory Visit (HOSPITAL_COMMUNITY)
Admission: RE | Admit: 2017-05-18 | Discharge: 2017-05-18 | Disposition: A | Discharge status: Home / Self Care | Payer: Medicare (Managed Care) | Source: Ambulatory Visit | Attending: Family Medicine | Admitting: Family Medicine

## 2017-05-18 DIAGNOSIS — D6489 Other specified anemias: Secondary | ICD-10-CM | POA: Diagnosis not present

## 2017-05-18 MED ORDER — FERUMOXYTOL INJECTION 510 MG/17 ML
510.0000 mg | INTRAVENOUS | Status: AC
  Administered 2017-05-18: 510 mg via INTRAVENOUS
  Filled 2017-05-18: qty 17

## 2017-11-03 IMAGING — NM NM GI BLOOD LOSS
1 series · 6 of 6 positions shown · non-contrast
Comparison: None.

CLINICAL DATA: Active GI bleed.

EXAM:
NUCLEAR MEDICINE GASTROINTESTINAL BLEEDING SCAN
TECHNIQUE: Sequential abdominal images were obtained following intravenous
administration of Fc-DDm labeled red blood cells.
RADIOPHARMACEUTICALS:  25.0 mCi Fc-DDm in-vitro labeled red cells.

[Series 1: gi bleed · 4.14mm/px · 6 of 40 frames shown]
[frame 4/40]
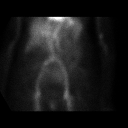
[frame 10/40]
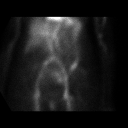
[frame 17/40]
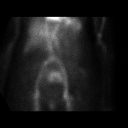
[frame 24/40]
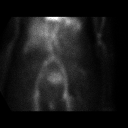
[frame 30/40]
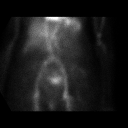
[frame 37/40]
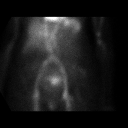

[6 of 6 positions shown; findings below may reference images not displayed]

FINDINGS: There is a tubular focus of increased uptake within the proximal
left colon which increases in intensity and exhibits antegrade
progression conforming to the expected configuration distal colon.
Normal physiologic activity is identified within the liver, GU tract
and blood pool.
IMPRESSION: 1. Examination is positive for active GI bleed which appears to
originate at the level of the splenic flexure.

## 2019-05-11 IMAGING — DX DG SHOULDER 2+V PORT*R*
1 series · 1 of 1 positions shown · non-contrast
Comparison: 03/29/2017

CLINICAL DATA: Recent fall. Pain right shoulder. Shortness of
breath. Symptoms for 2 days.

EXAM:
PORTABLE RIGHT SHOULDER

[shoulder ap]
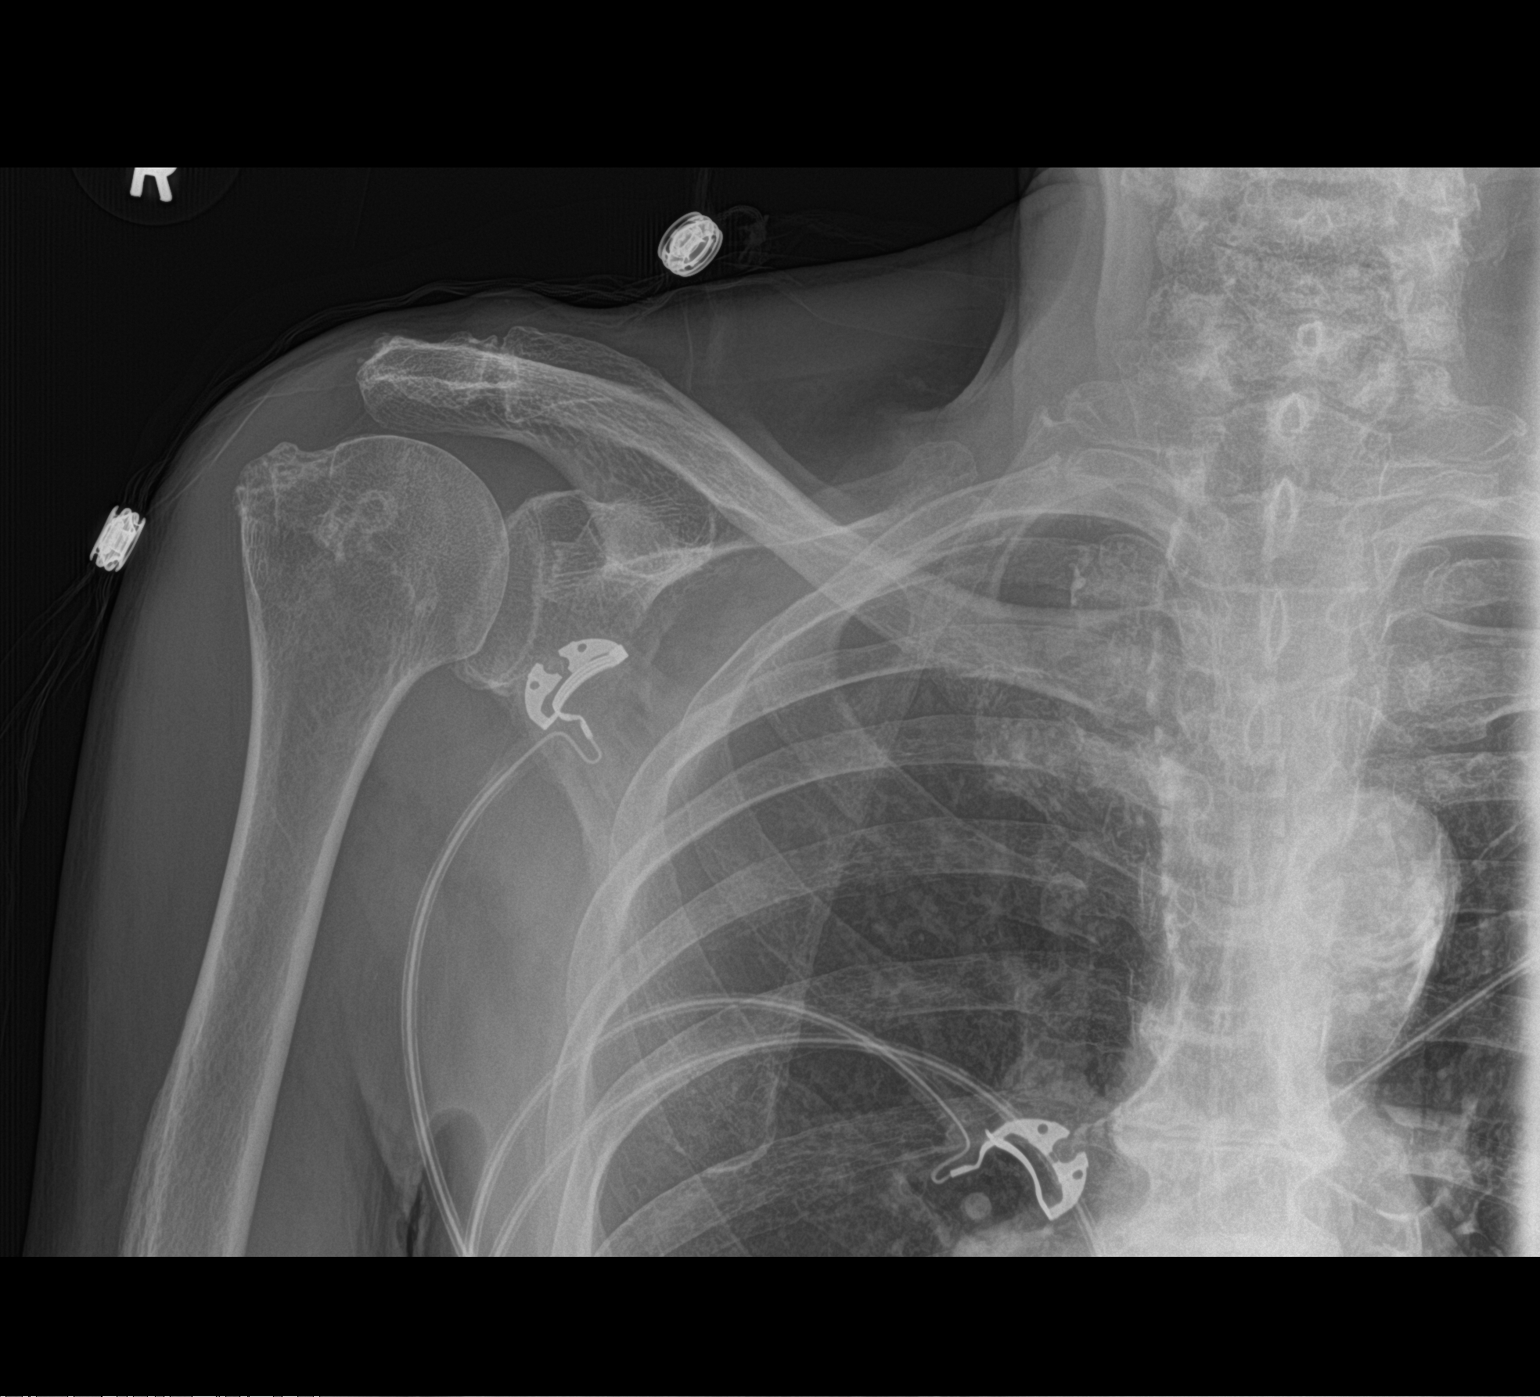

[1 of 1 positions shown; findings below may reference images not displayed]

FINDINGS: No acute fracture or subluxation. There are degenerative changes in
the acromioclavicular joint. Subacromial narrowing and humeral head
osteophytes are present.
IMPRESSION: Chronic changes in the right shoulder. No evidence for acute
abnormality.

## 2020-03-04 ENCOUNTER — Other Ambulatory Visit: Payer: Self-pay | Admitting: Internal Medicine

## 2020-03-04 DIAGNOSIS — E041 Nontoxic single thyroid nodule: Secondary | ICD-10-CM

## 2020-03-11 ENCOUNTER — Ambulatory Visit
Admission: RE | Admit: 2020-03-11 | Discharge: 2020-03-11 | Disposition: A | Discharge status: Home / Self Care | Payer: No Typology Code available for payment source | Source: Ambulatory Visit | Attending: Internal Medicine | Admitting: Internal Medicine

## 2020-03-11 DIAGNOSIS — E041 Nontoxic single thyroid nodule: Secondary | ICD-10-CM

## 2020-07-07 ENCOUNTER — Telehealth: Payer: Self-pay

## 2020-07-07 NOTE — Telephone Encounter (Signed)
I connected by phone with Franco Nones and/or patient's caregiver on 07/07/2020 at 11:58 AM to discuss the potential vaccination through our Homebound vaccination initiative.   Prevaccination Checklist for COVID-19 Vaccines  1.  Are you feeling sick today? no  2.  Have you ever received a dose of a COVID-19 vaccine?  yes      If yes, which one? Pfizer   How many dose of Covid-19 vaccine have your received and dates ? 1, 05/28/2020   Check all that apply: I live in a long-term care setting. no  I have been diagnosed with a medical condition(s). Please list: 87 y.o. female who does not get out (pertinent to homebound status)  I am a first responder. no  I work in a long-term care facility, correctional facility, hospital, restaurant, retail setting, school, or other setting with high exposure to the public. no  4. Do you have a health condition or are you undergoing treatment that makes you moderately or severely immunocompromised? (This would include treatment for cancer or HIV, receipt of organ transplant, immunosuppressive therapy or high-dose corticosteroids, CAR-T-cell therapy, hematopoietic cell transplant [HCT], DiGeorge syndrome or Wiskott-Aldrich syndrome)  no  5. Have you received hematopoietic cell transplant (HCT) or CAR-T-cell therapies since receiving COVID-19 vaccine? no  6.  Have you ever had an allergic reaction: (This would include a severe reaction [ e.g., anaphylaxis] that required treatment with epinephrine or EpiPen or that caused you to go to the hospital.  It would also include an allergic reaction that occurred within 4 hours that caused hives, swelling, or respiratory distress, including wheezing.) A.  A previous dose of COVID-19 vaccine. no  B.  A vaccine or injectable therapy that contains multiple components, one of which is a COVID-19 vaccine component, but it is not known which component elicited the immediate reaction. no  C.  Are you allergic to polyethylene glycol?  no  D. Are you allergic to Polysorbate, which is found in some vaccines, film coated tablets and intravenous steroids?  no   7.  Have you ever had an allergic reaction to another vaccine (other than COVID-19 vaccine) or an injectable medication? (This would include a severe reaction [ e.g., anaphylaxis] that required treatment with epinephrine or EpiPen or that caused you to go to the hospital.  It would also include an allergic reaction that occurred within 4 hours that caused hives, swelling, or respiratory distress, including wheezing.)  no   8.  Have you ever had a severe allergic reaction (e.g., anaphylaxis) to something other than a component of the COVID-19 vaccine, or any vaccine or injectable medication?  This would include food, pet, venom, environmental, or oral medication allergies.  no   Check all that apply to you:  Am a female between ages 12 and 36 years old  no  Women 70 through 85 years of age can receive any FDA-authorized or -approved COVID-19 vaccine. However, they should be informed of the rare but increased risk of thrombosis with thrombocytopenia syndrome (TTS) after receipt of the [employer] Vaccine and the availability of other FDA-authorized and -approved COVID-19 vaccines. People who had TTS after a first dose of Janssen vaccine should not receive a subsequent dose of Janssen product    Am a female between ages 66 and 4 years old  no Males 5 through 85 years of age may receive the correct formulation of Pfizer-BioNTech COVID-19 vaccine. Males 18 and older can receive any FDA-authorized or -approved vaccine. However, people receiving an  mRNA COVID-19 vaccine, especially males 12 through 85 years of age and their parents/legal representative (when relevant), should be informed of the risk of developing myocarditis (an inflammation of the heart muscle) or pericarditis (inflammation of the lining around the heart) after receipt of an mRNA vaccine. The risk of developing  either myocarditis or pericarditis after vaccination is low, and lower than the risk of myocarditis associated with SARS-CoV-2 infection in adolescents and adults. Vaccine recipients should be counseled about the need to seek care if symptoms of myocarditis or pericarditis develop after vaccination     Have a history of myocarditis or pericarditis  no Myocarditis or pericarditis after receipt of the first dose of an mRNA COVID-19 vaccine series but before administration of the second dose  Experts advise that people who develop myocarditis or pericarditis after a dose of an mRNA COVID-19 vaccine not receive a subsequent dose of any COVID-19 vaccine, until additional safety data are available.  Administration of a subsequent dose of COVID-19 vaccine before safety data are available can be considered in certain circumstances after the episode of myocarditis or pericarditis has completely resolved. Until additional data are available, some experts recommend a [employer] COVID-19 vaccine be considered instead of an mRNA COVID-19 vaccine. Decisions about proceeding with a subsequent dose should include a conversation between the patient, their parent/legal representative (when relevant), and their clinical team, which may include a cardiologist.    Have been treated with monoclonal antibodies or convalescent serum to prevent or treat COVID-19  no Vaccination should be offered to people regardless of history of prior symptomatic or asymptomatic SARS-CoV-2 infection. There is no recommended minimal interval between infection and vaccination.  However, vaccination should be deferred if a patient received monoclonal antibodies or convalescent serum as treatment for COVID-19 or for post-exposure prophylaxis. This is a precautionary measure until additional information becomes available, to avoid interference of the antibody treatment with vaccine-induced immune responses.  Defer COVID-19 vaccination for 30 days when a  passive antibody product was used for post-exposure prophylaxis.  Defer COVID-19 vaccination for 90 days when a passive antibody product was used to treat COVID-19.     Diagnosed with Multisystem Inflammatory Syndrome (MIS-C or MIS-A) after a COVID-19 infection  no It is unknown if people with a history of MIS-C or MIS-A are at risk for a dysregulated immune response to COVID-19 vaccination.  People with a history of MIS-C or MIS-A may choose to be vaccinated. Considerations for vaccination may include:   Clinical recovery from MIS-C or MIS-A, including return to normal cardiac function   Personal risk of severe acute COVID-19 (e.g., age, underlying conditions)   High or substantial community transmission of SARS-CoV-2 and personal increased risk of reinfection.   Timing of any immunomodulatory therapies (general best practice guidelines for immunization can be consulted for more information [employer])   It has been 90 days or more since their diagnosis of MIS-C   Onset of MIS-C occurred before any COVID-19 vaccination   A conversation between the patient, their guardian(s), and their clinical team or a specialist may assist with COVID-19 vaccination decisions. Healthcare providers and health departments may also request a consultation from the Clinical Immunization Safety Assessment Project at Ordinary[employer]it vaccinesafety/ensuringsafety/monitoring/cisa/index.html.     Have a bleeding disorder  no Take a blood thinner  no As with all vaccines, any COVID-19 vaccine product may be given to these patients, if a physician familiar with the patient's bleeding risk determines that the vaccine can be administered intramuscularly with  reasonable safety.  ACIP recommends the following technique for intramuscular vaccination in patients with bleeding disorders or taking blood thinners: a fine-gauge needle (23-gauge or smaller caliber) should be used  for the vaccination, followed by firm pressure on the site, without rubbing, for at least 2 minutes.  People who regularly take aspirin or anticoagulants as part of their routine medications do not need to stop these medications prior to receipt of any COVID-19 vaccine.    Have a history of heparin-induced thrombocytopenia (HIT)  no Although the etiology of TTS associated with the Linwood Dibbles COVID-19 vaccine is unclear, it appears to be similar to another rare immune-mediated syndrome, heparin-induced thrombocytopenia (HIT). People with a history of an episode of an immune-mediated syndrome characterized by thrombosis and thrombocytopenia, such as HIT, should be offered a currently FDA-approved or FDA-authorized mRNA COVID-19 vaccine if it has been ?90 days since their TTS resolved. After 90 days, patients may be vaccinated with any currently FDA-approved or FDA-authorized COVID-19 vaccine, including Janssen COVID-19 Vaccine. However, people who developed TTS after their initial Linwood Dibbles vaccine should not receive a Janssen booster dose.  Experts believe the following factors do not make people more susceptible to TTS after receipt of the [employer]. People with these conditions can be vaccinated with any FDA-authorized or - approved COVID-19 vaccine, including the [employer] COVID-19 Vaccine:   A prior history of venous thromboembolism   Risk factors for venous thromboembolism (e.g., inherited or acquired thrombophilia including Factor V Leiden; prothrombin gene 20210A mutation; antiphospholipid syndrome; protein C, protein S or antithrombin deficiency   A prior history of other types of thromboses not associated with thrombocytopenia   Pregnancy, post-partum status, or receipt of hormonal contraceptives (e.g., combined oral contraceptives, patch, ring)   Additional recipient education materials can be found at [employer]Share.com.br vaccines/safety/JJUpdate.html.    Am currently  pregnant or breastfeeding  no Vaccination is recommended for all people aged 65 years and older, including people that are:   Pregnant   Breastfeeding   Trying to get pregnant now or who might become pregnant in the future   Pregnant, breastfeeding, and post-partum people 56 through 85 years of age should be aware of the rare risk of TTS after receipt of the [employer]bles COVID-19 Vaccine and the availability of other FDA-authorized or -approved COVID-19 vaccines (i.e., mRNA vaccines).    Have received dermal fillers  no FDA-authorized or -approved COVID-19 vaccines can be administered to people who have received injectable dermal fillers who have no contraindications for vaccination.  Infrequently, these people might experience temporary swelling at or near the site of filler injection (usually the face or lips) following administration of a dose of an mRNA COVID-19 vaccine. These people should be advised to contact their healthcare provider if swelling develops at or near the site of dermal filler following vaccination.     Have a history of Guillain-Barr Syndrome (GBS)  no People with a history of GBS can receive any FDA-authorized or -approved COVID-19 vaccine. However, given the possible association between the [employer] and an increased risk of GBS, a patient with a history of GBS and their clinical team should discuss the availability of mRNA vaccines to offer protection against COVID-19. The highest risk has been observed in men aged 50-64 years with symptoms of GBS beginning within 42 days after Linwood Dibbles COVID-19 vaccination.  People who had GBS after receiving Janssen vaccine should be made aware of the option to receive an mRNA COVID-19 vaccine booster at least 2 months (  8 weeks) after the Janssen dose. However, Linwood DibblesJanssen vaccine may be used as a booster, particularly if GBS occurred more than 42 days after vaccination or was related to a non-vaccine factor. Prior to booster  vaccination, a conversation between the patient and their clinical team may assist with decisions about use of a COVID-19 booster dose, including the timing of administration     Postvaccination Observation Times for People without Contraindications to Covid 19 Vaccination.  30 minutes:  People with a history of: A contraindication to another type of COVID-19 vaccine product (i.e., mRNA or viral vector COVID-19 vaccines)   Immediate (within 4 hours of exposure) non-severe allergic reaction to a COVID-19 vaccine or injectable therapies   Anaphylaxis due to any cause   Immediate allergic reaction of any severity to a non-COVID-19 vaccine   15 minutes: All other people  This patient is a 87 y.o. female that meets the FDA criteria to receive homebound vaccination. Patient or parent/caregiver understands they have the option to accept or refuse homebound vaccination.  Patient passed the pre-screening checklist and would like to proceed with homebound vaccination.  Based on questionnaire above, I recommend the patient be observed for 15 minutes.  There are an estimated #1 other household members/caregivers who are also interested in receiving the vaccine.    The patient has been confirmed homebound and eligible for homebound vaccination with the considerations outlined above. I will send the patient's information to our scheduling team who will reach out to schedule the patient and potential caregiver/family members for homebound vaccination.    Skip MayerDeborah G Maximillion Gill 07/07/2020 11:58 AM

## 2020-07-22 ENCOUNTER — Ambulatory Visit: Payer: Medicare (Managed Care) | Attending: Critical Care Medicine

## 2020-07-22 DIAGNOSIS — Z23 Encounter for immunization: Secondary | ICD-10-CM

## 2020-07-22 NOTE — Progress Notes (Signed)
   Covid-19 Vaccination Clinic  Name:  Sharon Conway    MRN: [id number] DOB: [birth year]  07/22/2020  [name] was observed post Covid-19 immunization for 15 minutes without incident. She was provided with Vaccine Information Sheet and instruction to access the V-Safe system.   [name] was instructed to call 911 with any severe reactions post vaccine: Marland Kitchen Difficulty breathing  . Swelling of face and throat  . A fast heartbeat  . A bad rash all over body  . Dizziness and weakness   Immunizations Administered    Name Date Dose VIS Date Route   PFIZER Comrnaty(Gray TOP) Covid-19 Vaccine 07/22/2020  1:54 PM 0.3 mL 04/29/2020 Intramuscular   Manufacturer: [employer], [employer]   Lot: [id number]   NDC: [phone]

## 2021-05-18 ENCOUNTER — Encounter (HOSPITAL_COMMUNITY): Payer: Self-pay | Admitting: Emergency Medicine

## 2021-05-18 ENCOUNTER — Emergency Department (HOSPITAL_COMMUNITY): Payer: Medicare (Managed Care)

## 2021-05-18 ENCOUNTER — Inpatient Hospital Stay (HOSPITAL_COMMUNITY)
Admission: EM | Admit: 2021-05-18 | Discharge: 2021-05-20 | DRG: 177 | Disposition: A | Discharge status: Home / Self Care | Payer: Medicare (Managed Care) | Attending: Internal Medicine | Admitting: Internal Medicine

## 2021-05-18 ENCOUNTER — Other Ambulatory Visit: Payer: Self-pay

## 2021-05-18 DIAGNOSIS — Z66 Do not resuscitate: Secondary | ICD-10-CM | POA: Diagnosis present

## 2021-05-18 DIAGNOSIS — D72819 Decreased white blood cell count, unspecified: Secondary | ICD-10-CM | POA: Diagnosis present

## 2021-05-18 DIAGNOSIS — U071 COVID-19: Principal | ICD-10-CM | POA: Diagnosis present

## 2021-05-18 DIAGNOSIS — R0602 Shortness of breath: Secondary | ICD-10-CM | POA: Diagnosis present

## 2021-05-18 DIAGNOSIS — Z79899 Other long term (current) drug therapy: Secondary | ICD-10-CM

## 2021-05-18 DIAGNOSIS — E119 Type 2 diabetes mellitus without complications: Secondary | ICD-10-CM | POA: Diagnosis present

## 2021-05-18 DIAGNOSIS — D649 Anemia, unspecified: Secondary | ICD-10-CM | POA: Diagnosis present

## 2021-05-18 DIAGNOSIS — K219 Gastro-esophageal reflux disease without esophagitis: Secondary | ICD-10-CM | POA: Diagnosis present

## 2021-05-18 DIAGNOSIS — Z90710 Acquired absence of both cervix and uterus: Secondary | ICD-10-CM

## 2021-05-18 DIAGNOSIS — J9601 Acute respiratory failure with hypoxia: Secondary | ICD-10-CM | POA: Diagnosis present

## 2021-05-18 DIAGNOSIS — Z7984 Long term (current) use of oral hypoglycemic drugs: Secondary | ICD-10-CM | POA: Diagnosis not present

## 2021-05-18 DIAGNOSIS — E0590 Thyrotoxicosis, unspecified without thyrotoxic crisis or storm: Secondary | ICD-10-CM | POA: Diagnosis present

## 2021-05-18 DIAGNOSIS — I10 Essential (primary) hypertension: Secondary | ICD-10-CM | POA: Diagnosis present

## 2021-05-18 LAB — CBC
HCT: 33.6 % — ABNORMAL LOW (ref 36.0–46.0)
Hemoglobin: 10 g/dL — ABNORMAL LOW (ref 12.0–15.0)
MCH: 27.8 pg (ref 26.0–34.0)
MCHC: 29.8 g/dL — ABNORMAL LOW (ref 30.0–36.0)
MCV: 93.3 fL (ref 80.0–100.0)
Platelets: 177 10*3/uL (ref 150–400)
RBC: 3.6 MIL/uL — ABNORMAL LOW (ref 3.87–5.11)
RDW: 12.5 % (ref 11.5–15.5)
WBC: 3.2 10*3/uL — ABNORMAL LOW (ref 4.0–10.5)
nRBC: 0 % (ref 0.0–0.2)

## 2021-05-18 LAB — TRIGLYCERIDES: Triglycerides: 91 mg/dL (ref <150)

## 2021-05-18 LAB — CBC WITH DIFFERENTIAL/PLATELET
Abs Immature Granulocytes: 0.03 10*3/uL (ref 0.00–0.07)
Basophils Absolute: 0 10*3/uL (ref 0.0–0.1)
Basophils Relative: 0 %
Eosinophils Absolute: 0.1 10*3/uL (ref 0.0–0.5)
Eosinophils Relative: 2 %
HCT: 39 % (ref 36.0–46.0)
Hemoglobin: 11.8 g/dL — ABNORMAL LOW (ref 12.0–15.0)
Immature Granulocytes: 1 %
Lymphocytes Relative: 14 %
Lymphs Abs: 0.6 10*3/uL — ABNORMAL LOW (ref 0.7–4.0)
MCH: 28.4 pg (ref 26.0–34.0)
MCHC: 30.3 g/dL (ref 30.0–36.0)
MCV: 93.8 fL (ref 80.0–100.0)
Monocytes Absolute: 0.4 10*3/uL (ref 0.1–1.0)
Monocytes Relative: 9 %
Neutro Abs: 3.5 10*3/uL (ref 1.7–7.7)
Neutrophils Relative %: 74 %
Platelets: UNDETERMINED 10*3/uL (ref 150–400)
RBC: 4.16 MIL/uL (ref 3.87–5.11)
RDW: 12.6 % (ref 11.5–15.5)
WBC: 4.7 10*3/uL (ref 4.0–10.5)
nRBC: 0.6 % — ABNORMAL HIGH (ref 0.0–0.2)

## 2021-05-18 LAB — D-DIMER, QUANTITATIVE: D-Dimer, Quant: 1.8 ug/mL-FEU — ABNORMAL HIGH (ref 0.00–0.50)

## 2021-05-18 LAB — COMPREHENSIVE METABOLIC PANEL
ALT: 27 U/L (ref 0–44)
AST: 39 U/L (ref 15–41)
Albumin: 3.8 g/dL (ref 3.5–5.0)
Alkaline Phosphatase: 59 U/L (ref 38–126)
Anion gap: 10 (ref 5–15)
BUN: 28 mg/dL — ABNORMAL HIGH (ref 8–23)
CO2: 28 mmol/L (ref 22–32)
Calcium: 9.3 mg/dL (ref 8.9–10.3)
Chloride: 100 mmol/L (ref 98–111)
Creatinine, Ser: 0.86 mg/dL (ref 0.44–1.00)
GFR, Estimated: >60 mL/min (ref >60)
Glucose, Bld: 182 mg/dL — ABNORMAL HIGH (ref 70–99)
Potassium: 5 mmol/L (ref 3.5–5.1)
Sodium: 138 mmol/L (ref 135–145)
Total Bilirubin: 0.6 mg/dL (ref 0.3–1.2)
Total Protein: 7.8 g/dL (ref 6.5–8.1)

## 2021-05-18 LAB — FERRITIN: Ferritin: 61 ng/mL (ref 11–307)

## 2021-05-18 LAB — LACTIC ACID, PLASMA
Lactic Acid, Venous: 1.8 mmol/L (ref 0.5–1.9)
Lactic Acid, Venous: 1.5 mmol/L (ref 0.5–1.9)

## 2021-05-18 LAB — CREATININE, SERUM
Creatinine, Ser: 0.93 mg/dL (ref 0.44–1.00)
GFR, Estimated: 55 mL/min — ABNORMAL LOW (ref >60)

## 2021-05-18 LAB — RESP PANEL BY RT-PCR (FLU A&B, COVID) ARPGX2
Influenza A by PCR: NEGATIVE
Influenza B by PCR: NEGATIVE
SARS Coronavirus 2 by RT PCR: POSITIVE — AB

## 2021-05-18 LAB — CBG MONITORING, ED
Glucose-Capillary: 226 mg/dL — ABNORMAL HIGH (ref 70–99)
Glucose-Capillary: 137 mg/dL — ABNORMAL HIGH (ref 70–99)

## 2021-05-18 LAB — LACTATE DEHYDROGENASE: LDH: 225 U/L — ABNORMAL HIGH (ref 98–192)

## 2021-05-18 LAB — PROCALCITONIN: Procalcitonin: <0.1 ng/mL

## 2021-05-18 LAB — C-REACTIVE PROTEIN: CRP: 2.8 mg/dL — ABNORMAL HIGH (ref <1.0)

## 2021-05-18 LAB — FIBRINOGEN: Fibrinogen: 462 mg/dL (ref 210–475)

## 2021-05-18 MED ORDER — METHYLPREDNISOLONE SODIUM SUCC 40 MG IJ SOLR
0.5000 mg/kg | Freq: Two times a day (BID) | INTRAMUSCULAR | Status: DC
  Administered 2021-05-18 – 2021-05-20 (×4): 26.4 mg via INTRAVENOUS
  Filled 2021-05-18 (×4): qty 1

## 2021-05-18 MED ORDER — GUAIFENESIN-DM 100-10 MG/5ML PO SYRP
10.0000 mL | ORAL_SOLUTION | ORAL | Status: DC | PRN
  Administered 2021-05-19: 23:00:00 10 mL via ORAL
  Filled 2021-05-18: qty 10

## 2021-05-18 MED ORDER — HEPARIN SODIUM (PORCINE) 5000 UNIT/ML IJ SOLN
5000.0000 [IU] | Freq: Three times a day (TID) | INTRAMUSCULAR | Status: DC
  Administered 2021-05-18 – 2021-05-20 (×6): 5000 [IU] via SUBCUTANEOUS
  Filled 2021-05-18 (×6): qty 1

## 2021-05-18 MED ORDER — INSULIN ASPART 100 UNIT/ML IJ SOLN
0.0000 [IU] | Freq: Three times a day (TID) | INTRAMUSCULAR | Status: DC
  Administered 2021-05-18 – 2021-05-19 (×2): 5 [IU] via SUBCUTANEOUS
  Administered 2021-05-19: 18:00:00 2 [IU] via SUBCUTANEOUS
  Administered 2021-05-19: 09:00:00 3 [IU] via SUBCUTANEOUS
  Administered 2021-05-20: 10:00:00 2 [IU] via SUBCUTANEOUS
  Administered 2021-05-20: 12:00:00 11 [IU] via SUBCUTANEOUS
  Filled 2021-05-18: qty 0.15

## 2021-05-18 MED ORDER — DEXAMETHASONE SODIUM PHOSPHATE 10 MG/ML IJ SOLN
6.0000 mg | Freq: Once | INTRAMUSCULAR | Status: AC
  Administered 2021-05-18: 13:00:00 6 mg via INTRAVENOUS
  Filled 2021-05-18: qty 1

## 2021-05-18 MED ORDER — PREDNISONE 5 MG PO TABS: 50.0000 mg | ORAL_TABLET | Freq: Every day | ORAL | Status: DC

## 2021-05-18 MED ORDER — ONDANSETRON HCL 4 MG PO TABS: 4.0000 mg | ORAL_TABLET | Freq: Four times a day (QID) | ORAL | Status: DC | PRN

## 2021-05-18 MED ORDER — ONDANSETRON HCL 4 MG/2ML IJ SOLN: 4.0000 mg | Freq: Four times a day (QID) | INTRAMUSCULAR | Status: DC | PRN

## 2021-05-18 MED ORDER — ZINC SULFATE 220 (50 ZN) MG PO CAPS
220.0000 mg | ORAL_CAPSULE | Freq: Every day | ORAL | Status: DC
  Administered 2021-05-18 – 2021-05-20 (×3): 220 mg via ORAL
  Filled 2021-05-18 (×3): qty 1

## 2021-05-18 MED ORDER — INSULIN ASPART 100 UNIT/ML IJ SOLN
0.0000 [IU] | Freq: Every day | INTRAMUSCULAR | Status: DC
  Administered 2021-05-19: 23:00:00 2 [IU] via SUBCUTANEOUS
  Filled 2021-05-18: qty 0.05

## 2021-05-18 MED ORDER — ASCORBIC ACID 500 MG PO TABS
500.0000 mg | ORAL_TABLET | Freq: Every day | ORAL | Status: DC
  Administered 2021-05-18 – 2021-05-20 (×3): 500 mg via ORAL
  Filled 2021-05-18 (×3): qty 1

## 2021-05-18 MED ORDER — SODIUM CHLORIDE 0.9 % IV SOLN
100.0000 mg | Freq: Every day | INTRAVENOUS | Status: DC
  Administered 2021-05-19 – 2021-05-20 (×2): 100 mg via INTRAVENOUS
  Filled 2021-05-18 (×4): qty 20

## 2021-05-18 MED ORDER — LORAZEPAM 0.5 MG PO TABS
0.5000 mg | ORAL_TABLET | ORAL | Status: DC | PRN
  Administered 2021-05-18: 13:00:00 0.5 mg via ORAL
  Filled 2021-05-18: qty 1

## 2021-05-18 MED ORDER — SODIUM CHLORIDE 0.9 % IV SOLN
200.0000 mg | Freq: Once | INTRAVENOUS | Status: AC
  Administered 2021-05-18: 14:00:00 200 mg via INTRAVENOUS
  Filled 2021-05-18: qty 40

## 2021-05-18 MED ORDER — HYDROCOD POLST-CPM POLST ER 10-8 MG/5ML PO SUER: 5.0000 mL | Freq: Two times a day (BID) | ORAL | Status: DC | PRN

## 2021-05-18 NOTE — ED Provider Notes (Signed)
I provided a substantive portion of the care of this patient.  I personally performed the entirety of the exam for this encounter.     Patient tested positive for COVID and sent by EMS from her doctor's office for concerns of sepsis.  Patient was found to be hypoxic at 87% on room air.  Patient is drowsy but awakens to voice and light touch.  Mild increased work of breathing at rest.  Patient does have crackles on exam.  Airways patent.  Abdomen soft and nondistended.  No significant peripheral edema.  Agree with plan of management.   Arby Barrette, MD 05/18/21 1247

## 2021-05-18 NOTE — ED Notes (Signed)
Noted pt soiled her pull-up. This Clinical research associate provided [employer]. Placed pt on female purewick. Provided clean gown, brief, and chucks.

## 2021-05-18 NOTE — ED Triage Notes (Addendum)
Pt BIBA. Per EMS pt coming from doctors appointment. Initial call was due to patient sating 88% on room air.  EMS reports pt has been more confused and weaker than normal today and also reports a cough. Pt tested positive for Covid on a rapid test.  98.1 T BP 172/72 HR 90 CBG 194 100% 2LNC

## 2021-05-18 NOTE — ED Provider Notes (Signed)
Sharon Conway Provider Note   CSN: [id number] Arrival date & time: 05/18/21  1136     History Chief Complaint  Patient presents with   Covid Positive   Possible Sepsis    Sharon Conway is a 87 y.o. female.  The history is provided by the EMS personnel, the patient and medical records. No language interpreter was used.   87 year old female with significant history of diabetes, chronic anemia, hypertension, diverticulosis, recently tested positive for COVID sent here via EMS from doctor's office with concerns for sepsis.  Per EMS note, patient was seen by her primary care provider today.  Patient has positive COVID infection as well as was noted to be hypoxic with O2 sats of 87% on room air.  When EMS arrived, patient was placed on 2 L of oxygen and brought here for further assessment.  No family member was available during initial visit.  I was unable to obtain much history from patient, she denies having any active symptom however she is a poor historian and is hard of hearing.  Level 5 caveat applies.  Past Medical History:  Diagnosis Date   Anemia    Diabetes mellitus without complication (HCC)    type 2   Diverticulosis    GI bleed 09/2015   Hypertension     Patient Active Problem List   Diagnosis Date Noted   GI bleed 03/29/2017   Lower GI bleed 12/21/2016   Diverticulosis of colon with hemorrhage    Hypertension 11/21/2016   Chronic anemia 10/28/2015   Malnutrition of moderate degree 10/01/2015   Gastrointestinal hemorrhage associated with intestinal diverticulitis    Diabetes mellitus type II, controlled (HCC) 09/22/2015    Past Surgical History:  Procedure Laterality Date   ABDOMINAL HYSTERECTOMY     COLONOSCOPY N/A 10/01/2015   Procedure: COLONOSCOPY;  Surgeon: Carman Ching, MD;  Location: [facility] ENDOSCOPY;  Service: Endoscopy;  Laterality: N/A;  contol of bleeding     OB History   No obstetric history on file.     No family  history on file.  Social History   Tobacco Use   Smoking status: Never   Smokeless tobacco: Never  Vaping Use   Vaping Use: Never used  Substance Use Topics   Alcohol use: No    Alcohol/week: 0.0 standard drinks   Drug use: No    Home Medications Prior to Admission medications   Medication Sig Start Date End Date Taking? Authorizing Provider  feeding supplement, ENSURE ENLIVE, (ENSURE ENLIVE) LIQD Take 237 mLs by mouth 2 (two) times daily between meals. 10/03/15   Hyacinth Meeker, MD  iron polysaccharides (NIFEREX) 150 MG capsule Take 1 capsule (150 mg total) by mouth daily. 11/21/16   Molt, Bethany, DO  metFORMIN (GLUCOPHAGE XR) 500 MG 24 hr tablet Take 1 tablet (500 mg total) by mouth daily with breakfast. 12/28/16 12/28/17  Burna Cash, MD  pantoprazole (PROTONIX) 40 MG tablet Take 1 tablet by mouth every morning. 04/24/17   Esterwood, Amy S, PA-C  rOPINIRole (REQUIP) 2 MG tablet Take 2 mg by mouth at bedtime.    [provider]  senna-docusate (SENOKOT-S) 8.6-50 MG tablet Take 1 tablet by mouth 2 (two) times daily. Patient taking differently: Take 1 tablet by mouth 2 (two) times daily as needed for mild constipation.  10/03/15   Hyacinth Meeker, MD  solifenacin (VESICARE) 5 MG tablet Take 1 tablet (5 mg total) by mouth daily. 11/21/16   Molt, Bethany, DO    Allergies  Patient has no known allergies.  Review of Systems   Review of Systems  Unable to perform ROS: Dementia  All other systems reviewed and are negative.  Physical Exam Updated Vital Signs BP (!) 171/50    Pulse 87    Temp 100.1 F (37.8 C) (Rectal)    Resp 20    Ht 5\' 3"  (1.6 m)    Wt 53.1 kg    SpO2 100%    BMI 20.74 kg/m   Physical Exam Vitals and nursing note reviewed.  Constitutional:      General: She is not in acute distress.    Appearance: She is well-developed.     Comments: Hard of hearing  HENT:     Head: Atraumatic.  Eyes:     Conjunctiva/sclera: Conjunctivae normal.   Cardiovascular:     Rate and Rhythm: Normal rate and regular rhythm.     Pulses: Normal pulses.     Heart sounds: Normal heart sounds.  Pulmonary:     Effort: Pulmonary effort is normal.  Abdominal:     Palpations: Abdomen is soft.     Tenderness: There is no abdominal tenderness.  Musculoskeletal:     Cervical back: Neck supple.  Skin:    Findings: No rash.  Neurological:     Mental Status: She is alert. She is disoriented.  Psychiatric:        Mood and Affect: Mood normal.    ED Results / Procedures / Treatments   Labs (all labs ordered are listed, but only abnormal results are displayed) Labs Reviewed  RESP PANEL BY RT-PCR (FLU A&B, COVID) ARPGX2 - Abnormal; Notable for the following components:      Result Value   SARS Coronavirus 2 by RT PCR POSITIVE (*)    All other components within normal limits  CBC WITH DIFFERENTIAL/PLATELET - Abnormal; Notable for the following components:   Hemoglobin 11.8 (*)    nRBC 0.6 (*)    Lymphs Abs 0.6 (*)    All other components within normal limits  COMPREHENSIVE METABOLIC PANEL - Abnormal; Notable for the following components:   Glucose, Bld 182 (*)    BUN 28 (*)    All other components within normal limits  LACTATE DEHYDROGENASE - Abnormal; Notable for the following components:   LDH 225 (*)    All other components within normal limits  CULTURE, BLOOD (ROUTINE X 2)  CULTURE, BLOOD (ROUTINE X 2)  LACTIC ACID, PLASMA  LACTIC ACID, PLASMA  PROCALCITONIN  FERRITIN  TRIGLYCERIDES  C-REACTIVE PROTEIN  D-DIMER, QUANTITATIVE (NOT AT [facility])  FIBRINOGEN    EKG None ED ECG REPORT   Date: 05/18/2021  Rate: 88  Rhythm: normal sinus rhythm  QRS Axis: normal  Intervals: normal  ST/T Wave abnormalities: nonspecific ST changes  Conduction Disutrbances:right bundle branch block  Narrative Interpretation:   Old EKG Reviewed: unchanged  I have personally reviewed the EKG tracing and agree with the computerized printout as  noted.   Radiology DG Chest Port 1 View  Result Date: 05/18/2021 CLINICAL DATA:  COVID positive. EXAM: PORTABLE CHEST 1 VIEW COMPARISON:  Chest x-ray dated March 29, 2017. FINDINGS: The heart size and mediastinal contours are within normal limits. Normal pulmonary vascularity. Unchanged scarring at the right costophrenic angle. No focal consolidation, pleural effusion, or pneumothorax. No acute osseous abnormality. IMPRESSION: 1. No active disease. Electronically Signed   By: Titus Dubin M.D.   On: 05/18/2021 13:01    Procedures .Critical Care Performed by: Domenic Moras, PA-C  Authorized by: Domenic Moras, PA-C   Critical care provider statement:    Critical care time (minutes):  35   Critical care was time spent personally by me on the following activities:  Development of treatment plan with patient or surrogate, discussions with consultants, evaluation of patient's response to treatment, examination of patient, ordering and review of laboratory studies, ordering and review of radiographic studies, ordering and performing treatments and interventions, pulse oximetry, re-evaluation of patient's condition and review of old charts   Medications Ordered in ED Medications  remdesivir 200 mg in sodium chloride 0.9% 250 mL IVPB (0 mg Intravenous Stopped 05/18/21 1412)    Followed by  remdesivir 100 mg in sodium chloride 0.9 % 100 mL IVPB (has no administration in time range)  LORazepam (ATIVAN) tablet 0.5 mg (0.5 mg Oral Given 05/18/21 1256)  dexamethasone (DECADRON) injection 6 mg (6 mg Intravenous Given 05/18/21 1258)    ED Course  I have reviewed the triage vital signs and the nursing notes.  Pertinent labs & imaging results that were available during my care of the patient were reviewed by me and considered in my medical decision making (see chart for details).    MDM Rules/Calculators/A&P                         BP (!) 171/50    Pulse 87    Temp 100.1 F (37.8 C) (Rectal)     Resp 20    Ht 5\' 3"  (1.6 m)    Wt 53.1 kg    SpO2 100%    BMI 20.74 kg/m      Final Clinical Impression(s) / ED Diagnoses Final diagnoses:  Acute hypoxemic respiratory failure due to COVID-19 [facility])    Rx / DC Orders ED Discharge Orders     None      12:10 PM Was able to obtain history through patient's daughter over the phone.  Patient lives with her daughter, for the past few days she has decrease in functionality, staying mostly in bed, has had diarrhea, and worsening cough.  She was seen by her PCP today and was tested positive for COVID infection as well as noted to be hypoxic and was sent here for further assessment.  She is hard of hearing at this time she does not have any specific complaint.  She is currently on 2 L of oxygen satting at 95%.  Work-up initiated, will provide Decadron and remdesivir and plan to consult for admission.  I did attempt to reach out to [facility] at [facility] over the phone but unable to speak with anyone directly.  Care discussed with Sharon Conway.   3:15 PM Patient is with her daughter who broke her wrist 3 weeks ago and currently on the mend.  Daughter is unable to fully care for patient at this time, and furthermore, patient is hypoxic.  Appreciate consultation to [facility] hospitalist, Sharon Conway who agrees to admit patient for acute respiratory failure secondary to COVID infection.  Sharon Conway was evaluated in Emergency Department on 05/18/2021 for the symptoms described in the history of present illness. She was evaluated in the context of the global COVID-19 pandemic, which necessitated consideration that the patient might be at risk for infection with the SARS-CoV-2 virus that causes COVID-19. Institutional protocols and algorithms that pertain to the evaluation of patients at risk for COVID-19 are in a state of rapid change based on information released by regulatory bodies including the CDC  and federal and state organizations. These policies and algorithms  were followed during the patient's care in the ED.    Domenic Moras, PA-C 05/18/21 1519    Charlesetta Shanks, MD 05/25/21 [phone]

## 2021-05-18 NOTE — H&P (Signed)
History and Physical    Sharon Conway KPT:[id number] DOB: 04/04/1919 DOA: 05/18/2021  PCP: [facility]  Patient coming from: Home  Chief Complaint: cough  HPI: Sharon Conway is a 87 y.o. female with medical history significant of DM2, GERD. Presenting with cough. History from daughter. Her daughter reports that the patient has cough starting about a week ago. It was non-productive. It didn't respond to OTC meds. Her daughter reports that the patient had several episodes of bowl and bladder incontinence over the next several days, but didn't have any other symptoms that were new with her cough. Today, she was notified by [facility] that there was an outbreak of COVID with their group and that her mother had complained of shortness of breath and cough. They tested her and she was positive for COVID. EMS was called. When they arrived, he was hypoxic on RA. She was brought to the ED for evaluation.  ED Course: She was COVID positive. CXR was negative. She was started on remdes and steroids. [facility] was called for admission.     Review of Systems:  Unable to obtain d/t mentation.   PMHx Past Medical History:  Diagnosis Date   Anemia    Diabetes mellitus without complication (HCC)    type 2   Diverticulosis    GI bleed 09/2015   Hypertension     PSHx Past Surgical History:  Procedure Laterality Date   ABDOMINAL HYSTERECTOMY     COLONOSCOPY N/A 10/01/2015   Procedure: COLONOSCOPY;  Surgeon: Carman Ching, MD;  Location: [facility] ENDOSCOPY;  Service: Endoscopy;  Laterality: N/A;  contol of bleeding    SocHx  reports that she has never smoked. She has never used smokeless tobacco. She reports that she does not drink alcohol and does not use drugs.  No Known Allergies  FamHx No family history on file.  Prior to Admission medications   Medication Sig Start Date End Date Taking? Authorizing Provider  feeding supplement, ENSURE ENLIVE, (ENSURE ENLIVE) LIQD Take 237 mLs by  mouth 2 (two) times daily between meals. 10/03/15   Hyacinth Meeker, MD  iron polysaccharides (NIFEREX) 150 MG capsule Take 1 capsule (150 mg total) by mouth daily. 11/21/16   Molt, Bethany, DO  metFORMIN (GLUCOPHAGE XR) 500 MG 24 hr tablet Take 1 tablet (500 mg total) by mouth daily with breakfast. 12/28/16 12/28/17  Burna Cash, MD  pantoprazole (PROTONIX) 40 MG tablet Take 1 tablet by mouth every morning. 04/24/17   Esterwood, Amy S, PA-C  rOPINIRole (REQUIP) 2 MG tablet Take 2 mg by mouth at bedtime.    [provider]  senna-docusate (SENOKOT-S) 8.6-50 MG tablet Take 1 tablet by mouth 2 (two) times daily. Patient taking differently: Take 1 tablet by mouth 2 (two) times daily as needed for mild constipation.  10/03/15   Hyacinth Meeker, MD  solifenacin (VESICARE) 5 MG tablet Take 1 tablet (5 mg total) by mouth daily. 11/21/16   Molt, Toma Copier, DO    Physical Exam: Vitals:   05/18/21 1430 05/18/21 1445 05/18/21 1508 05/18/21 1530  BP: (!) 180/48 (!) 171/50 (!) 171/50 135/68  Pulse: 85 87 87 86  Resp: 20  20 18   Temp:      TempSrc:      SpO2: 100% 100% 100% 100%  Weight:      Height:        General: 87 y.o. female resting in bed in NAD Eyes: PERRL, normal sclera ENMT: Nares patent w/o discharge, orophaynx clear,  dentition normal, ears w/o discharge/lesions/ulcers Neck: Supple, trachea midline Cardiovascular: RRR, +S1, S2, no m/g/r, equal pulses throughout Respiratory: decreased at bases, no w/r/r, slightly increased WOB on 2L  GI: BS+, NDNT, no masses noted, no organomegaly noted MSK: No e/c/c Neuro: somnolent  Labs on Admission: I have personally reviewed following labs and imaging studies  CBC: Recent Labs  Lab 05/18/21 1158  WBC 4.7  NEUTROABS 3.5  HGB 11.8*  HCT 39.0  MCV 93.8  PLT PLATELET CLUMPS NOTED ON SMEAR, UNABLE TO ESTIMATE   Basic Metabolic Panel: Recent Labs  Lab 05/18/21 1158  NA 138  K 5.0  CL 100  CO2 28  GLUCOSE 182*  BUN 28*   CREATININE 0.86  CALCIUM 9.3   GFR: Estimated Creatinine Clearance: 28.8 mL/min (by C-G formula based on SCr of 0.86 mg/dL). Liver Function Tests: Recent Labs  Lab 05/18/21 1158  AST 39  ALT 27  ALKPHOS 59  BILITOT 0.6  PROT 7.8  ALBUMIN 3.8   No results for input(s): LIPASE, AMYLASE in the last 168 hours. No results for input(s): AMMONIA in the last 168 hours. Coagulation Profile: No results for input(s): INR, PROTIME in the last 168 hours. Cardiac Enzymes: No results for input(s): CKTOTAL, CKMB, CKMBINDEX, TROPONINI in the last 168 hours. BNP (last 3 results) No results for input(s): PROBNP in the last 8760 hours. HbA1C: No results for input(s): HGBA1C in the last 72 hours. CBG: No results for input(s): GLUCAP in the last 168 hours. Lipid Profile: Recent Labs    05/18/21 1158  TRIG 91   Thyroid Function Tests: No results for input(s): TSH, T4TOTAL, FREET4, T3FREE, THYROIDAB in the last 72 hours. Anemia Panel: Recent Labs    05/18/21 1328  FERRITIN 61   Urine analysis: No results found for: COLORURINE, APPEARANCEUR, LABSPEC, PHURINE, GLUCOSEU, HGBUR, BILIRUBINUR, KETONESUR, PROTEINUR, UROBILINOGEN, NITRITE, LEUKOCYTESUR  Radiological Exams on Admission: DG Chest Port 1 View  Result Date: 05/18/2021 CLINICAL DATA:  COVID positive. EXAM: PORTABLE CHEST 1 VIEW COMPARISON:  Chest x-ray dated March 29, 2017. FINDINGS: The heart size and mediastinal contours are within normal limits. Normal pulmonary vascularity. Unchanged scarring at the right costophrenic angle. No focal consolidation, pleural effusion, or pneumothorax. No acute osseous abnormality. IMPRESSION: 1. No active disease. Electronically Signed   By: Obie Dredge M.D.   On: 05/18/2021 13:01    EKG: Independently reviewed. Sinus, no st elevation  Assessment/Plan COVID 19 infection Cough/dyspnea     - admit to inpt, med-surg     - continue remdes, steroids, nebs     - add guaifenesin, IS     -  follow inflammatory markers  DM2     - SSI, A1c, glucose checks, soft   DVT prophylaxis: heparin  Code Status: DNR confirmed w/ daughter  Family Communication: w/ daughter by phone  Consults called: None   Status is: Inpatient  Remains inpatient appropriate because: severity of illness  Time spent coordinating admission: 70 minutes  Sharon Panther A Jaquay Morneault DO [facility]  If 7PM-7AM, please contact night-coverage [email or web address]  05/18/2021, 3:36 PM

## 2021-05-18 NOTE — ED Notes (Signed)
Pt refused dinner tray. RN aware.

## 2021-05-19 DIAGNOSIS — U071 COVID-19: Secondary | ICD-10-CM | POA: Diagnosis not present

## 2021-05-19 LAB — CBG MONITORING, ED
Glucose-Capillary: 172 mg/dL — ABNORMAL HIGH (ref 70–99)
Glucose-Capillary: 216 mg/dL — ABNORMAL HIGH (ref 70–99)

## 2021-05-19 LAB — GLUCOSE, CAPILLARY
Glucose-Capillary: 133 mg/dL — ABNORMAL HIGH (ref 70–99)
Glucose-Capillary: 226 mg/dL — ABNORMAL HIGH (ref 70–99)

## 2021-05-19 MED ORDER — GABAPENTIN 300 MG PO CAPS
300.0000 mg | ORAL_CAPSULE | Freq: Every day | ORAL | Status: DC
  Administered 2021-05-19: 23:00:00 300 mg via ORAL
  Filled 2021-05-19: qty 1

## 2021-05-19 MED ORDER — ACETAMINOPHEN 325 MG PO TABS: 650.0000 mg | ORAL_TABLET | Freq: Four times a day (QID) | ORAL | Status: DC | PRN

## 2021-05-19 MED ORDER — LOSARTAN POTASSIUM 25 MG PO TABS
25.0000 mg | ORAL_TABLET | Freq: Every day | ORAL | Status: DC
  Administered 2021-05-19 – 2021-05-20 (×2): 25 mg via ORAL
  Filled 2021-05-19 (×2): qty 1

## 2021-05-19 MED ORDER — METOPROLOL SUCCINATE ER 25 MG PO TB24
12.5000 mg | ORAL_TABLET | Freq: Two times a day (BID) | ORAL | Status: DC
  Administered 2021-05-19 – 2021-05-20 (×3): 12.5 mg via ORAL
  Filled 2021-05-19 (×3): qty 1

## 2021-05-19 MED ORDER — METHIMAZOLE 5 MG PO TABS
5.0000 mg | ORAL_TABLET | Freq: Every day | ORAL | Status: DC
  Administered 2021-05-19 – 2021-05-20 (×2): 5 mg via ORAL
  Filled 2021-05-19 (×2): qty 1

## 2021-05-19 MED ORDER — PANTOPRAZOLE SODIUM 20 MG PO TBEC
20.0000 mg | DELAYED_RELEASE_TABLET | Freq: Every day | ORAL | Status: DC
  Administered 2021-05-19 – 2021-05-20 (×2): 20 mg via ORAL
  Filled 2021-05-19 (×2): qty 1

## 2021-05-19 MED ORDER — IPRATROPIUM-ALBUTEROL 20-100 MCG/ACT IN AERS
1.0000 | INHALATION_SPRAY | Freq: Four times a day (QID) | RESPIRATORY_TRACT | Status: DC
  Administered 2021-05-19 – 2021-05-20 (×2): 1 via RESPIRATORY_TRACT
  Filled 2021-05-19: qty 4

## 2021-05-19 MED ORDER — INSULIN GLARGINE-YFGN 100 UNIT/ML ~~LOC~~ SOLN
10.0000 [IU] | Freq: Every day | SUBCUTANEOUS | Status: DC
  Administered 2021-05-19: 23:00:00 10 [IU] via SUBCUTANEOUS
  Filled 2021-05-19 (×2): qty 0.1

## 2021-05-19 NOTE — Plan of Care (Signed)
  Problem: Clinical Measurements: Goal: Respiratory complications will improve Outcome: Progressing   Problem: Nutrition: Goal: Adequate nutrition will be maintained Outcome: Progressing   Problem: Safety: Goal: Ability to remain free from injury will improve Outcome: Progressing   Problem: Skin Integrity: Goal: Risk for impaired skin integrity will decrease Outcome: Progressing   

## 2021-05-19 NOTE — Progress Notes (Signed)
PROGRESS NOTE    Sharon Conway   BTD:[id number]  DOB: [birth year]  DOA: 05/18/2021 PCP: [facility]   Brief Narrative:  Sharon Conway a 87 y.o. female with medical history significant of DM2, GERD. Presenting with cough. History from daughter. Her daughter reports that the patient has cough starting about a week ago. It was non-productive. It didn't respond to OTC meds. Her daughter reports that the patient had several episodes of bowl and bladder incontinence over the next several days, but didn't have any other symptoms that were new with her cough. Today, she was notified by [facility] that there was an outbreak of COVID with their group and that her mother had complained of shortness of breath and cough. They tested her and she was positive for COVID. EMS was called. Pulse ox was noted to be 87-88%. She was started on Remdesivir and IV steriods in the ED   Subjective: Has a cough, no other complaints.     Assessment & Plan:   Principal Problem:   COVID-19 - cont Remdesivir x 3 days- cont IV steroids,Robitussin and Tussionex - no longer hypoxic- can go home tomorrow   Leukopenia - WBC 3.2- likely related to COVID  Normocytic anemia - Hgb 10   Hyperthyroids - cont Tapazole  HTN - cont Losartan and Metoprolol  DM2 - cont SSI- add Conway dose Lantus as sugars are high from steroids    Time spent in minutes: 35 DVT prophylaxis: heparin injection 5,000 Units Start: 05/18/21 1715  Code Status: DNR Family Communication:  Level of Care: Level of care: Med-Surg Disposition Plan:  Status is: Inpatient  Remains inpatient appropriate because: Needs one more day of Remdesivir      Consultants:  none Procedures:  none Antimicrobials:  Anti-infectives (From admission, onward)    Start     Dose/Rate Route Frequency Ordered Stop   05/19/21 1000  remdesivir 100 mg in sodium chloride 0.9 % 100 mL IVPB       See Hyperspace for full Linked Orders  Report.   100 mg 200 mL/hr over 30 Minutes Intravenous Daily 05/18/21 1202 05/23/21 0959   05/18/21 1300  remdesivir 200 mg in sodium chloride 0.9% 250 mL IVPB       See Hyperspace for full Linked Orders Report.   200 mg 580 mL/hr over 30 Minutes Intravenous Once 05/18/21 1202 05/18/21 1412        Objective: Vitals:   05/19/21 1059 05/19/21 1100 05/19/21 1200 05/19/21 1313  BP:  127/65 118/60 (!) 145/92  Pulse: 85 87 91 87  Resp: (!) 24 (!) 23 (!) 22 (!) 22  Temp:    99.1 F (37.3 C)  TempSrc:    Oral  SpO2: 95% 93% 94% 100%  Weight:      Height:       No intake or output data in the 24 hours ending 05/19/21 1456 Filed Weights   05/18/21 1149  Weight: 53.1 kg    Examination: General exam: Appears comfortable  HEENT: PERRLA, oral mucosa moist, no sclera icterus or thrush Respiratory system: Clear to auscultation. Has a cough and mildly elevated resp rate. Mild chest congestion. Cardiovascular system: S1 & S2 heard, RRR.   Gastrointestinal system: Abdomen soft, non-tender, nondistended. Normal bowel sounds. Central nervous system: Alert and oriented. No focal neurological deficits. Extremities: No cyanosis, clubbing or edema Skin: No rashes or ulcers Psychiatry:  Mood & affect appropriate.     Data Reviewed: I have personally reviewed  following labs and imaging studies  CBC: Recent Labs  Lab 05/18/21 1158 05/18/21 1901  WBC 4.7 3.2*  NEUTROABS 3.5  --   HGB 11.8* 10.0*  HCT 39.0 33.6*  MCV 93.8 93.3  PLT PLATELET CLUMPS NOTED ON SMEAR, UNABLE TO ESTIMATE 177   Basic Metabolic Panel: Recent Labs  Lab 05/18/21 1158 05/18/21 1901  NA 138  --   K 5.0  --   CL 100  --   CO2 28  --   GLUCOSE 182*  --   BUN 28*  --   CREATININE 0.86 0.93  CALCIUM 9.3  --    GFR: Estimated Creatinine Clearance: 26.6 mL/min (by C-G formula based on SCr of 0.93 mg/dL). Liver Function Tests: Recent Labs  Lab 05/18/21 1158  AST 39  ALT 27  ALKPHOS 59  BILITOT 0.6   PROT 7.8  ALBUMIN 3.8   No results for input(s): LIPASE, AMYLASE in the last 168 hours. No results for input(s): AMMONIA in the last 168 hours. Coagulation Profile: No results for input(s): INR, PROTIME in the last 168 hours. Cardiac Enzymes: No results for input(s): CKTOTAL, CKMB, CKMBINDEX, TROPONINI in the last 168 hours. BNP (last 3 results) No results for input(s): PROBNP in the last 8760 hours. HbA1C: No results for input(s): HGBA1C in the last 72 hours. CBG: Recent Labs  Lab 05/18/21 1859 05/18/21 2152 05/19/21 0737 05/19/21 1203  GLUCAP 226* 137* 172* 216*   Lipid Profile: Recent Labs    05/18/21 1158  TRIG 91   Thyroid Function Tests: No results for input(s): TSH, T4TOTAL, FREET4, T3FREE, THYROIDAB in the last 72 hours. Anemia Panel: Recent Labs    05/18/21 1328  FERRITIN 61   Urine analysis: No results found for: COLORURINE, APPEARANCEUR, LABSPEC, PHURINE, GLUCOSEU, HGBUR, BILIRUBINUR, KETONESUR, PROTEINUR, UROBILINOGEN, NITRITE, LEUKOCYTESUR Sepsis Labs: @LABRCNTIP (procalcitonin:4,lacticidven:4) ) Recent Results (from the past 240 hour(s))  Blood Culture (routine x 2)     Status: None (Preliminary result)   Collection Time: 05/18/21 11:58 AM   Specimen: BLOOD  Result Value Ref Range Status   Specimen Description   Final    BLOOD BLOOD RIGHT FOREARM Performed at [facility], [address] [place], [place]    Special Requests   Final    BOTTLES DRAWN AEROBIC ONLY Blood Culture results may not be optimal due to an inadequate volume of blood received in culture bottles Performed at [facility], [address] [place], [place]    Culture   Final    NO GROWTH < 24 HOURS Performed at [facility], [address], [place], [place]    Report Status PENDING  Incomplete  Blood Culture (routine x 2)     Status: None (Preliminary result)   Collection Time: 05/18/21 12:03 PM    Specimen: BLOOD  Result Value Ref Range Status   Specimen Description   Final    BLOOD RIGHT ANTECUBITAL Performed at [facility], [address], [place], [place]    Special Requests   Final    BOTTLES DRAWN AEROBIC ONLY Blood Culture results may not be optimal due to an inadequate volume of blood received in culture bottles Performed at [facility], [address] [place], [place]    Culture   Final    NO GROWTH < 24 HOURS Performed at [facility], [address] [place], [place]    Report Status  PENDING  Incomplete  Resp Panel by RT-PCR (Flu A&B, Covid) Nasopharyngeal Swab     Status: Abnormal   Collection Time: 05/18/21 12:39 PM   Specimen: Nasopharyngeal Swab; Nasopharyngeal(NP) swabs in vial transport medium  Result Value Ref Range Status   SARS Coronavirus 2 by RT PCR POSITIVE (A) NEGATIVE Final    Comment: (NOTE) SARS-CoV-2 target nucleic acids are DETECTED.  The SARS-CoV-2 RNA is generally detectable in upper respiratory specimens during the acute phase of infection. Positive results are indicative of the presence of the identified virus, but do not rule out bacterial infection or co-infection with other pathogens not detected by the test. Clinical correlation with patient history and other diagnostic information is necessary to determine patient infection status. The expected result is Negative.  Fact Sheet for Patients: BloggerCourse.com  Fact Sheet for Healthcare Providers: SeriousBroker.it  This test is not yet approved or cleared by the [place] FDA and  has been authorized for detection and/or diagnosis of SARS-CoV-2 by FDA under an Emergency Use Authorization (EUA).  This EUA will remain in effect (meaning this test can be used) for the duration of  the COVID-19 declaration under Section 564(b)(1) of the A ct, 21 U.S.C. section  360bbb-3(b)(1), unless the authorization is terminated or revoked sooner.     Influenza A by PCR NEGATIVE NEGATIVE Final   Influenza B by PCR NEGATIVE NEGATIVE Final    Comment: (NOTE) The Xpert Xpress SARS-CoV-2/FLU/RSV plus assay is intended as an aid in the diagnosis of influenza from Nasopharyngeal swab specimens and should not be used as a sole basis for treatment. Nasal washings and aspirates are unacceptable for Xpert Xpress SARS-CoV-2/FLU/RSV testing.  Fact Sheet for Patients: BloggerCourse.com  Fact Sheet for Healthcare Providers: SeriousBroker.it  This test is not yet approved or cleared by the [place] FDA and has been authorized for detection and/or diagnosis of SARS-CoV-2 by FDA under an Emergency Use Authorization (EUA). This EUA will remain in effect (meaning this test can be used) for the duration of the COVID-19 declaration under Section 564(b)(1) of the Act, 21 U.S.C. section 360bbb-3(b)(1), unless the authorization is terminated or revoked.  Performed at [facility], [address], [place], [place] [address]          Radiology Studies: [facility] Chest Port 1 View  Result Date: 05/18/2021 CLINICAL DATA:  COVID positive. EXAM: PORTABLE CHEST 1 VIEW COMPARISON:  Chest x-ray dated March 29, 2017. FINDINGS: The heart size and mediastinal contours are within normal limits. Normal pulmonary vascularity. Unchanged scarring at the right costophrenic angle. No focal consolidation, pleural effusion, or pneumothorax. No acute osseous abnormality. IMPRESSION: 1. No active disease. Electronically Signed   By: Obie Dredge M.D.   On: 05/18/2021 13:01      Scheduled Meds:  vitamin C  500 mg Oral Daily   heparin  5,000 Units Subcutaneous Q8H   insulin aspart  0-15 Units Subcutaneous TID WC   insulin aspart  0-5 Units Subcutaneous QHS   Ipratropium-Albuterol  1 puff Inhalation Q6H    methylPREDNISolone (SOLU-MEDROL) injection  0.5 mg/kg Intravenous Q12H   Followed by   Melene Muller ON 05/22/2021] predniSONE  50 mg Oral Daily   zinc sulfate  220 mg Oral Daily   Continuous Infusions:  remdesivir 100 mg in NS 100 mL 100 mg (05/19/21 0932)     LOS: 1 day      Calvert Cantor, MD [facility] Pager: [email or web address] 05/19/2021, 2:56 PM

## 2021-05-20 DIAGNOSIS — U071 COVID-19: Secondary | ICD-10-CM | POA: Diagnosis not present

## 2021-05-20 DIAGNOSIS — J9601 Acute respiratory failure with hypoxia: Secondary | ICD-10-CM | POA: Diagnosis not present

## 2021-05-20 LAB — D-DIMER, QUANTITATIVE: D-Dimer, Quant: 1.25 ug/mL-FEU — ABNORMAL HIGH (ref 0.00–0.50)

## 2021-05-20 LAB — COMPREHENSIVE METABOLIC PANEL
ALT: 22 U/L (ref 0–44)
AST: 24 U/L (ref 15–41)
Albumin: 3.6 g/dL (ref 3.5–5.0)
Alkaline Phosphatase: 47 U/L (ref 38–126)
Anion gap: 8 (ref 5–15)
BUN: 37 mg/dL — ABNORMAL HIGH (ref 8–23)
CO2: 29 mmol/L (ref 22–32)
Calcium: 9 mg/dL (ref 8.9–10.3)
Chloride: 97 mmol/L — ABNORMAL LOW (ref 98–111)
Creatinine, Ser: 0.76 mg/dL (ref 0.44–1.00)
GFR, Estimated: >60 mL/min (ref >60)
Glucose, Bld: 113 mg/dL — ABNORMAL HIGH (ref 70–99)
Potassium: 4.5 mmol/L (ref 3.5–5.1)
Sodium: 134 mmol/L — ABNORMAL LOW (ref 135–145)
Total Bilirubin: 0.6 mg/dL (ref 0.3–1.2)
Total Protein: 7.3 g/dL (ref 6.5–8.1)

## 2021-05-20 LAB — CBC WITH DIFFERENTIAL/PLATELET
Abs Immature Granulocytes: 0.01 10*3/uL (ref 0.00–0.07)
Basophils Absolute: 0 10*3/uL (ref 0.0–0.1)
Basophils Relative: 0 %
Eosinophils Absolute: 0 10*3/uL (ref 0.0–0.5)
Eosinophils Relative: 0 %
HCT: 33.9 % — ABNORMAL LOW (ref 36.0–46.0)
Hemoglobin: 10.4 g/dL — ABNORMAL LOW (ref 12.0–15.0)
Immature Granulocytes: 0 %
Lymphocytes Relative: 24 %
Lymphs Abs: 0.7 10*3/uL (ref 0.7–4.0)
MCH: 27.5 pg (ref 26.0–34.0)
MCHC: 30.7 g/dL (ref 30.0–36.0)
MCV: 89.7 fL (ref 80.0–100.0)
Monocytes Absolute: 0.3 10*3/uL (ref 0.1–1.0)
Monocytes Relative: 10 %
Neutro Abs: 1.8 10*3/uL (ref 1.7–7.7)
Neutrophils Relative %: 66 %
Platelets: 201 10*3/uL (ref 150–400)
RBC: 3.78 MIL/uL — ABNORMAL LOW (ref 3.87–5.11)
RDW: 12.4 % (ref 11.5–15.5)
WBC: 2.7 10*3/uL — ABNORMAL LOW (ref 4.0–10.5)
nRBC: 0 % (ref 0.0–0.2)

## 2021-05-20 LAB — GLUCOSE, CAPILLARY
Glucose-Capillary: 141 mg/dL — ABNORMAL HIGH (ref 70–99)
Glucose-Capillary: 325 mg/dL — ABNORMAL HIGH (ref 70–99)

## 2021-05-20 LAB — C-REACTIVE PROTEIN: CRP: 3.3 mg/dL — ABNORMAL HIGH (ref <1.0)

## 2021-05-20 MED ORDER — PREDNISONE 20 MG PO TABS: 40.0000 mg | ORAL_TABLET | Freq: Every day | ORAL | 0 refills | Status: AC

## 2021-05-20 MED ORDER — ZINC SULFATE 220 (50 ZN) MG PO CAPS: 220.0000 mg | ORAL_CAPSULE | Freq: Every day | ORAL | 0 refills | Status: AC

## 2021-05-20 MED ORDER — GUAIFENESIN-DM 100-10 MG/5ML PO SYRP: 10.0000 mL | ORAL_SOLUTION | ORAL | 0 refills | Status: AC | PRN

## 2021-05-20 MED ORDER — IPRATROPIUM-ALBUTEROL 20-100 MCG/ACT IN AERS
1.0000 | INHALATION_SPRAY | Freq: Four times a day (QID) | RESPIRATORY_TRACT | Status: DC | PRN
Start: 2021-05-20 — End: 2022-12-14

## 2021-05-20 MED ORDER — ASCORBIC ACID 500 MG PO TABS
500.0000 mg | ORAL_TABLET | Freq: Every day | ORAL | 0 refills | Status: AC
Start: 2021-05-21 — End: 2021-05-28

## 2021-05-20 NOTE — Progress Notes (Signed)
Transition of Care [facility]) Screening Note  Patient Details  Name: Sharon Conway Date of Birth: August 17, 1918  [facility] of [facility]) CM/SW Contact:    Ewing Schlein, LCSW Phone Number: 05/20/2021, 10:10 AM  Transition of Care Department [facility]) has reviewed patient and no TOC needs have been identified at this time. We will continue to monitor patient advancement through interdisciplinary progression rounds. If new patient transition needs arise, please place a TOC consult.

## 2021-05-20 NOTE — Progress Notes (Signed)
[facility] contacted regarding patient discharge. Informed patient is ready when they are available to get  her. [facility] to contact RN directly when en route.

## 2021-05-20 NOTE — Progress Notes (Signed)
Mobility Specialist - Progress Note    05/20/21 1100  Oxygen Therapy  SpO2 91 %  O2 Device Room Air  Mobility  Activity Ambulated in hall  Level of Assistance Minimal assist, patient does 75% or more  Assistive Device Front wheel walker  Distance Ambulated (ft) 60 ft  Mobility Ambulated with assistance in hallway  Mobility Response Tolerated well  Mobility performed by Mobility specialist  $Mobility charge 1 Mobility   Upon entry pt was agreeable to mobilize and required clean up prior to sitting EOB. Once at EOB, pt required Min A to stand, had 1 LOB episode, but regained stability shortly after. O2 sats recorded below while on RA. Pt used RW to ambulate 60 ft in hallway and did not c/o of pain or dizziness. Experienced some SOB and was encouraged to practice pursed breathing. At EOS, pt returned to room and was left in bed with bed alarm on.   Pre-mobility: 67 HR,  95% SpO2 During mobility: 98 HR, 91%-99% SpO2 Post-mobility: 65 HR,  96% SPO2  Arliss Journey Mobility Specialist Acute Rehabilitation Services Phone: [phone] 05/20/21, 11:18 AM

## 2021-05-20 NOTE — Progress Notes (Signed)
AMBULATION SATURATION:   Patient Saturations on Room Air at Rest = 98%  Patient Saturations on Room Air while Ambulating = 91%

## 2021-05-20 NOTE — Discharge Summary (Signed)
Physician Discharge Summary  Trent Theisen ZOX:[id number] DOB: 24-Apr-1919 DOA: 05/18/2021  PCP: [facility]  Admit date: 05/18/2021 Discharge date: 05/20/2021  Admitted From: home with daughter  Disposition:  same   Recommendations for Outpatient Follow-up:  Please check WBC count next week to be sure leukopenia has resolved  Home Health:  none  Discharge Condition:  stable   CODE STATUS:  DNR   Diet recommendation:  regular Consultations: none  Procedures/Studies: none   Discharge Diagnoses:  Principal Problem:   COVID-19 Active Problems:   Diabetes mellitus type II, controlled (HCC)   Chronic anemia     Brief Summary: Sharon Conway a 87 y.o. female with medical history significant of DM2, GERD. Presenting with cough. History from daughter. Her daughter reports that the patient has cough starting about a week ago. It was non-productive. It didn't respond to OTC meds. Her daughter reports that the patient had several episodes of bowl and bladder incontinence over the next several days. Today, she was notified by [facility] that there was an outbreak of COVID with their group and that her mother had complained of shortness of breath and cough. They tested her and she was positive for COVID. EMS was called. Pulse ox was noted to be 87-88%. She was started on Remdesivir and IV steriods in the ED  Hospital Course:  Principal Problem:   COVID-19 - cont Remdesivir x 3 days- cont IV steroids,Robitussin and Tussionex - hypoxia has resolved, she is ambulating with pulse ox > 90%, eating and drinking and is able to go home today - she still has a mild cough.     Leukopenia - WBC 3.2> 2.7 - likely related to COVID   Normocytic anemia - Hgb 10    Hyperthyroidism - cont Tapazole   HTN - cont Losartan and Metoprolol   DM2 -  resume Metformin  HOH     Discharge Exam: Vitals:   05/20/21 0953 05/20/21 1100  BP: 118/79   Pulse: 89   Resp:     Temp:    SpO2:  (P) 91%   Vitals:   05/20/21 0623 05/20/21 0745 05/20/21 0953 05/20/21 1100  BP: (!) 126/112 (!) 136/111 118/79   Pulse: 83 85 89   Resp: 16 15    Temp:  98.3 F (36.8 C)    TempSrc:  Oral    SpO2: 100% 99%  (P) 91%  Weight:      Height:        General: Pt is alert, awake, not in acute distress Cardiovascular: RRR, S1/S2 +, no rubs, no gallops Respiratory: CTA bilaterally, no wheezing, no rhonchi Abdominal: Soft, NT, ND, bowel sounds + Extremities: no edema, no cyanosis   Discharge Instructions  Discharge Instructions     Diet - low sodium heart healthy   Complete by: As directed    Diet Carb Modified   Complete by: As directed    Increase activity slowly   Complete by: As directed       Allergies as of 05/20/2021   No Known Allergies      Medication List     STOP taking these medications    Acetaminophen Extra Strength 167 MG/5ML Liqd Generic drug: Acetaminophen   guaiFENesin 200 MG/10ML Liqd       TAKE these medications    ascorbic acid 500 MG tablet Commonly known as: VITAMIN C Take 1 tablet (500 mg total) by mouth daily for 7 days. Start taking on: May 21, 2021   diclofenac Sodium 1 % Gel Commonly known as: VOLTAREN Apply 1 application topically 2 (two) times daily as needed (arthritis pain).   feeding supplement Liqd Take 237 mLs by mouth 2 (two) times daily between meals. What changed: when to take this   gabapentin 300 MG capsule Commonly known as: NEURONTIN Take 300 mg by mouth at bedtime.   guaiFENesin-dextromethorphan 100-10 MG/5ML syrup Commonly known as: ROBITUSSIN DM Take 10 mLs by mouth every 4 (four) hours as needed for cough.   Ipratropium-Albuterol 20-100 MCG/ACT Aers respimat Commonly known as: COMBIVENT Inhale 1 puff into the lungs every 6 (six) hours as needed for wheezing.   losartan 25 MG tablet Commonly known as: COZAAR Take 25 mg by mouth daily.   metFORMIN 500 MG tablet Commonly known  as: GLUCOPHAGE Take 500 mg by mouth daily.   methimazole 5 MG tablet Commonly known as: TAPAZOLE Take 5 mg by mouth daily.   metoprolol succinate 25 MG 24 hr tablet Commonly known as: TOPROL-XL Take 12.5 mg by mouth 2 (two) times daily.   pantoprazole 20 MG tablet Commonly known as: PROTONIX Take 20 mg by mouth daily.   polyethylene glycol powder 17 GM/SCOOP powder Commonly known as: GLYCOLAX/MIRALAX Take 17 g by mouth daily as needed for mild constipation.   predniSONE 20 MG tablet Commonly known as: DELTASONE Take 2 tablets (40 mg total) by mouth daily for 3 days. Start taking on: May 22, 2021   PreserVision AREDS 2 Caps Take 1 capsule by mouth daily.   zinc sulfate 220 (50 Zn) MG capsule Take 1 capsule (220 mg total) by mouth daily for 7 days. Start taking on: May 21, 2021        No Known Allergies    DG Chest Port 1 View  Result Date: 05/18/2021 CLINICAL DATA:  COVID positive. EXAM: PORTABLE CHEST 1 VIEW COMPARISON:  Chest x-ray dated March 29, 2017. FINDINGS: The heart size and mediastinal contours are within normal limits. Normal pulmonary vascularity. Unchanged scarring at the right costophrenic angle. No focal consolidation, pleural effusion, or pneumothorax. No acute osseous abnormality. IMPRESSION: 1. No active disease. Electronically Signed   By: Obie Dredge M.D.   On: 05/18/2021 13:01     The results of significant diagnostics from this hospitalization (including imaging, microbiology, ancillary and laboratory) are listed below for reference.     Microbiology: Recent Results (from the past 240 hour(s))  Blood Culture (routine x 2)     Status: None (Preliminary result)   Collection Time: 05/18/21 11:58 AM   Specimen: BLOOD  Result Value Ref Range Status   Specimen Description   Final    BLOOD BLOOD RIGHT FOREARM Performed at [facility], [address] [place], [place] [address]    Special Requests   Final     BOTTLES DRAWN AEROBIC ONLY Blood Culture results may not be optimal due to an inadequate volume of blood received in culture bottles Performed at [facility], [address] [place], [place] [address]    Culture   Final    NO GROWTH 2 DAYS Performed at [facility], [address], [place], [place] [address]    Report Status PENDING  Incomplete  Blood Culture (routine x 2)     Status: None (Preliminary result)   Collection Time: 05/18/21 12:03 PM   Specimen: BLOOD  Result Value Ref Range Status   Specimen Description   Final    BLOOD RIGHT ANTECUBITAL Performed  at [facility], [address], [place], [place] [address]    Special Requests   Final    BOTTLES DRAWN AEROBIC ONLY Blood Culture results may not be optimal due to an inadequate volume of blood received in culture bottles Performed at [facility], [address] [place], [place] [address]    Culture   Final    NO GROWTH 2 DAYS Performed at [facility], [address], [place], [place] [address]    Report Status PENDING  Incomplete  Resp Panel by RT-PCR (Flu A&B, Covid) Nasopharyngeal Swab     Status: Abnormal   Collection Time: 05/18/21 12:39 PM   Specimen: Nasopharyngeal Swab; Nasopharyngeal(NP) swabs in vial transport medium  Result Value Ref Range Status   SARS Coronavirus 2 by RT PCR POSITIVE (A) NEGATIVE Final    Comment: (NOTE) SARS-CoV-2 target nucleic acids are DETECTED.  The SARS-CoV-2 RNA is generally detectable in upper respiratory specimens during the acute phase of infection. Positive results are indicative of the presence of the identified virus, but do not rule out bacterial infection or co-infection with other pathogens not detected by the test. Clinical correlation with patient history and other diagnostic information is necessary to determine patient infection status. The expected result is Negative.  Fact Sheet for  Patients: BloggerCourse.com  Fact Sheet for Healthcare Providers: SeriousBroker.it  This test is not yet approved or cleared by the [place] FDA and  has been authorized for detection and/or diagnosis of SARS-CoV-2 by FDA under an Emergency Use Authorization (EUA).  This EUA will remain in effect (meaning this test can be used) for the duration of  the COVID-19 declaration under Section 564(b)(1) of the A ct, 21 U.S.C. section 360bbb-3(b)(1), unless the authorization is terminated or revoked sooner.     Influenza A by PCR NEGATIVE NEGATIVE Final   Influenza B by PCR NEGATIVE NEGATIVE Final    Comment: (NOTE) The Xpert Xpress SARS-CoV-2/FLU/RSV plus assay is intended as an aid in the diagnosis of influenza from Nasopharyngeal swab specimens and should not be used as a sole basis for treatment. Nasal washings and aspirates are unacceptable for Xpert Xpress SARS-CoV-2/FLU/RSV testing.  Fact Sheet for Patients: BloggerCourse.com  Fact Sheet for Healthcare Providers: SeriousBroker.it  This test is not yet approved or cleared by the [place] FDA and has been authorized for detection and/or diagnosis of SARS-CoV-2 by FDA under an Emergency Use Authorization (EUA). This EUA will remain in effect (meaning this test can be used) for the duration of the COVID-19 declaration under Section 564(b)(1) of the Act, 21 U.S.C. section 360bbb-3(b)(1), unless the authorization is terminated or revoked.  Performed at [facility], [address], [place], [place] [address]      Labs: BNP (last 3 results) No results for input(s): BNP in the last 8760 hours. Basic Metabolic Panel: Recent Labs  Lab 05/18/21 1158 05/18/21 1901 05/20/21 0330  NA 138  --  134*  K 5.0  --  4.5  CL 100  --  97*  CO2 28  --  29  GLUCOSE 182*  --  113*  BUN 28*  --  37*  CREATININE  0.86 0.93 0.76  CALCIUM 9.3  --  9.0   Liver Function Tests: Recent Labs  Lab 05/18/21 1158 05/20/21 0330  AST 39 24  ALT 27 22  ALKPHOS 59 47  BILITOT 0.6 0.6  PROT 7.8 7.3  ALBUMIN 3.8 3.6   No results  for input(s): LIPASE, AMYLASE in the last 168 hours. No results for input(s): AMMONIA in the last 168 hours. CBC: Recent Labs  Lab 05/18/21 1158 05/18/21 1901 05/20/21 0330  WBC 4.7 3.2* 2.7*  NEUTROABS 3.5  --  1.8  HGB 11.8* 10.0* 10.4*  HCT 39.0 33.6* 33.9*  MCV 93.8 93.3 89.7  PLT PLATELET CLUMPS NOTED ON SMEAR, UNABLE TO ESTIMATE 177 201   Cardiac Enzymes: No results for input(s): CKTOTAL, CKMB, CKMBINDEX, TROPONINI in the last 168 hours. BNP: Invalid input(s): POCBNP CBG: Recent Labs  Lab 05/19/21 1203 05/19/21 1634 05/19/21 2141 05/20/21 0721 05/20/21 1109  GLUCAP 216* 133* 226* 141* 325*   D-Dimer Recent Labs    05/18/21 1325 05/20/21 0330  DDIMER 1.80* 1.25*   Hgb A1c No results for input(s): HGBA1C in the last 72 hours. Lipid Profile Recent Labs    05/18/21 1158  TRIG 91   Thyroid function studies No results for input(s): TSH, T4TOTAL, T3FREE, THYROIDAB in the last 72 hours.  Invalid input(s): FREET3 Anemia work up Recent Labs    05/18/21 1328  FERRITIN 61   Urinalysis No results found for: COLORURINE, APPEARANCEUR, LABSPEC, PHURINE, GLUCOSEU, HGBUR, BILIRUBINUR, KETONESUR, PROTEINUR, UROBILINOGEN, NITRITE, LEUKOCYTESUR Sepsis Labs Invalid input(s): PROCALCITONIN,  WBC,  LACTICIDVEN Microbiology Recent Results (from the past 240 hour(s))  Blood Culture (routine x 2)     Status: None (Preliminary result)   Collection Time: 05/18/21 11:58 AM   Specimen: BLOOD  Result Value Ref Range Status   Specimen Description   Final    BLOOD BLOOD RIGHT FOREARM Performed at [facility], [address], [place], [place] [address]    Special Requests   Final    BOTTLES DRAWN AEROBIC ONLY Blood Culture results may not be  optimal due to an inadequate volume of blood received in culture bottles Performed at [facility], [address], [place], [place] [address]    Culture   Final    NO GROWTH 2 DAYS Performed at [facility], [address] [place], [place] [address]    Report Status PENDING  Incomplete  Blood Culture (routine x 2)     Status: None (Preliminary result)   Collection Time: 05/18/21 12:03 PM   Specimen: BLOOD  Result Value Ref Range Status   Specimen Description   Final    BLOOD RIGHT ANTECUBITAL Performed at [facility], [address], [place], [place] [address]    Special Requests   Final    BOTTLES DRAWN AEROBIC ONLY Blood Culture results may not be optimal due to an inadequate volume of blood received in culture bottles Performed at [facility], [address], [place], [place] [address]    Culture   Final    NO GROWTH 2 DAYS Performed at [facility], [address] [place], [place] [address]    Report Status PENDING  Incomplete  Resp Panel by RT-PCR (Flu A&B, Covid) Nasopharyngeal Swab     Status: Abnormal   Collection Time: 05/18/21 12:39 PM   Specimen: Nasopharyngeal Swab; Nasopharyngeal(NP) swabs in vial transport medium  Result Value Ref Range Status   SARS Coronavirus 2 by RT PCR POSITIVE (A) NEGATIVE Final    Comment: (NOTE) SARS-CoV-2 target nucleic acids are DETECTED.  The SARS-CoV-2 RNA is generally detectable in upper respiratory specimens during the acute phase of infection. Positive results are indicative of the presence of the identified virus, but do not rule out bacterial  infection or co-infection with other pathogens not detected by the test. Clinical correlation with patient history and other diagnostic information is necessary to determine patient infection status. The expected result is Negative.  Fact Sheet for Patients: BloggerCourse.com  Fact Sheet for  Healthcare Providers: SeriousBroker.it  This test is not yet approved or cleared by the [place] FDA and  has been authorized for detection and/or diagnosis of SARS-CoV-2 by FDA under an Emergency Use Authorization (EUA).  This EUA will remain in effect (meaning this test can be used) for the duration of  the COVID-19 declaration under Section 564(b)(1) of the A ct, 21 U.S.C. section 360bbb-3(b)(1), unless the authorization is terminated or revoked sooner.     Influenza A by PCR NEGATIVE NEGATIVE Final   Influenza B by PCR NEGATIVE NEGATIVE Final    Comment: (NOTE) The Xpert Xpress SARS-CoV-2/FLU/RSV plus assay is intended as an aid in the diagnosis of influenza from Nasopharyngeal swab specimens and should not be used as a sole basis for treatment. Nasal washings and aspirates are unacceptable for Xpert Xpress SARS-CoV-2/FLU/RSV testing.  Fact Sheet for Patients: BloggerCourse.com  Fact Sheet for Healthcare Providers: SeriousBroker.it  This test is not yet approved or cleared by the [place] FDA and has been authorized for detection and/or diagnosis of SARS-CoV-2 by FDA under an Emergency Use Authorization (EUA). This EUA will remain in effect (meaning this test can be used) for the duration of the COVID-19 declaration under Section 564(b)(1) of the Act, 21 U.S.C. section 360bbb-3(b)(1), unless the authorization is terminated or revoked.  Performed at [facility], [address] [place], [place] [address]      Time coordinating discharge in minutes: 65  SIGNED:   Calvert Cantor, MD  [facility] 05/20/2021, 11:17 AM

## 2021-05-23 LAB — CULTURE, BLOOD (ROUTINE X 2)
Culture: NO GROWTH
Culture: NO GROWTH

## 2021-12-01 ENCOUNTER — Observation Stay (HOSPITAL_COMMUNITY): Payer: Medicare (Managed Care)

## 2021-12-01 ENCOUNTER — Emergency Department (HOSPITAL_COMMUNITY): Payer: Medicare (Managed Care)

## 2021-12-01 ENCOUNTER — Other Ambulatory Visit: Payer: Self-pay

## 2021-12-01 ENCOUNTER — Observation Stay (HOSPITAL_COMMUNITY)
Admission: EM | Admit: 2021-12-01 | Discharge: 2021-12-02 | Disposition: A | Discharge status: Home / Self Care | Payer: Medicare (Managed Care) | Attending: Internal Medicine | Admitting: Internal Medicine

## 2021-12-01 DIAGNOSIS — Z9181 History of falling: Secondary | ICD-10-CM | POA: Diagnosis not present

## 2021-12-01 DIAGNOSIS — D649 Anemia, unspecified: Secondary | ICD-10-CM | POA: Diagnosis not present

## 2021-12-01 DIAGNOSIS — R4182 Altered mental status, unspecified: Secondary | ICD-10-CM | POA: Diagnosis present

## 2021-12-01 DIAGNOSIS — I10 Essential (primary) hypertension: Secondary | ICD-10-CM | POA: Diagnosis present

## 2021-12-01 DIAGNOSIS — J90 Pleural effusion, not elsewhere classified: Secondary | ICD-10-CM | POA: Diagnosis not present

## 2021-12-01 DIAGNOSIS — E0590 Thyrotoxicosis, unspecified without thyrotoxic crisis or storm: Secondary | ICD-10-CM

## 2021-12-01 DIAGNOSIS — W19XXXA Unspecified fall, initial encounter: Secondary | ICD-10-CM

## 2021-12-01 DIAGNOSIS — G9341 Metabolic encephalopathy: Secondary | ICD-10-CM | POA: Diagnosis not present

## 2021-12-01 DIAGNOSIS — Z79899 Other long term (current) drug therapy: Secondary | ICD-10-CM | POA: Insufficient documentation

## 2021-12-01 DIAGNOSIS — K5731 Diverticulosis of large intestine without perforation or abscess with bleeding: Secondary | ICD-10-CM | POA: Diagnosis not present

## 2021-12-01 DIAGNOSIS — E039 Hypothyroidism, unspecified: Secondary | ICD-10-CM | POA: Diagnosis not present

## 2021-12-01 DIAGNOSIS — J181 Lobar pneumonia, unspecified organism: Secondary | ICD-10-CM | POA: Diagnosis not present

## 2021-12-01 DIAGNOSIS — E119 Type 2 diabetes mellitus without complications: Secondary | ICD-10-CM

## 2021-12-01 DIAGNOSIS — Z7984 Long term (current) use of oral hypoglycemic drugs: Secondary | ICD-10-CM | POA: Insufficient documentation

## 2021-12-01 DIAGNOSIS — Z20822 Contact with and (suspected) exposure to covid-19: Secondary | ICD-10-CM | POA: Diagnosis not present

## 2021-12-01 DIAGNOSIS — M6281 Muscle weakness (generalized): Secondary | ICD-10-CM | POA: Insufficient documentation

## 2021-12-01 DIAGNOSIS — J189 Pneumonia, unspecified organism: Secondary | ICD-10-CM | POA: Diagnosis present

## 2021-12-01 DIAGNOSIS — Y92009 Unspecified place in unspecified non-institutional (private) residence as the place of occurrence of the external cause: Secondary | ICD-10-CM

## 2021-12-01 LAB — I-STAT VENOUS BLOOD GAS, ED
Acid-Base Excess: 7 mmol/L — ABNORMAL HIGH (ref 0.0–2.0)
Bicarbonate: 34.7 mmol/L — ABNORMAL HIGH (ref 20.0–28.0)
Calcium, Ion: 1.28 mmol/L (ref 1.15–1.40)
HCT: 32 % — ABNORMAL LOW (ref 36.0–46.0)
Hemoglobin: 10.9 g/dL — ABNORMAL LOW (ref 12.0–15.0)
O2 Saturation: 91 %
Potassium: 4.8 mmol/L (ref 3.5–5.1)
Sodium: 139 mmol/L (ref 135–145)
TCO2: 37 mmol/L — ABNORMAL HIGH (ref 22–32)
pCO2, Ven: 64.1 mmHg — ABNORMAL HIGH (ref 44–60)
pH, Ven: 7.342 (ref 7.25–7.43)
pO2, Ven: 67 mmHg — ABNORMAL HIGH (ref 32–45)

## 2021-12-01 LAB — URINALYSIS, ROUTINE W REFLEX MICROSCOPIC
Bilirubin Urine: NEGATIVE
Glucose, UA: NEGATIVE mg/dL
Hgb urine dipstick: NEGATIVE
Ketones, ur: NEGATIVE mg/dL
Leukocytes,Ua: NEGATIVE
Nitrite: NEGATIVE
Protein, ur: NEGATIVE mg/dL
Specific Gravity, Urine: 1.013 (ref 1.005–1.030)
pH: 5 (ref 5.0–8.0)

## 2021-12-01 LAB — CBC WITH DIFFERENTIAL/PLATELET
Abs Immature Granulocytes: 0.01 10*3/uL (ref 0.00–0.07)
Basophils Absolute: 0 10*3/uL (ref 0.0–0.1)
Basophils Relative: 0 %
Eosinophils Absolute: 0.1 10*3/uL (ref 0.0–0.5)
Eosinophils Relative: 1 %
HCT: 32.1 % — ABNORMAL LOW (ref 36.0–46.0)
Hemoglobin: 9.4 g/dL — ABNORMAL LOW (ref 12.0–15.0)
Immature Granulocytes: 0 %
Lymphocytes Relative: 32 %
Lymphs Abs: 1.3 10*3/uL (ref 0.7–4.0)
MCH: 27.1 pg (ref 26.0–34.0)
MCHC: 29.3 g/dL — ABNORMAL LOW (ref 30.0–36.0)
MCV: 92.5 fL (ref 80.0–100.0)
Monocytes Absolute: 0.4 10*3/uL (ref 0.1–1.0)
Monocytes Relative: 11 %
Neutro Abs: 2.2 10*3/uL (ref 1.7–7.7)
Neutrophils Relative %: 56 %
Platelets: 209 10*3/uL (ref 150–400)
RBC: 3.47 MIL/uL — ABNORMAL LOW (ref 3.87–5.11)
RDW: 13 % (ref 11.5–15.5)
WBC: 4 10*3/uL (ref 4.0–10.5)
nRBC: 0 % (ref 0.0–0.2)

## 2021-12-01 LAB — GLUCOSE, CAPILLARY: Glucose-Capillary: 248 mg/dL — ABNORMAL HIGH (ref 70–99)

## 2021-12-01 LAB — COMPREHENSIVE METABOLIC PANEL
ALT: 12 U/L (ref 0–44)
AST: 18 U/L (ref 15–41)
Albumin: 3.3 g/dL — ABNORMAL LOW (ref 3.5–5.0)
Alkaline Phosphatase: 95 U/L (ref 38–126)
Anion gap: 6 (ref 5–15)
BUN: 19 mg/dL (ref 8–23)
CO2: 32 mmol/L (ref 22–32)
Calcium: 9.5 mg/dL (ref 8.9–10.3)
Chloride: 101 mmol/L (ref 98–111)
Creatinine, Ser: 0.87 mg/dL (ref 0.44–1.00)
GFR, Estimated: 59 mL/min — ABNORMAL LOW (ref >60)
Glucose, Bld: 136 mg/dL — ABNORMAL HIGH (ref 70–99)
Potassium: 4.8 mmol/L (ref 3.5–5.1)
Sodium: 139 mmol/L (ref 135–145)
Total Bilirubin: 0.3 mg/dL (ref 0.3–1.2)
Total Protein: 6.7 g/dL (ref 6.5–8.1)

## 2021-12-01 LAB — I-STAT ARTERIAL BLOOD GAS, ED
Acid-Base Excess: 5 mmol/L — ABNORMAL HIGH (ref 0.0–2.0)
Bicarbonate: 31.7 mmol/L — ABNORMAL HIGH (ref 20.0–28.0)
Calcium, Ion: 1.31 mmol/L (ref 1.15–1.40)
HCT: 31 % — ABNORMAL LOW (ref 36.0–46.0)
Hemoglobin: 10.5 g/dL — ABNORMAL LOW (ref 12.0–15.0)
O2 Saturation: 90 %
Patient temperature: 98.1
Potassium: 4.7 mmol/L (ref 3.5–5.1)
Sodium: 139 mmol/L (ref 135–145)
TCO2: 33 mmol/L — ABNORMAL HIGH (ref 22–32)
pCO2 arterial: 55.7 mmHg — ABNORMAL HIGH (ref 32–48)
pH, Arterial: 7.363 (ref 7.35–7.45)
pO2, Arterial: 61 mmHg — ABNORMAL LOW (ref 83–108)

## 2021-12-01 LAB — HEMOGLOBIN A1C
Hgb A1c MFr Bld: 8.1 % — ABNORMAL HIGH (ref 4.8–5.6)
Mean Plasma Glucose: 185.77 mg/dL

## 2021-12-01 LAB — RAPID URINE DRUG SCREEN, HOSP PERFORMED
Amphetamines: NOT DETECTED
Barbiturates: NOT DETECTED
Benzodiazepines: NOT DETECTED
Cocaine: NOT DETECTED
Opiates: NOT DETECTED
Tetrahydrocannabinol: NOT DETECTED

## 2021-12-01 LAB — CBG MONITORING, ED
Glucose-Capillary: 132 mg/dL — ABNORMAL HIGH (ref 70–99)
Glucose-Capillary: 115 mg/dL — ABNORMAL HIGH (ref 70–99)

## 2021-12-01 LAB — SARS CORONAVIRUS 2 BY RT PCR: SARS Coronavirus 2 by RT PCR: NEGATIVE

## 2021-12-01 LAB — HIV ANTIBODY (ROUTINE TESTING W REFLEX): HIV Screen 4th Generation wRfx: NONREACTIVE

## 2021-12-01 LAB — TSH: TSH: 9.396 u[IU]/mL — ABNORMAL HIGH (ref 0.350–4.500)

## 2021-12-01 MED ORDER — METOPROLOL SUCCINATE ER 25 MG PO TB24
12.5000 mg | ORAL_TABLET | Freq: Two times a day (BID) | ORAL | Status: DC
  Administered 2021-12-02: 12.5 mg via ORAL
  Filled 2021-12-01: qty 1

## 2021-12-01 MED ORDER — LOSARTAN POTASSIUM 25 MG PO TABS
25.0000 mg | ORAL_TABLET | Freq: Every day | ORAL | Status: DC
  Administered 2021-12-01 – 2021-12-02 (×2): 25 mg via ORAL
  Filled 2021-12-01 (×3): qty 1

## 2021-12-01 MED ORDER — METOPROLOL SUCCINATE ER 25 MG PO TB24: 12.5000 mg | ORAL_TABLET | Freq: Two times a day (BID) | ORAL | Status: DC

## 2021-12-01 MED ORDER — CEFTRIAXONE SODIUM 1 G IJ SOLR
1.0000 g | INTRAMUSCULAR | Status: DC
Start: 2021-12-02 — End: 2021-12-02
  Administered 2021-12-02: 1 g via INTRAVENOUS
  Filled 2021-12-01: qty 10

## 2021-12-01 MED ORDER — GUAIFENESIN-DM 100-10 MG/5ML PO SYRP
10.0000 mL | ORAL_SOLUTION | ORAL | Status: DC | PRN
Start: 2021-12-01 — End: 2021-12-02

## 2021-12-01 MED ORDER — PRESERVISION AREDS 2 PO CAPS: 1.0000 | ORAL_CAPSULE | Freq: Every day | ORAL | Status: DC

## 2021-12-01 MED ORDER — METHIMAZOLE 5 MG PO TABS
5.0000 mg | ORAL_TABLET | Freq: Every day | ORAL | Status: DC
  Administered 2021-12-01 – 2021-12-02 (×2): 5 mg via ORAL
  Filled 2021-12-01 (×2): qty 1

## 2021-12-01 MED ORDER — LIDOCAINE 5 % EX PTCH
1.0000 | MEDICATED_PATCH | CUTANEOUS | Status: DC
Start: 2021-12-01 — End: 2021-12-02
  Administered 2021-12-02: 1 via TRANSDERMAL
  Filled 2021-12-01 (×2): qty 1

## 2021-12-01 MED ORDER — POLYETHYLENE GLYCOL 3350 17 G PO PACK: 17.0000 g | PACK | Freq: Every day | ORAL | Status: DC | PRN

## 2021-12-01 MED ORDER — SODIUM CHLORIDE 0.9 % IV SOLN
1.0000 g | Freq: Once | INTRAVENOUS | Status: AC
  Administered 2021-12-01: 1 g via INTRAVENOUS
  Filled 2021-12-01: qty 10

## 2021-12-01 MED ORDER — SODIUM CHLORIDE 0.9 % IV SOLN
500.0000 mg | Freq: Once | INTRAVENOUS | Status: DC
  Filled 2021-12-01: qty 5

## 2021-12-01 MED ORDER — INSULIN ASPART 100 UNIT/ML IJ SOLN
0.0000 [IU] | Freq: Three times a day (TID) | INTRAMUSCULAR | Status: DC
  Administered 2021-12-02: 1 [IU] via SUBCUTANEOUS
  Administered 2021-12-02: 2 [IU] via SUBCUTANEOUS

## 2021-12-01 MED ORDER — DOXYCYCLINE HYCLATE 100 MG PO TABS
100.0000 mg | ORAL_TABLET | Freq: Two times a day (BID) | ORAL | Status: DC
Start: 2021-12-01 — End: 2021-12-02
  Administered 2021-12-01 – 2021-12-02 (×3): 100 mg via ORAL
  Filled 2021-12-01 (×3): qty 1

## 2021-12-01 MED ORDER — PANTOPRAZOLE SODIUM 20 MG PO TBEC
20.0000 mg | DELAYED_RELEASE_TABLET | Freq: Every day | ORAL | Status: DC
Start: 2021-12-01 — End: 2021-12-02
  Administered 2021-12-01 – 2021-12-02 (×2): 20 mg via ORAL
  Filled 2021-12-01 (×2): qty 1

## 2021-12-01 MED ORDER — IPRATROPIUM-ALBUTEROL 0.5-2.5 (3) MG/3ML IN SOLN
3.0000 mL | RESPIRATORY_TRACT | Status: DC | PRN
Start: 2021-12-01 — End: 2021-12-02

## 2021-12-01 MED ORDER — OCUVITE-LUTEIN PO CAPS
1.0000 | ORAL_CAPSULE | Freq: Every day | ORAL | Status: DC
  Filled 2021-12-01 (×2): qty 1

## 2021-12-01 MED ORDER — ACETAMINOPHEN 325 MG PO TABS
650.0000 mg | ORAL_TABLET | Freq: Four times a day (QID) | ORAL | Status: DC | PRN
  Filled 2021-12-01: qty 2

## 2021-12-01 MED ORDER — ENSURE ENLIVE PO LIQD
237.0000 mL | Freq: Two times a day (BID) | ORAL | Status: DC
  Administered 2021-12-02: 237 mL via ORAL
  Filled 2021-12-01: qty 237

## 2021-12-01 NOTE — ED Notes (Signed)
Daughter [name] updated  

## 2021-12-01 NOTE — H&P (Addendum)
History and Physical    DOA: 12/01/2021  PCP: [facility]  Patient coming from: Home  Chief Complaint: AMS  HPI: Sharon Conway is a 88 y.o. female with history h/o  HTN, DM, hypothyroidism, diverticulosis who had a fall 4 days back recently was brought in with reports of gradual decline since.  According to daughter patient walks with a walker and slipped-landing on the concrete floor on her right side.  She apparently did complain of pain along her right side and was evaluated by PCP the same day, no imaging studies were obtained but advised to take Tylenol as needed pain.  Daughter states, since the fall, patient has been sleeping longer than usual and appeared tired during the day.  Patient however states she was okay and daughter kept waking her up even though her eyes were open.  According to daughter, this morning around 8 AM patient noted to be sleeping in the bed with her tongue stuck out, drooling and appeared to be stiff.  No seizure activity or incontinence reported.  EMS was summoned and patient was hard to arouse.  Vitals however were stable.  According to daughter, patient apparently did not move even when EMS was trying to obtain IV access.  No fever or shortness of breath noted by daughter.  Patient apparently does have chronic dry cough from her thyroid issues.  Patient takes methimazole as scheduled and has had no recent medication changes. ED course: Afebrile, BP 106/57-143/101, pulse 80-90, O2 sat 95 to 100% RA.  WBC 4.0, hemoglobin 10.9, sodium 139, potassium 4.8, BUN 19, creatinine 0.87, glucose 136, ABG showed pH 7.36, PCO2 55.7, PO2 61, bicarb 31.7, O2 sat 90%.  Patient currently denies any significant complaints but does point to right hip when asked particularly about pain.  She states that sometimes she grunts due to pain while walking.  She points to right anterior chest wall and reports pain when she takes deep breath at times.  She states  right-sided pain however is much improved since her fall.  Daughter confirmed DNR status  Review of Systems: As per HPI, otherwise review of systems negative.    Past Medical History:  Diagnosis Date   Anemia    Diabetes mellitus without complication (HCC)    type 2   Diverticulosis    GI bleed 09/2015   Hypertension       Hyperthyroidism  Past Surgical History:  Procedure Laterality Date   ABDOMINAL HYSTERECTOMY     COLONOSCOPY N/A 10/01/2015   Procedure: COLONOSCOPY;  Surgeon: Carman Ching, MD;  Location: [facility] ENDOSCOPY;  Service: Endoscopy;  Laterality: N/A;  contol of bleeding    Social history:  reports that she has never smoked. She has never used smokeless tobacco. She reports that she does not drink alcohol and does not use drugs.   No Known Allergies  No family history on file.    Prior to Admission medications   Medication Sig Start Date End Date Taking? Authorizing Provider  diclofenac Sodium (VOLTAREN) 1 % GEL Apply 1 application topically 2 (two) times daily as needed (arthritis pain).    [provider]  feeding supplement, ENSURE ENLIVE, (ENSURE ENLIVE) LIQD Take 237 mLs by mouth 2 (two) times daily between meals. Patient taking differently: Take 237 mLs by mouth daily. 10/03/15   Hyacinth Meeker, MD  gabapentin (NEURONTIN) 300 MG capsule Take 300 mg by mouth at bedtime.  07/30/21  [provider]  guaiFENesin-dextromethorphan (ROBITUSSIN DM) 100-10  MG/5ML syrup Take 10 mLs by mouth every 4 (four) hours as needed for cough. 05/20/21   Calvert Cantor, MD  Ipratropium-Albuterol (COMBIVENT) 20-100 MCG/ACT AERS respimat Inhale 1 puff into the lungs every 6 (six) hours as needed for wheezing. 05/20/21   Calvert Cantor, MD  losartan (COZAAR) 25 MG tablet Take 25 mg by mouth daily.    [provider]  metFORMIN (GLUCOPHAGE) 500 MG tablet Take 500 mg by mouth daily.    [provider]  methimazole (TAPAZOLE) 5 MG tablet Take 5 mg by mouth  daily.    [provider]  metoprolol succinate (TOPROL-XL) 25 MG 24 hr tablet Take 12.5 mg by mouth 2 (two) times daily.    [provider]  Multiple Vitamins-Minerals (PRESERVISION AREDS 2) CAPS Take 1 capsule by mouth daily.    [provider]  pantoprazole (PROTONIX) 20 MG tablet Take 20 mg by mouth daily.    [provider]  polyethylene glycol powder (GLYCOLAX/MIRALAX) 17 GM/SCOOP powder Take 17 g by mouth daily as needed for mild constipation.    [provider]    Physical Exam: Vitals:   12/01/21 1215 12/01/21 1230 12/01/21 1245 12/01/21 1302  BP: (!) 126/54 (!) 124/59 127/84   Pulse: 82 85 85   Resp: 16 17 (!) 22   Temp:      TempSrc:      SpO2: 99% 98% 95% 96%    Constitutional: NAD, calm, comfortable.  Hard of hearing but awake alert oriented x2 (stated month was December) Eyes: PERRL, lids and conjunctivae normal ENMT: Mucous membranes are moist. Posterior pharynx clear of any exudate or lesions.Normal dentition.  Neck: normal, supple, no masses, no thyromegaly Respiratory: clear to auscultation bilaterally, no wheezing, mild right sided basal crackles. Normal respiratory effort. No accessory muscle use.  Cardiovascular: Reproducible right-sided chest wall pain-lower rib/anteriorly.  Heart regular rate and rhythm, no murmurs / rubs / gallops. No extremity edema. 2+ pedal pulses. No carotid bruits.  Abdomen: no tenderness, no masses palpated. No hepatosplenomegaly. Bowel sounds positive.  Musculoskeletal: no clubbing / cyanosis. No joint deformity upper and lower extremities. Good ROM, no contractures. Normal muscle tone.  Neurologic: CN 2-12 grossly intact.  Hard of hearing.  Sensation intact, DTR normal. Strength 4/5 in all 4.  Psychiatric: Normal judgment and insight. Alert and oriented x 2 to place and person. Normal mood.  SKIN/catheters: no rashes, lesions, ulcers. No induration  Labs on Admission: I have personally reviewed  following labs and imaging studies  CBC: Recent Labs  Lab 12/01/21 0940 12/01/21 0954 12/01/21 1250  WBC 4.0  --   --   NEUTROABS 2.2  --   --   HGB 9.4* 10.9* 10.5*  HCT 32.1* 32.0* 31.0*  MCV 92.5  --   --   PLT 209  --   --    Basic Metabolic Panel: Recent Labs  Lab 12/01/21 0940 12/01/21 0954 12/01/21 1250  NA 139 139 139  K 4.8 4.8 4.7  CL 101  --   --   CO2 32  --   --   GLUCOSE 136*  --   --   BUN 19  --   --   CREATININE 0.87  --   --   CALCIUM 9.5  --   --    GFR: CrCl cannot be calculated (Unknown ideal weight.). Recent Labs  Lab 12/01/21 0940  WBC 4.0   Liver Function Tests: Recent Labs  Lab 12/01/21 0940  AST  18  ALT 12  ALKPHOS 95  BILITOT 0.3  PROT 6.7  ALBUMIN 3.3*   No results for input(s): "LIPASE", "AMYLASE" in the last 168 hours. No results for input(s): "AMMONIA" in the last 168 hours. Coagulation Profile: No results for input(s): "INR", "PROTIME" in the last 168 hours. Cardiac Enzymes: No results for input(s): "CKTOTAL", "CKMB", "CKMBINDEX", "TROPONINI" in the last 168 hours. BNP (last 3 results) No results for input(s): "PROBNP" in the last 8760 hours. HbA1C: No results for input(s): "HGBA1C" in the last 72 hours. CBG: Recent Labs  Lab 12/01/21 0924  GLUCAP 132*   Lipid Profile: No results for input(s): "CHOL", "HDL", "LDLCALC", "TRIG", "CHOLHDL", "LDLDIRECT" in the last 72 hours. Thyroid Function Tests: No results for input(s): "TSH", "T4TOTAL", "FREET4", "T3FREE", "THYROIDAB" in the last 72 hours. Anemia Panel: No results for input(s): "VITAMINB12", "FOLATE", "FERRITIN", "TIBC", "IRON", "RETICCTPCT" in the last 72 hours. Urine analysis:    Component Value Date/Time   COLORURINE YELLOW 12/01/2021 1227   APPEARANCEUR CLEAR 12/01/2021 1227   LABSPEC 1.013 12/01/2021 1227   PHURINE 5.0 12/01/2021 1227   GLUCOSEU NEGATIVE 12/01/2021 1227   HGBUR NEGATIVE 12/01/2021 1227   BILIRUBINUR NEGATIVE 12/01/2021 1227    KETONESUR NEGATIVE 12/01/2021 1227   PROTEINUR NEGATIVE 12/01/2021 1227   NITRITE NEGATIVE 12/01/2021 1227   LEUKOCYTESUR NEGATIVE 12/01/2021 1227    Radiological Exams on Admission: Personally reviewed  DG Chest Port 1 View  Result Date: 12/01/2021 CLINICAL DATA:  Altered mental status. EXAM: PORTABLE CHEST 1 VIEW COMPARISON:  Radiograph May 18, 2021. FINDINGS: Patient is rotated. Enlarged cardiac silhouette. Aortic atherosclerosis. Carotid artery calcifications. Opacity in the right lung base with a small right pleural effusion. No acute osseous abnormality. IMPRESSION: Right lung base opacity with a possible small right pleural effusion may reflect pneumonia with parapneumonic effusion. Enlarged cardiac silhouette. Electronically Signed   By: Maudry Mayhew M.D.   On: 12/01/2021 10:28   CT Head Wo Contrast  Result Date: 12/01/2021 CLINICAL DATA:  Provided history: Mental status change, unknown cause. EXAM: CT HEAD WITHOUT CONTRAST TECHNIQUE: Contiguous axial images were obtained from the base of the skull through the vertex without intravenous contrast. RADIATION DOSE REDUCTION: This exam was performed according to the departmental dose-optimization program which includes automated exposure control, adjustment of the mA and/or kV according to patient size and/or use of iterative reconstruction technique. COMPARISON:  No pertinent prior exams available for comparison. FINDINGS: Brain: Moderate cerebral atrophy. Comparatively mild cerebellar atrophy. Commensurate prominence of the ventricles and sulci. Moderate patchy and ill-defined hypoattenuation within the cerebral white matter, nonspecific but compatible with chronic small vessel ischemic disease. Chronic lacunar infarct within the anterior limb of right internal capsule (series 3, image 12). Nonspecific punctate calcification within the left insula. There is no acute intracranial hemorrhage. No demarcated cortical infarct. No extra-axial  fluid collection. No evidence of an intracranial mass. No midline shift. Vascular: No hyperdense vessel. Atherosclerotic calcifications. Skull: No fracture or aggressive osseous lesion. Sinuses/Orbits: No mass or acute finding within the imaged orbits. Mild mucosal thickening within the bilateral ethmoid and right maxillary sinuses at the imaged levels. IMPRESSION: 1. No evidence of acute intracranial abnormality. 2. Moderate chronic small vessel ischemic changes within the cerebral white matter, including a chronic lacunar infarct within the anterior limb of right internal capsule. 3. Moderate cerebral atrophy. 4. Comparatively mild cerebellar atrophy. 5. Mild paranasal sinus mucosal thickening at the imaged levels. Electronically Signed   By: Jackey Loge D.O.   On: 12/01/2021 10:23  EKG: Independently reviewed. NSR @89  HR, Wide qrs complex/IVC delay, non specific ST-T changes/motion artifact     Assessment and Plan:   Principal Problem:   Lobar pneumonia (HCC) Active Problems:   Fall at home, initial encounter   Diabetes mellitus type II, controlled (HCC)   Chronic anemia   Hypertension   Diverticulosis of colon with hemorrhage   Pneumonia   Hyperthyroidism    1.Right lower lobe infiltrate/small pleural effusion: Secondary to atelectasis versus aspiration pneumonia versus CAP.  Also reported is small right pleural effusion-not sure if could represent traumatic small hemothorax.  Watch hemoglobin.  Incentive spirometry.  Empiric antibiotics-Rocephin should cover aspiration organisms as well.  Given advanced age, will avoid azithromycin and utilize doxycycline for atypicals.  Check urine antigens for strep pneumo and Legionella.  COVID screen pending although clinical picture not consistent with viral pneumonia.  Neb treatments as needed.  O2 as needed to keep sats greater than 92%.  ABG does show mild hypercapnia/hypoxia but pH compensated.  No fever or leukocytosis.  2.  Altered mental  status: This appears to have resolved currently.  She is awake alert and communicative.  Appears cheerful.  ABG does show mild hypercapnia but not enough to cause AMS.  Head CT unremarkable. Given reports of lethargy, hold gapapentin, r/o Covid and Check TSH.  Medication list does not show any sedatives and according to daughter was only taking Tylenol for pain.  Avoid benzodiazepines.  Hold home medication, gabapentin for now.  Delirium precautions in this elderly patient.  3.  Fall: Will check hip x-ray.  Chest x-ray did not show any rib fractures.  Only mild pain with deep inspiration.  Avoid sedatives and utilize acetaminophen/lidocaine patch as needed pain. Gabapentin held for now.  PT/OT evaluation for appropriate DME and gait training.  Check orthostatic BP in am  4. Hyperthyroidism: Resume methimazole.  Check TSH  5.  Hypertension: Resume home medications and watch BP.  6.  History of GI bleed, chronic anemia: Monitor for now as discussed in problem #1.  DVT prophylaxis: Will hold off on Lovenox and concern for problem #1 and problem #6.  SCDs for now  COVID screen: Pending  Code Status:   DNR as confirmed with daughter.Health care proxy would be daughter  Patient/Family Communication: Discussed with patient/daughter and all questions answered to satisfaction.  Consults called: None Admission status :Patient will be admitted under OBSERVATION status.The patient's presenting symptoms, physical exam findings, and initial radiographic and laboratory data in the context of their medical condition is felt to place them at low risk for further clinical deterioration. Furthermore, it is anticipated that the patient will be medically stable for discharge from the hospital within 2 midnights of hospital stay.       Harriett Sine MD [facility] Pager in [place]  If 7PM-7AM, please contact night-coverage [email or web address]   12/01/2021, 1:39 PM

## 2021-12-01 NOTE — Plan of Care (Signed)

## 2021-12-01 NOTE — ED Triage Notes (Signed)
Pt arrived via [facility] from home with c/c of Unresponsiveness. Per EMS pt has only responsed with them while moving and starting IV. LKN at 2000 7/12. When Caregiver arived pt would not get up like normal or arose.   108/52, 92HR, 95% RA, 153 CBG

## 2021-12-01 NOTE — Progress Notes (Signed)
Chart reviewed for floor admission  

## 2021-12-01 NOTE — ED Provider Notes (Signed)
[facility] EMERGENCY DEPARTMENT Provider Note   CSN: [id number] Arrival date & time: 12/01/21  7619     History  Chief Complaint  Patient presents with   Fatigue    Sharon Conway is a 88 y.o. female.  Patient is a 88 year old female with a history of diabetes, anemia and hypertension who is presenting today from home with paramedics due to altered mental status.  The history was provided by the paramedics and by her daughter and caregiver.  They report that patient is mentally intact for someone who is 89, she does daily activities and is able to complete some ADLs.  She was her normal self last night when they helped her get ready for bed.  When they went to wake her up this morning she was not responding or able to be woken.  They did not note any emesis in the bed she had not had any recent cough, fever or diarrhea.  There was no new medications that was reported.  Paramedics reported stable vital signs and normal blood sugar.  Patient somnolent for them.        Home Medications Prior to Admission medications   Medication Sig Start Date End Date Taking? Authorizing Provider  diclofenac Sodium (VOLTAREN) 1 % GEL Apply 1 application topically 2 (two) times daily as needed (arthritis pain).    [provider]  feeding supplement, ENSURE ENLIVE, (ENSURE ENLIVE) LIQD Take 237 mLs by mouth 2 (two) times daily between meals. Patient taking differently: Take 237 mLs by mouth daily. 10/03/15   Hyacinth Meeker, MD  gabapentin (NEURONTIN) 300 MG capsule Take 300 mg by mouth at bedtime.  07/30/21  [provider]  guaiFENesin-dextromethorphan (ROBITUSSIN DM) 100-10 MG/5ML syrup Take 10 mLs by mouth every 4 (four) hours as needed for cough. 05/20/21   Calvert Cantor, MD  Ipratropium-Albuterol (COMBIVENT) 20-100 MCG/ACT AERS respimat Inhale 1 puff into the lungs every 6 (six) hours as needed for wheezing. 05/20/21   Calvert Cantor, MD  losartan (COZAAR) 25 MG  tablet Take 25 mg by mouth daily.    [provider]  metFORMIN (GLUCOPHAGE) 500 MG tablet Take 500 mg by mouth daily.    [provider]  methimazole (TAPAZOLE) 5 MG tablet Take 5 mg by mouth daily.    [provider]  metoprolol succinate (TOPROL-XL) 25 MG 24 hr tablet Take 12.5 mg by mouth 2 (two) times daily.    [provider]  Multiple Vitamins-Minerals (PRESERVISION AREDS 2) CAPS Take 1 capsule by mouth daily.    [provider]  pantoprazole (PROTONIX) 20 MG tablet Take 20 mg by mouth daily.    [provider]  polyethylene glycol powder (GLYCOLAX/MIRALAX) 17 GM/SCOOP powder Take 17 g by mouth daily as needed for mild constipation.    [provider]      Allergies    Patient has no known allergies.    Review of Systems   Review of Systems  Physical Exam Updated Vital Signs BP 116/60   Pulse 86   Temp 98.1 F (36.7 C) (Rectal)   Resp (!) 21   SpO2 100%  Physical Exam Vitals and nursing note reviewed.  Constitutional:      General: She is not in acute distress.    Appearance: She is well-developed.     Comments: Somnolent but with coaxing she does grin and answers a few questions but does not open her eyes  HENT:     Head: Normocephalic and atraumatic.  Eyes:     Pupils: Pupils are equal, round, and reactive to light.  Cardiovascular:     Rate and Rhythm: Normal rate and regular rhythm.     Heart sounds: Normal heart sounds. No murmur heard.    No friction rub.  Pulmonary:     Effort: Pulmonary effort is normal.     Breath sounds: Normal breath sounds. No wheezing or rales.     Comments: Coarse upper airway noise. Abdominal:     General: Bowel sounds are normal. There is no distension.     Palpations: Abdomen is soft.     Tenderness: There is no abdominal tenderness. There is no guarding or rebound.  Musculoskeletal:        General: No tenderness. Normal range of motion.     Right lower leg: No edema.      Left lower leg: No edema.     Comments: No edema  Skin:    General: Skin is warm and dry.     Findings: No rash.  Neurological:     Comments: Pt not following commands at this time.  She was noted to move the left arm minimally but was not seen moving any other extremity     ED Results / Procedures / Treatments   Labs (all labs ordered are listed, but only abnormal results are displayed) Labs Reviewed  CBC WITH DIFFERENTIAL/PLATELET - Abnormal; Notable for the following components:      Result Value   RBC 3.47 (*)    Hemoglobin 9.4 (*)    HCT 32.1 (*)    MCHC 29.3 (*)    All other components within normal limits  COMPREHENSIVE METABOLIC PANEL - Abnormal; Notable for the following components:   Glucose, Bld 136 (*)    Albumin 3.3 (*)    GFR, Estimated 59 (*)    All other components within normal limits  I-STAT VENOUS BLOOD GAS, ED - Abnormal; Notable for the following components:   pCO2, Ven 64.1 (*)    pO2, Ven 67 (*)    Bicarbonate 34.7 (*)    TCO2 37 (*)    Acid-Base Excess 7.0 (*)    HCT 32.0 (*)    Hemoglobin 10.9 (*)    All other components within normal limits  CBG MONITORING, ED - Abnormal; Notable for the following components:   Glucose-Capillary 132 (*)    All other components within normal limits  URINALYSIS, ROUTINE W REFLEX MICROSCOPIC    EKG EKG Interpretation  Date/Time:  Thursday December 01 2021 09:25:59 EDT Ventricular Rate:  89 PR Interval:  196 QRS Duration: 130 QT Interval:  367 QTC Calculation: 447 R Axis:   99 Text Interpretation: Sinus rhythm Left atrial enlargement Nonspecific intraventricular conduction delay Nonspecific T abnormalities, inferior leads No significant change since last tracing Confirmed by Gwyneth Sprout ([id number]) on 12/01/2021 9:32:17 AM  Radiology DG Chest Port 1 View  Result Date: 12/01/2021 CLINICAL DATA:  Altered mental status. EXAM: PORTABLE CHEST 1 VIEW COMPARISON:  Radiograph May 18, 2021. FINDINGS: Patient  is rotated. Enlarged cardiac silhouette. Aortic atherosclerosis. Carotid artery calcifications. Opacity in the right lung base with a small right pleural effusion. No acute osseous abnormality. IMPRESSION: Right lung base opacity with a possible small right pleural effusion may reflect pneumonia with parapneumonic effusion. Enlarged cardiac silhouette. Electronically Signed   By: Maudry Mayhew M.D.   On: 12/01/2021 10:28   CT Head Wo Contrast  Result Date: 12/01/2021 CLINICAL DATA:  Provided history: Mental status change,  unknown cause. EXAM: CT HEAD WITHOUT CONTRAST TECHNIQUE: Contiguous axial images were obtained from the base of the skull through the vertex without intravenous contrast. RADIATION DOSE REDUCTION: This exam was performed according to the departmental dose-optimization program which includes automated exposure control, adjustment of the mA and/or kV according to patient size and/or use of iterative reconstruction technique. COMPARISON:  No pertinent prior exams available for comparison. FINDINGS: Brain: Moderate cerebral atrophy. Comparatively mild cerebellar atrophy. Commensurate prominence of the ventricles and sulci. Moderate patchy and ill-defined hypoattenuation within the cerebral white matter, nonspecific but compatible with chronic small vessel ischemic disease. Chronic lacunar infarct within the anterior limb of right internal capsule (series 3, image 12). Nonspecific punctate calcification within the left insula. There is no acute intracranial hemorrhage. No demarcated cortical infarct. No extra-axial fluid collection. No evidence of an intracranial mass. No midline shift. Vascular: No hyperdense vessel. Atherosclerotic calcifications. Skull: No fracture or aggressive osseous lesion. Sinuses/Orbits: No mass or acute finding within the imaged orbits. Mild mucosal thickening within the bilateral ethmoid and right maxillary sinuses at the imaged levels. IMPRESSION: 1. No evidence of  acute intracranial abnormality. 2. Moderate chronic small vessel ischemic changes within the cerebral white matter, including a chronic lacunar infarct within the anterior limb of right internal capsule. 3. Moderate cerebral atrophy. 4. Comparatively mild cerebellar atrophy. 5. Mild paranasal sinus mucosal thickening at the imaged levels. Electronically Signed   By: Jackey Loge D.O.   On: 12/01/2021 10:23    Procedures Procedures    Medications Ordered in ED Medications  cefTRIAXone (ROCEPHIN) 1 g in sodium chloride 0.9 % 100 mL IVPB (has no administration in time range)  azithromycin (ZITHROMAX) 500 mg in sodium chloride 0.9 % 250 mL IVPB (has no administration in time range)    ED Course/ Medical Decision Making/ A&P                           Medical Decision Making Amount and/or Complexity of Data Reviewed Independent Historian: caregiver and EMS External Data Reviewed: notes.    Details: pcp notes Labs: ordered. Decision-making details documented in ED Course. Radiology: ordered and independent interpretation performed. Decision-making details documented in ED Course. ECG/medicine tests: ordered and independent interpretation performed. Decision-making details documented in ED Course.  Risk Decision regarding hospitalization.   Pt with multiple medical problems and comorbidities and presenting today with a complaint that caries a high risk for morbidity and mortality. Here with AMS.  Per family patient was her normal self last night when she went to bed but this morning she would not wake up.  EMS reported vital signs were normal but patient was somnolent and would not respond to voice or gentle stimuli.  Patient's blood sugar was within normal limits.  No recent complaints or new infectious symptoms or medication changes.  On exam patient is in no acute distress but very somnolent.  She does grin and say a few words with repetitive coaxing.  She is not appear to have any focal  findings on exam but was only noted to move her left arm a little and will not follow commands at this time.  Concern for possible hypercarbia, early infection, stroke.  Patient's daughter is on the way but did report to EMS that she is a DNR.  11:57 AM Spoke with patient's daughter Harriett Sine who lives with her and she reported earlier this week patient had a fall because she keeps her walker too far  away from her and she was reaching for it and she fell landing on her side.  She had been complaining of pain and had talked with pace but they recommended just using Tylenol.  That is the only difference in medication and she seemed okay when she went to bed last night however she could not wake her up this morning.  Patient has not had a severe cough or known fever.  I independently interpreted patient's labs and EKG today.  Patient's labs with a VBG with a normal pH and a CO2 of 64 and compensated HCO3 of 35, CBC with hemoglobin of 9.4 down 1 g since December and CMP without acute findings today.  EKG without acute findings. I have independently visualized and interpreted pt's images today.  Chest x-ray today with right lower lobe haziness.  Head CT without acute intracranial hemorrhage or stroke.  Radiology reported a right lung base opacity with small effusion concerning for possible pneumonia.  Given patient's recent fall, new change in mental status concerned that after her fall she may have developed a pneumonia.  No obvious signs of rib fracture.  Patient is more awake here however when speaking with her daughter she is not comfortable with patient coming home and would prefer treatment in the hospital.  Given patient's age and the fact that she would be at home alone as her daughter still has to be out of the house a significant amount of the day feel that it is safer for the patient for observation and she was given antibiotics.         Final Clinical Impression(s) / ED Diagnoses Final diagnoses:   Altered mental status, unspecified altered mental status type  Community acquired pneumonia of right lower lobe of lung    Rx / DC Orders ED Discharge Orders     None         Gwyneth Sprout, MD 12/01/21 1157

## 2021-12-02 DIAGNOSIS — G9341 Metabolic encephalopathy: Secondary | ICD-10-CM

## 2021-12-02 DIAGNOSIS — J189 Pneumonia, unspecified organism: Secondary | ICD-10-CM | POA: Diagnosis not present

## 2021-12-02 LAB — T4, FREE: Free T4: 0.85 ng/dL (ref 0.61–1.12)

## 2021-12-02 LAB — GLUCOSE, CAPILLARY
Glucose-Capillary: 142 mg/dL — ABNORMAL HIGH (ref 70–99)
Glucose-Capillary: 195 mg/dL — ABNORMAL HIGH (ref 70–99)
Glucose-Capillary: 103 mg/dL — ABNORMAL HIGH (ref 70–99)

## 2021-12-02 MED ORDER — PROSIGHT PO TABS
1.0000 | ORAL_TABLET | Freq: Every day | ORAL | Status: DC
Start: 2021-12-02 — End: 2021-12-02
  Administered 2021-12-02: 1 via ORAL
  Filled 2021-12-02: qty 1

## 2021-12-02 MED ORDER — CEPHALEXIN 500 MG PO CAPS: 500.0000 mg | ORAL_CAPSULE | Freq: Two times a day (BID) | ORAL | 0 refills | Status: AC

## 2021-12-02 MED ORDER — CEPHALEXIN 500 MG PO CAPS
500.0000 mg | ORAL_CAPSULE | Freq: Two times a day (BID) | ORAL | Status: DC
  Administered 2021-12-02: 500 mg via ORAL
  Filled 2021-12-02: qty 1

## 2021-12-02 MED ORDER — DOXYCYCLINE HYCLATE 100 MG PO TABS: 100.0000 mg | ORAL_TABLET | Freq: Two times a day (BID) | ORAL | 0 refills | Status: AC

## 2021-12-02 NOTE — Evaluation (Signed)
Physical Therapy Evaluation Patient Details Name: Sharon Conway MRN: [id number] DOB: 27-Jan-1919 Today's Date: 12/02/2021  History of Present Illness  88 y.o. female with who had a fall 4 days ago followed up with PCP day of fall no imaging taken, presented 7/13 with reports of gradual decline since fall with c/o R sided flank pain. Found to have right lower lobe infiltrate/small pleural effusion and altered mental status. PMH: HTN, DM, hypothyroidism, diverticulosis  Clinical Impression  Pt asleep in room, wakes easily, no family present. PTA pt living with daughter in apartment. Pt is poor historian, reports level entry, but previous hospitalization 17 steps noted. Pt reports Aide every day in morning and evening assists with bathing and dressing. Pt is currently limited in safe mobility by R sided flank pain, in presence of generalized weakness and baseline cognitive deficits. Pt is mod I for bed mobility, min guard for transfers and min A for safety ambulating with RW. PT recommending HHPT at discharge. PT will continue to follow acutely.       Recommendations for follow up therapy are one component of a multi-disciplinary discharge planning process, led by the attending physician.  Recommendations may be updated based on patient status, additional functional criteria and insurance authorization.  Follow Up Recommendations Home health PT      Assistance Recommended at Discharge Frequent or constant Supervision/Assistance  Patient can return home with the following  A little help with walking and/or transfers;A little help with bathing/dressing/bathroom;Assistance with cooking/housework;Direct supervision/assist for medications management;Direct supervision/assist for financial management;Assist for transportation;Help with stairs or ramp for entrance    Equipment Recommendations None recommended by PT (has walker and tub bench)  Recommendations for Other Services  OT consult    Functional  Status Assessment Patient has had a recent decline in their functional status and demonstrates the ability to make significant improvements in function in a reasonable and predictable amount of time.     Precautions / Restrictions Precautions Precautions: Fall Precaution Comments: fall 4 days prior to hospitalization Restrictions Weight Bearing Restrictions: No      Mobility  Bed Mobility Overal bed mobility: Modified Independent             General bed mobility comments: increased time and HoB slightly elevated    Transfers Overall transfer level: Needs assistance Equipment used: Rolling walker (2 wheels) Transfers: Sit to/from Stand Sit to Stand: Min guard           General transfer comment: min guard for safety, vc for hand placement for power up    Ambulation/Gait Ambulation/Gait assistance: Min assist Gait Distance (Feet): 20 Feet Assistive device: Rolling walker (2 wheels) Gait Pattern/deviations: Step-through pattern, Decreased step length - right, Decreased step length - left, Shuffle Gait velocity: slowed Gait velocity interpretation: <1.31 ft/sec, indicative of household ambulator   General Gait Details: light min A for safety, vc for proximity to RW        Balance Overall balance assessment: Needs assistance   Sitting balance-Leahy Scale: Normal Sitting balance - Comments: able to long sit, sit EoB without feet supported and reach behind her   Standing balance support: Bilateral upper extremity supported, During functional activity, Reliant on assistive device for balance Standing balance-Leahy Scale: Poor Standing balance comment: requires UE support for standing balance.                             Pertinent Vitals/Pain Pain Assessment Pain Assessment: Faces Faces Pain  Scale: Hurts a little bit Pain Location: R flank Pain Descriptors / Indicators: Sore, Grimacing, Guarding Pain Intervention(s): Limited activity within  patient's tolerance, Monitored during session, Repositioned    Home Living Family/patient expects to be discharged to:: Private residence Living Arrangements: Children Available Help at Discharge: Family;Available 24 hours/day Type of Home: Apartment         Home Layout: One level   Additional Comments: not a good historian, her report does not match what is in chart from last year    Prior Function Prior Level of Function : Needs assist  Cognitive Assist : ADLs (cognitive)   ADLs (Cognitive): Intermittent cues Physical Assist : Mobility (physical);ADLs (physical) Mobility (physical): Gait ADLs (physical): IADLs;Bathing;Dressing;Toileting;Grooming Mobility Comments: uses RW for ambulation at the household level ADLs Comments: pt reports Aide comes in morning and evening and assists with bathing and dressing, she thinks aide also assists daughter with cooking and cleaning     Hand Dominance        Extremity/Trunk Assessment   Upper Extremity Assessment Upper Extremity Assessment: Defer to OT evaluation    Lower Extremity Assessment Lower Extremity Assessment: Generalized weakness;Overall [facility] for tasks assessed    Cervical / Trunk Assessment Cervical / Trunk Assessment: Normal  Communication   Communication: No difficulties  Cognition Arousal/Alertness: Awake/alert Behavior During Therapy: WFL for tasks assessed/performed Overall Cognitive Status: Impaired/Different from baseline Area of Impairment: Orientation, Problem solving                 Orientation Level: Disoriented to, Place, Time, Situation           Problem Solving: Slow processing, Difficulty sequencing, Requires verbal cues General Comments: oriented only to self, can pick out hospital when given choice of hospital, church or school, when asked why she is here "if I'm in the hospital I must be sick." however can give details of sickness, requires multimodal cuing for sequencing.         General Comments General comments (skin integrity, edema, etc.): SpO2 >94%O2 at rest and after ambulation        Assessment/Plan    PT Assessment Patient needs continued PT services  PT Problem List Decreased strength;Decreased balance;Decreased mobility;Decreased cognition;Decreased safety awareness       PT Treatment Interventions DME instruction;Gait training;Stair training;Functional mobility training;Therapeutic activities;Therapeutic exercise;Balance training;Cognitive remediation;Patient/family education    PT Goals (Current goals can be found in the Care Plan section)  Acute Rehab PT Goals Patient Stated Goal: go home PT Goal Formulation: With patient Time For Goal Achievement: 12/16/21 Potential to Achieve Goals: Good    Frequency Min 3X/week     Co-evaluation               AM-PAC PT "6 Clicks" Mobility  Outcome Measure Help needed turning from your back to your side while in a flat bed without using bedrails?: None Help needed moving from lying on your back to sitting on the side of a flat bed without using bedrails?: None Help needed moving to and from a bed to a chair (including a wheelchair)?: A Little Help needed standing up from a chair using your arms (e.g., wheelchair or bedside chair)?: A Little Help needed to walk in hospital room?: A Little Help needed climbing 3-5 steps with a railing? : A Lot 6 Click Score: 19    End of Session Equipment Utilized During Treatment: Gait belt Activity Tolerance: Patient tolerated treatment well Patient left: in chair;with call bell/phone within reach;with chair alarm set Nurse  Communication: Mobility status PT Visit Diagnosis: History of falling (Z91.81);Muscle weakness (generalized) (M62.81);Difficulty in walking, not elsewhere classified (R26.2)    Time: 3086-5784 PT Time Calculation (min) (ACUTE ONLY): 34 min   Charges:   PT Evaluation $PT Eval Moderate Complexity: 1 Mod PT Treatments $Therapeutic  Activity: 8-22 mins        Merrit Friesen B. Beverely Risen PT, DPT Acute Rehabilitation Services Please use secure chat or  Call Office [phone]   [facility] 12/02/2021, 10:39 AM

## 2021-12-02 NOTE — Discharge Summary (Signed)
Physician Discharge Summary  Sharon Conway DZH:[id number] DOB: 09-12-1918 DOA: 12/01/2021  PCP: [facility]  Admit date: 12/01/2021 Discharge date: 12/02/2021  Admitted From: Home Disposition:  Home with home health   Recommendations for Outpatient Follow-up:  Follow up with PCP in 1 week  Discharge Condition: Stable CODE STATUS: DNR  Diet recommendation: Regular   Brief/Interim Summary: From H&P by Dr. Lajuana Ripple: "Sharon Conway is a 88 y.o. female with history h/o  HTN, DM, hypothyroidism, diverticulosis who had a fall 4 days back recently was brought in with reports of gradual decline since.  According to daughter patient walks with a walker and slipped-landing on the concrete floor on her right side.  She apparently did complain of pain along her right side and was evaluated by PCP the same day, no imaging studies were obtained but advised to take Tylenol as needed pain.  Daughter states, since the fall, patient has been sleeping longer than usual and appeared tired during the day.  Patient however states she was okay and daughter kept waking her up even though her eyes were open.  According to daughter, this morning around 8 AM patient noted to be sleeping in the bed with her tongue stuck out, drooling and appeared to be stiff.  No seizure activity or incontinence reported.  EMS was summoned and patient was hard to arouse.  Vitals however were stable.  According to daughter, patient apparently did not move even when EMS was trying to obtain IV access.  No fever or shortness of breath noted by daughter.  Patient apparently does have chronic dry cough from her thyroid issues.  Patient takes methimazole as scheduled and has had no recent medication changes. ED course: Afebrile, BP 106/57-143/101, pulse 80-90, O2 sat 95 to 100% RA.  WBC 4.0, hemoglobin 10.9, sodium 139, potassium 4.8, BUN 19, creatinine 0.87, glucose 136, ABG showed pH 7.36, PCO2 55.7, PO2 61, bicarb 31.7,  O2 sat 90%.  Patient currently denies any significant complaints but does point to right hip when asked particularly about pain.  She states that sometimes she grunts due to pain while walking.  She points to right anterior chest wall and reports pain when she takes deep breath at times.  She states right-sided pain however is much improved since her fall.  Daughter confirmed DNR status."  Subjective on day of discharge: Patient's mentation returned back to baseline.  Patient worked with physical therapy and was able to ambulate with a walker, likely to her baseline.  Home health was recommended.  Patient remained stable on room air.  She had no complaints of pain, shortness of breath on day of discharge.  Discharge Diagnoses:   Principal Problem:   CAP (community acquired pneumonia) Active Problems:   Fall at home, initial encounter   Diabetes mellitus type II, controlled (HCC)   Chronic anemia   Hypertension   Diverticulosis of colon with hemorrhage   Hyperthyroidism   Acute metabolic encephalopathy   Discharge Instructions  Discharge Instructions     Call MD for:  difficulty breathing, headache or visual disturbances   Complete by: As directed    Call MD for:  extreme fatigue   Complete by: As directed    Call MD for:  hives   Complete by: As directed    Call MD for:  persistant dizziness or light-headedness   Complete by: As directed    Call MD for:  persistant nausea and vomiting   Complete by: As directed  Call MD for:  severe uncontrolled pain   Complete by: As directed    Call MD for:  temperature >100.4   Complete by: As directed    Discharge instructions   Complete by: As directed    You were cared for by a hospitalist during your hospital stay. If you have any questions about your discharge medications or the care you received while you were in the hospital after you are discharged, you can call the unit and ask to speak with the hospitalist on call if the  hospitalist that took care of you is not available. Once you are discharged, your primary care physician will handle any further medical issues. Please note that NO REFILLS for any discharge medications will be authorized once you are discharged, as it is imperative that you return to your primary care physician (or establish a relationship with a primary care physician if you do not have one) for your aftercare needs so that they can reassess your need for medications and monitor your lab values.   Increase activity slowly   Complete by: As directed       Allergies as of 12/02/2021   No Known Allergies      Medication List     STOP taking these medications    gabapentin 300 MG capsule Commonly known as: NEURONTIN       TAKE these medications    Acetaminophen Extra Strength 167 MG/5ML Liqd Generic drug: Acetaminophen Take 15 mLs by mouth every 6 (six) hours as needed (osteoarthritis/headache).   albuterol 108 (90 Base) MCG/ACT inhaler Commonly known as: VENTOLIN HFA Inhale 2 puffs into the lungs every 6 (six) hours as needed for wheezing or shortness of breath.   cephALEXin 500 MG capsule Commonly known as: KEFLEX Take 1 capsule (500 mg total) by mouth every 12 (twelve) hours for 4 days.   diclofenac Sodium 1 % Gel Commonly known as: VOLTAREN Apply 1 application topically 2 (two) times daily as needed (arthritis pain).   doxycycline 100 MG tablet Commonly known as: VIBRA-TABS Take 1 tablet (100 mg total) by mouth every 12 (twelve) hours for 4 days.   famotidine 10 MG tablet Commonly known as: PEPCID Take 10 mg by mouth daily.   feeding supplement Liqd Take 237 mLs by mouth 2 (two) times daily between meals. What changed: when to take this   guaiFENesin-dextromethorphan 100-10 MG/5ML syrup Commonly known as: ROBITUSSIN DM Take 10 mLs by mouth every 4 (four) hours as needed for cough.   Ipratropium-Albuterol 20-100 MCG/ACT Aers respimat Commonly known as:  COMBIVENT Inhale 1 puff into the lungs every 6 (six) hours as needed for wheezing.   losartan 25 MG tablet Commonly known as: COZAAR Take 25 mg by mouth daily.   metFORMIN 500 MG tablet Commonly known as: GLUCOPHAGE Take 500 mg by mouth daily.   methimazole 5 MG tablet Commonly known as: TAPAZOLE Take 5 mg by mouth daily.   metoprolol succinate 25 MG 24 hr tablet Commonly known as: TOPROL-XL Take 12.5 mg by mouth 2 (two) times daily.   pantoprazole 20 MG tablet Commonly known as: PROTONIX Take 20 mg by mouth daily.   polyethylene glycol powder 17 GM/SCOOP powder Commonly known as: GLYCOLAX/MIRALAX Take 17 g by mouth daily as needed for mild constipation.   PreserVision AREDS 2 Caps Take 1 capsule by mouth daily.        Follow-up [facility], [address] [facility] [address] [facility]. Schedule an appointment as  soon as possible for a visit in 1 week(s).   Contact information: [address] [place] [address] 802 [phone]                No Known Allergies  Consultations: None    Procedures/Studies: DG HIP UNILAT WITH PELVIS 2-3 VIEWS RIGHT  Result Date: 12/01/2021 CLINICAL DATA:  Hip pain EXAM: DG HIP (WITH OR WITHOUT PELVIS) 3V RIGHT COMPARISON:  None Available. FINDINGS: No evidence of hip fracture or dislocation. Mild degenerative changes of the bilateral hips. Vascular calcifications. Degenerative changes of the partially visualized lower lumbar spine. IMPRESSION: No acute osseous abnormality. Electronically Signed   By: Allegra LaiLeah  Strickland M.D.   On: 12/01/2021 13:40   DG Chest Port 1 View  Result Date: 12/01/2021 CLINICAL DATA:  Altered mental status. EXAM: PORTABLE CHEST 1 VIEW COMPARISON:  Radiograph May 18, 2021. FINDINGS: Patient is rotated. Enlarged cardiac silhouette. Aortic atherosclerosis. Carotid artery calcifications. Opacity in the right lung base with a small right pleural effusion. No acute osseous abnormality. IMPRESSION:  Right lung base opacity with a possible small right pleural effusion may reflect pneumonia with parapneumonic effusion. Enlarged cardiac silhouette. Electronically Signed   By: Maudry MayhewJeffrey  Waltz M.D.   On: 12/01/2021 10:28   CT Head Wo Contrast  Result Date: 12/01/2021 CLINICAL DATA:  Provided history: Mental status change, unknown cause. EXAM: CT HEAD WITHOUT CONTRAST TECHNIQUE: Contiguous axial images were obtained from the base of the skull through the vertex without intravenous contrast. RADIATION DOSE REDUCTION: This exam was performed according to the departmental dose-optimization program which includes automated exposure control, adjustment of the mA and/or kV according to patient size and/or use of iterative reconstruction technique. COMPARISON:  No pertinent prior exams available for comparison. FINDINGS: Brain: Moderate cerebral atrophy. Comparatively mild cerebellar atrophy. Commensurate prominence of the ventricles and sulci. Moderate patchy and ill-defined hypoattenuation within the cerebral white matter, nonspecific but compatible with chronic small vessel ischemic disease. Chronic lacunar infarct within the anterior limb of right internal capsule (series 3, image 12). Nonspecific punctate calcification within the left insula. There is no acute intracranial hemorrhage. No demarcated cortical infarct. No extra-axial fluid collection. No evidence of an intracranial mass. No midline shift. Vascular: No hyperdense vessel. Atherosclerotic calcifications. Skull: No fracture or aggressive osseous lesion. Sinuses/Orbits: No mass or acute finding within the imaged orbits. Mild mucosal thickening within the bilateral ethmoid and right maxillary sinuses at the imaged levels. IMPRESSION: 1. No evidence of acute intracranial abnormality. 2. Moderate chronic small vessel ischemic changes within the cerebral white matter, including a chronic lacunar infarct within the anterior limb of right internal capsule. 3.  Moderate cerebral atrophy. 4. Comparatively mild cerebellar atrophy. 5. Mild paranasal sinus mucosal thickening at the imaged levels. Electronically Signed   By: Jackey LogeKyle  Golden D.O.   On: 12/01/2021 10:23       Discharge Exam: Vitals:   12/02/21 0604 12/02/21 0820  BP: (!) 123/53 (!) 121/49  Pulse: 86 85  Resp: 20 17  Temp: 98.1 F (36.7 C) 97.8 F (36.6 C)  SpO2: 96% 99%    General: Pt is alert, awake, not in acute distress Cardiovascular: RRR, S1/S2 +, no edema, +murmur  Respiratory: CTA bilaterally, no wheezing, no rhonchi, no respiratory distress, no conversational dyspnea, on room air  Abdominal: Soft, NT, ND, bowel sounds + Extremities: no edema, no cyanosis Psych: Normal mood and affect   The results of significant diagnostics from this hospitalization (including imaging, microbiology, ancillary and laboratory) are listed below  for reference.     Microbiology: Recent Results (from the past 240 hour(s))  SARS Coronavirus 2 by RT PCR (hospital order, performed in [facility] hospital lab) *cepheid single result test* Anterior Nasal Swab     Status: None   Collection Time: 12/01/21 12:38 PM   Specimen: Anterior Nasal Swab  Result Value Ref Range Status   SARS Coronavirus 2 by RT PCR NEGATIVE NEGATIVE Final    Comment: (NOTE) SARS-CoV-2 target nucleic acids are NOT DETECTED.  The SARS-CoV-2 RNA is generally detectable in upper and lower respiratory specimens during the acute phase of infection. The lowest concentration of SARS-CoV-2 viral copies this assay can detect is 250 copies / mL. A negative result does not preclude SARS-CoV-2 infection and should not be used as the sole basis for treatment or other patient management decisions.  A negative result may occur with improper specimen collection / handling, submission of specimen other than nasopharyngeal swab, presence of viral mutation(s) within the areas targeted by this assay, and inadequate number of viral  copies (<250 copies / mL). A negative result must be combined with clinical observations, patient history, and epidemiological information.  Fact Sheet for Patients:   RoadLapTop.co.za  Fact Sheet for Healthcare Providers: http://kim-miller.com/  This test is not yet approved or  cleared by the [place] FDA and has been authorized for detection and/or diagnosis of SARS-CoV-2 by FDA under an Emergency Use Authorization (EUA).  This EUA will remain in effect (meaning this test can be used) for the duration of the COVID-19 declaration under Section 564(b)(1) of the Act, 21 U.S.C. section 360bbb-3(b)(1), unless the authorization is terminated or revoked sooner.  Performed at [facility], [address] [place], [place] [address]      Labs: BNP (last 3 results) No results for input(s): "BNP" in the last 8760 hours. Basic Metabolic Panel: Recent Labs  Lab 12/01/21 0940 12/01/21 0954 12/01/21 1250  NA 139 139 139  K 4.8 4.8 4.7  CL 101  --   --   CO2 32  --   --   GLUCOSE 136*  --   --   BUN 19  --   --   CREATININE 0.87  --   --   CALCIUM 9.5  --   --    Liver Function Tests: Recent Labs  Lab 12/01/21 0940  AST 18  ALT 12  ALKPHOS 95  BILITOT 0.3  PROT 6.7  ALBUMIN 3.3*   No results for input(s): "LIPASE", "AMYLASE" in the last 168 hours. No results for input(s): "AMMONIA" in the last 168 hours. CBC: Recent Labs  Lab 12/01/21 0940 12/01/21 0954 12/01/21 1250  WBC 4.0  --   --   NEUTROABS 2.2  --   --   HGB 9.4* 10.9* 10.5*  HCT 32.1* 32.0* 31.0*  MCV 92.5  --   --   PLT 209  --   --    Cardiac Enzymes: No results for input(s): "CKTOTAL", "CKMB", "CKMBINDEX", "TROPONINI" in the last 168 hours. BNP: Invalid input(s): "POCBNP" CBG: Recent Labs  Lab 12/01/21 0924 12/01/21 1643 12/01/21 2237 12/02/21 0812 12/02/21 1142  GLUCAP 132* 115* 248* 142* 195*   D-Dimer No results for input(s):  "DDIMER" in the last 72 hours. Hgb A1c Recent Labs    12/01/21 1530  HGBA1C 8.1*   Lipid Profile No results for input(s): "CHOL", "HDL", "LDLCALC", "TRIG", "CHOLHDL", "LDLDIRECT" in the last 72 hours. Thyroid function studies Recent Labs  12/01/21 1253  TSH 9.396*   Anemia work up No results for input(s): "VITAMINB12", "FOLATE", "FERRITIN", "TIBC", "IRON", "RETICCTPCT" in the last 72 hours. Urinalysis    Component Value Date/Time   COLORURINE YELLOW 12/01/2021 1227   APPEARANCEUR CLEAR 12/01/2021 1227   LABSPEC 1.013 12/01/2021 1227   PHURINE 5.0 12/01/2021 1227   GLUCOSEU NEGATIVE 12/01/2021 1227   HGBUR NEGATIVE 12/01/2021 1227   BILIRUBINUR NEGATIVE 12/01/2021 1227   KETONESUR NEGATIVE 12/01/2021 1227   PROTEINUR NEGATIVE 12/01/2021 1227   NITRITE NEGATIVE 12/01/2021 1227   LEUKOCYTESUR NEGATIVE 12/01/2021 1227   Sepsis Labs Recent Labs  Lab 12/01/21 0940  WBC 4.0   Microbiology Recent Results (from the past 240 hour(s))  SARS Coronavirus 2 by RT PCR (hospital order, performed in [facility] hospital lab) *cepheid single result test* Anterior Nasal Swab     Status: None   Collection Time: 12/01/21 12:38 PM   Specimen: Anterior Nasal Swab  Result Value Ref Range Status   SARS Coronavirus 2 by RT PCR NEGATIVE NEGATIVE Final    Comment: (NOTE) SARS-CoV-2 target nucleic acids are NOT DETECTED.  The SARS-CoV-2 RNA is generally detectable in upper and lower respiratory specimens during the acute phase of infection. The lowest concentration of SARS-CoV-2 viral copies this assay can detect is 250 copies / mL. A negative result does not preclude SARS-CoV-2 infection and should not be used as the sole basis for treatment or other patient management decisions.  A negative result may occur with improper specimen collection / handling, submission of specimen other than nasopharyngeal swab, presence of viral mutation(s) within the areas targeted by this assay, and  inadequate number of viral copies (<250 copies / mL). A negative result must be combined with clinical observations, patient history, and epidemiological information.  Fact Sheet for Patients:   RoadLapTop.co.za  Fact Sheet for Healthcare Providers: http://kim-miller.com/  This test is not yet approved or  cleared by the [place] FDA and has been authorized for detection and/or diagnosis of SARS-CoV-2 by FDA under an Emergency Use Authorization (EUA).  This EUA will remain in effect (meaning this test can be used) for the duration of the COVID-19 declaration under Section 564(b)(1) of the Act, 21 U.S.C. section 360bbb-3(b)(1), unless the authorization is terminated or revoked sooner.  Performed at [facility], [address] [place], [place] [address]      Patient was seen and examined on the day of discharge and was found to be in stable condition. Time coordinating discharge: 40 minutes including assessment and coordination of care, as well as examination of the patient.   SIGNED:  Noralee Stain, DO [facility] 12/02/2021, 12:40 PM

## 2021-12-02 NOTE — Care Management Obs Status (Signed)
MEDICARE OBSERVATION STATUS NOTIFICATION   Patient Details  Name: Sharon Conway MRN: [id number] Date of Birth: 07-05-1918   Medicare Observation Status Notification Given:  Yes    Tom-Johnson, Hershal Coria, RN 12/02/2021, 1:41 PM

## 2021-12-02 NOTE — Progress Notes (Signed)
[facility] cant transport patient, Pt's daughter will pick her up around 3pm.

## 2021-12-02 NOTE — Progress Notes (Signed)
PHARMACY NOTE:  ANTIMICROBIAL RENAL DOSAGE ADJUSTMENT  Current antimicrobial regimen includes a mismatch between antimicrobial dosage and estimated renal function.  As per policy approved by the Pharmacy & Therapeutics and Medical Executive Committees, the antimicrobial dosage will be adjusted accordingly.  Current antimicrobial dosage:  Cephalexin 500mg  PO q6h  Indication: CAP  Renal Function: CrCl ~ 30 ml/min  CrCl cannot be calculated (Unknown ideal weight.). []      On intermittent HD, scheduled: []      On CRRT    Antimicrobial dosage has been changed to:  Cephalexin 500mg  PO q12h   Thank you for allowing pharmacy to be a part of this patient's care.  , [facility] 12/02/2021 12:36 PM

## 2021-12-02 NOTE — Plan of Care (Signed)

## 2021-12-02 NOTE — TOC Transition Note (Signed)
Transition of Care [facility]) - CM/SW Discharge Note   Patient Details  Name: Sharon Conway MRN: [id number] Date of Birth: 1919/01/25  Transition of Care [facility]) CM/SW Contact:  Tom-Johnson, Hershal Coria, RN Phone Number: 12/02/2021, 1:43 PM   Clinical Narrative:     Patient is scheduled for discharge today. Home health PT/OT recommended. Patient has [employer]. CM spoke with Kennith Center [phone]) at [facility]. Tracey states [facility] will provide PT/OT. Patient's daughter Sharon Conway to transport at discharge. No further TOC needs noted.   Final next level of care: Home w Home Health Services Barriers to Discharge: Barriers Resolved   Patient Goals and CMS Choice Patient states their goals for this hospitalization and ongoing recovery are:: To return home with daughter, Sharon Conway. CMS Medicare.gov Compare Post Acute Care list provided to:: Patient Choice offered to / list presented to : Patient, Adult Children Sharon Conway)  Discharge Placement                Patient to be transferred to facility by: Daughter, Sharon Conway      Discharge Plan and Services                DME Arranged: N/A DME Agency: NA       HH Arranged: PT, OT HH Agency: Other - See comment ([facility]) Date HH Agency Contacted: 12/02/21 Time HH Agency Contacted: 1330 Representative spoke with at [facility] Agency: Angelica Chessman  Social Determinants of Health (SDOH) Interventions     Readmission Risk Interventions     No data to display

## 2021-12-07 ENCOUNTER — Emergency Department (HOSPITAL_COMMUNITY): Payer: Medicare (Managed Care)

## 2021-12-07 ENCOUNTER — Encounter (HOSPITAL_COMMUNITY): Payer: Self-pay | Admitting: Emergency Medicine

## 2021-12-07 ENCOUNTER — Other Ambulatory Visit: Payer: Self-pay

## 2021-12-07 ENCOUNTER — Emergency Department (HOSPITAL_COMMUNITY)
Admission: EM | Admit: 2021-12-07 | Discharge: 2021-12-07 | Disposition: A | Discharge status: Home / Self Care | Payer: Medicare (Managed Care) | Attending: Emergency Medicine | Admitting: Emergency Medicine

## 2021-12-07 DIAGNOSIS — I10 Essential (primary) hypertension: Secondary | ICD-10-CM | POA: Diagnosis not present

## 2021-12-07 DIAGNOSIS — Z79899 Other long term (current) drug therapy: Secondary | ICD-10-CM | POA: Diagnosis not present

## 2021-12-07 DIAGNOSIS — R4182 Altered mental status, unspecified: Secondary | ICD-10-CM | POA: Diagnosis present

## 2021-12-07 DIAGNOSIS — R5383 Other fatigue: Secondary | ICD-10-CM | POA: Diagnosis not present

## 2021-12-07 DIAGNOSIS — E119 Type 2 diabetes mellitus without complications: Secondary | ICD-10-CM | POA: Diagnosis not present

## 2021-12-07 DIAGNOSIS — Z7984 Long term (current) use of oral hypoglycemic drugs: Secondary | ICD-10-CM | POA: Diagnosis not present

## 2021-12-07 LAB — COMPREHENSIVE METABOLIC PANEL
ALT: 11 U/L (ref 0–44)
AST: 28 U/L (ref 15–41)
Albumin: 3.4 g/dL — ABNORMAL LOW (ref 3.5–5.0)
Alkaline Phosphatase: 69 U/L (ref 38–126)
Anion gap: 8 (ref 5–15)
BUN: 21 mg/dL (ref 8–23)
CO2: 29 mmol/L (ref 22–32)
Calcium: 9.3 mg/dL (ref 8.9–10.3)
Chloride: 100 mmol/L (ref 98–111)
Creatinine, Ser: 0.95 mg/dL (ref 0.44–1.00)
GFR, Estimated: 53 mL/min — ABNORMAL LOW (ref >60)
Glucose, Bld: 134 mg/dL — ABNORMAL HIGH (ref 70–99)
Potassium: 4.9 mmol/L (ref 3.5–5.1)
Sodium: 137 mmol/L (ref 135–145)
Total Bilirubin: 0.8 mg/dL (ref 0.3–1.2)
Total Protein: 6.3 g/dL — ABNORMAL LOW (ref 6.5–8.1)

## 2021-12-07 LAB — RAPID URINE DRUG SCREEN, HOSP PERFORMED
Amphetamines: NOT DETECTED
Barbiturates: NOT DETECTED
Benzodiazepines: NOT DETECTED
Cocaine: NOT DETECTED
Opiates: NOT DETECTED
Tetrahydrocannabinol: NOT DETECTED

## 2021-12-07 LAB — CBC WITH DIFFERENTIAL/PLATELET
Abs Immature Granulocytes: 0.01 10*3/uL (ref 0.00–0.07)
Basophils Absolute: 0 10*3/uL (ref 0.0–0.1)
Basophils Relative: 0 %
Eosinophils Absolute: 0.1 10*3/uL (ref 0.0–0.5)
Eosinophils Relative: 1 %
HCT: 34.9 % — ABNORMAL LOW (ref 36.0–46.0)
Hemoglobin: 10.6 g/dL — ABNORMAL LOW (ref 12.0–15.0)
Immature Granulocytes: 0 %
Lymphocytes Relative: 34 %
Lymphs Abs: 1.5 10*3/uL (ref 0.7–4.0)
MCH: 27 pg (ref 26.0–34.0)
MCHC: 30.4 g/dL (ref 30.0–36.0)
MCV: 89 fL (ref 80.0–100.0)
Monocytes Absolute: 0.5 10*3/uL (ref 0.1–1.0)
Monocytes Relative: 11 %
Neutro Abs: 2.4 10*3/uL (ref 1.7–7.7)
Neutrophils Relative %: 54 %
Platelets: 231 10*3/uL (ref 150–400)
RBC: 3.92 MIL/uL (ref 3.87–5.11)
RDW: 13.1 % (ref 11.5–15.5)
WBC: 4.6 10*3/uL (ref 4.0–10.5)
nRBC: 0 % (ref 0.0–0.2)

## 2021-12-07 LAB — TROPONIN I (HIGH SENSITIVITY): Troponin I (High Sensitivity): 14 ng/L (ref <18)

## 2021-12-07 LAB — TSH: TSH: 7.57 u[IU]/mL — ABNORMAL HIGH (ref 0.350–4.500)

## 2021-12-07 LAB — URINALYSIS, ROUTINE W REFLEX MICROSCOPIC
Bilirubin Urine: NEGATIVE
Glucose, UA: NEGATIVE mg/dL
Hgb urine dipstick: NEGATIVE
Ketones, ur: NEGATIVE mg/dL
Leukocytes,Ua: NEGATIVE
Nitrite: NEGATIVE
Protein, ur: NEGATIVE mg/dL
Specific Gravity, Urine: 1.01 (ref 1.005–1.030)
pH: 6 (ref 5.0–8.0)

## 2021-12-07 LAB — ETHANOL: Alcohol, Ethyl (B): <10 mg/dL (ref <10)

## 2021-12-07 LAB — ACETAMINOPHEN LEVEL: Acetaminophen (Tylenol), Serum: <10 ug/mL — ABNORMAL LOW (ref 10–30)

## 2021-12-07 LAB — SALICYLATE LEVEL: Salicylate Lvl: <7 mg/dL — ABNORMAL LOW (ref 7.0–30.0)

## 2021-12-07 LAB — AMMONIA: Ammonia: 16 umol/L (ref 9–35)

## 2021-12-07 MED ORDER — SODIUM CHLORIDE 0.9 % IV BOLUS
500.0000 mL | Freq: Once | INTRAVENOUS | Status: AC
  Administered 2021-12-07: 500 mL via INTRAVENOUS

## 2021-12-07 NOTE — ED Triage Notes (Addendum)
Family called ems for decrease in LOC. More so concern that the pt is not responding like she normally does. Pt recently seen and was on abx. Last dose last night that ems states family stated that she choked on. Pt follows commands and will smirk but doesn't communicate back at this time. Fine crackles noted in the left upper chest. Right side clear throughout  Per ems family also hasn't been herself since being sick last week

## 2021-12-07 NOTE — Discharge Instructions (Signed)
It was a pleasure caring for you today in the emergency department.  Please return to the emergency department for any worsening or worrisome symptoms.  Please follow-up with your primary care doctor

## 2021-12-07 NOTE — ED Provider Notes (Signed)
[facility] EMERGENCY DEPARTMENT Provider Note   CSN: [id number] Arrival date & time: 12/07/21  1047     History  Chief Complaint  Patient presents with  . Altered Mental Status    Sharon Conway is a 88 y.o. female.  Patient as above with significant medical history as below, including DM, hypertension, anemia who presents to the ED with complaint of AMS.  Patient was recently hospitalized 7/13 secondary to AMS secondary to right-sided pneumonia, mild hypercapnia potentially factor.  She was discharged in stable condition.  Has completed her oral antibiotics for pneumonia.  Family reports patient has not returned to baseline since being discharged in the hospital.  They feel her mental status mildly worsened last few days, decreased responsiveness. Had some difficulty swallowing her antibiotics yestd.  Last known normal was greater than 24 hours ago.  No falls reported.  No vomiting  On my assessment patient has no acute complaints, she denies chest pain, dyspnea, abdominal pain, nausea or vomiting.     Past Medical History:  Diagnosis Date  . Anemia   . Diabetes mellitus without complication (HCC)    type 2  . Diverticulosis   . GI bleed 09/2015  . Hypertension     Past Surgical History:  Procedure Laterality Date  . ABDOMINAL HYSTERECTOMY    . COLONOSCOPY N/A 10/01/2015   Procedure: COLONOSCOPY;  Surgeon: Carman Ching, MD;  Location: [facility] ENDOSCOPY;  Service: Endoscopy;  Laterality: N/A;  contol of bleeding     The history is provided by the EMS personnel and the patient. No language interpreter was used.  Altered Mental Status Associated symptoms: no abdominal pain, no fever, no headaches, no nausea, no palpitations and no rash        Home Medications Prior to Admission medications   Medication Sig Start Date End Date Taking? Authorizing Provider  Acetaminophen (ACETAMINOPHEN EXTRA STRENGTH) 167 MG/5ML LIQD Take 15 mLs by mouth every 6 (six) hours as  needed (osteoarthritis/headache).    [provider]  albuterol (VENTOLIN HFA) 108 (90 Base) MCG/ACT inhaler Inhale 2 puffs into the lungs every 6 (six) hours as needed for wheezing or shortness of breath.    [provider]  diclofenac Sodium (VOLTAREN) 1 % GEL Apply 1 application topically 2 (two) times daily as needed (arthritis pain).    [provider]  famotidine (PEPCID) 10 MG tablet Take 10 mg by mouth daily.    [provider]  feeding supplement, ENSURE ENLIVE, (ENSURE ENLIVE) LIQD Take 237 mLs by mouth 2 (two) times daily between meals. Patient taking differently: Take 237 mLs by mouth daily. 10/03/15   Ahmed, Elyn Peers, MD  guaiFENesin-dextromethorphan (ROBITUSSIN DM) 100-10 MG/5ML syrup Take 10 mLs by mouth every 4 (four) hours as needed for cough. 05/20/21   Calvert Cantor, MD  Ipratropium-Albuterol (COMBIVENT) 20-100 MCG/ACT AERS respimat Inhale 1 puff into the lungs every 6 (six) hours as needed for wheezing. 05/20/21   Calvert Cantor, MD  losartan (COZAAR) 25 MG tablet Take 25 mg by mouth daily.    [provider]  metFORMIN (GLUCOPHAGE) 500 MG tablet Take 500 mg by mouth daily.    [provider]  methimazole (TAPAZOLE) 5 MG tablet Take 5 mg by mouth daily.    [provider]  metoprolol succinate (TOPROL-XL) 25 MG 24 hr tablet Take 12.5 mg by mouth 2 (two) times daily.    [provider]  Multiple Vitamins-Minerals (PRESERVISION AREDS 2) CAPS Take 1 capsule by mouth daily.  [provider]  pantoprazole (PROTONIX) 20 MG tablet Take 20 mg by mouth daily.    [provider]  polyethylene glycol powder (GLYCOLAX/MIRALAX) 17 GM/SCOOP powder Take 17 g by mouth daily as needed for mild constipation.    [provider]      Allergies    Patient has no known allergies.    Review of Systems   Review of Systems  Constitutional:  Negative for activity change and fever.  HENT:  Negative for  facial swelling and trouble swallowing.   Eyes:  Negative for discharge and redness.  Respiratory:  Negative for cough and shortness of breath.   Cardiovascular:  Negative for chest pain and palpitations.  Gastrointestinal:  Negative for abdominal pain and nausea.  Genitourinary:  Negative for dysuria and flank pain.  Musculoskeletal:  Negative for back pain and gait problem.  Skin:  Negative for pallor and rash.  Neurological:  Negative for syncope and headaches.    Physical Exam Updated Vital Signs BP (!) 136/53   Pulse 73   Temp 97.9 F (36.6 C) (Oral)   Resp 18   SpO2 98%  Physical Exam Vitals and nursing note reviewed. Exam conducted with a chaperone present.  Constitutional:      General: She is not in acute distress.    Appearance: She is not diaphoretic.     Comments: Frail  HENT:     Head: Normocephalic and atraumatic.     Right Ear: External ear normal.     Left Ear: External ear normal.     Nose: Nose normal.     Mouth/Throat:     Mouth: Mucous membranes are moist.  Eyes:     General: No scleral icterus.       Right eye: No discharge.        Left eye: No discharge.     Extraocular Movements: Extraocular movements intact.     Conjunctiva/sclera: Conjunctivae normal.     Pupils: Pupils are equal, round, and reactive to light.  Neck:     Vascular: No JVD.  Cardiovascular:     Rate and Rhythm: Normal rate and regular rhythm.     Pulses: Normal pulses.     Heart sounds: Normal heart sounds.  Pulmonary:     Effort: Pulmonary effort is normal. No respiratory distress.     Breath sounds: Normal breath sounds.  Abdominal:     General: Abdomen is flat.     Palpations: Abdomen is soft.     Tenderness: There is no abdominal tenderness. There is no guarding or rebound.  Musculoskeletal:        General: Normal range of motion.     Cervical back: Normal range of motion.     Right lower leg: No edema.     Left lower leg: No edema.  Skin:    General: Skin is warm  and dry.     Capillary Refill: Capillary refill takes less than 2 seconds.  Neurological:     General: No focal deficit present.     Mental Status: She is alert and oriented to person, place, and time.     GCS: GCS eye subscore is 4. GCS verbal subscore is 4. GCS motor subscore is 6.     Cranial Nerves: Cranial nerves 2-12 are intact.     Sensory: Sensation is intact.     Motor: Motor function is intact. No weakness or tremor.     Coordination: Coordination is intact.  Comments: Patient responds appropriately to yes/no questions.  She is unable to tell her name, give date or location.  Gait not tested secondary to patient safety  Strength equal b/l UE 5/5 Strength equal b/l LE 5/5  Psychiatric:        Mood and Affect: Mood normal.        Behavior: Behavior normal.     ED Results / Procedures / Treatments   Labs (all labs ordered are listed, but only abnormal results are displayed) Labs Reviewed  CBC WITH DIFFERENTIAL/PLATELET - Abnormal; Notable for the following components:      Result Value   Hemoglobin 10.6 (*)    HCT 34.9 (*)    All other components within normal limits  COMPREHENSIVE METABOLIC PANEL - Abnormal; Notable for the following components:   Glucose, Bld 134 (*)    Total Protein 6.3 (*)    Albumin 3.4 (*)    GFR, Estimated 53 (*)    All other components within normal limits  TSH - Abnormal; Notable for the following components:   TSH 7.570 (*)    All other components within normal limits  SALICYLATE LEVEL - Abnormal; Notable for the following components:   Salicylate Lvl <7.0 (*)    All other components within normal limits  ACETAMINOPHEN LEVEL - Abnormal; Notable for the following components:   Acetaminophen (Tylenol), Serum <10 (*)    All other components within normal limits  URINALYSIS, ROUTINE W REFLEX MICROSCOPIC  AMMONIA  ETHANOL  RAPID URINE DRUG SCREEN, HOSP PERFORMED  BLOOD GAS, VENOUS  CBG MONITORING, ED  TROPONIN I (HIGH SENSITIVITY)   TROPONIN I (HIGH SENSITIVITY)    EKG EKG Interpretation  Date/Time:  Wednesday December 07 2021 13:40:30 EDT Ventricular Rate:  81 PR Interval:  201 QRS Duration: 137 QT Interval:  417 QTC Calculation: 485 R Axis:   88 Text Interpretation: Sinus rhythm Left atrial enlargement Right bundle branch block No significant change since last tracing Confirmed by Tanda Rockers ([id number]) on 12/07/2021 2:54:59 PM  Radiology CT Head Wo Contrast  Result Date: 12/07/2021 CLINICAL DATA:  Delirium EXAM: CT HEAD WITHOUT CONTRAST TECHNIQUE: Contiguous axial images were obtained from the base of the skull through the vertex without intravenous contrast. RADIATION DOSE REDUCTION: This exam was performed according to the departmental dose-optimization program which includes automated exposure control, adjustment of the mA and/or kV according to patient size and/or use of iterative reconstruction technique. COMPARISON:  None Available. FINDINGS: Motion limited. Brain: No evidence of acute infarction, hemorrhage, hydrocephalus, extra-axial collection or mass lesion/mass effect. Patchy white matter hypodensities, nonspecific but compatible with chronic microvascular ischemic disease. Vascular: No hyperdense vessel identified. Skull: No acute fracture. Sinuses/Orbits: Visualized sinuses are clear. No acute orbital findings. Other: No mastoid effusions. IMPRESSION: Motion limited study without evidence of acute intracranial abnormality. Electronically Signed   By: Feliberto Harts M.D.   On: 12/07/2021 12:11   DG Chest Portable 1 View  Result Date: 12/07/2021 CLINICAL DATA:  A 68 in 76-year-old female presents for evaluation of altered mental status following fall. EXAM: PORTABLE CHEST 1 VIEW COMPARISON:  December 01, 2021. FINDINGS: Marked cardiomegaly similar to previous imaging, accentuated by AP projection. Cardiomediastinal contours are stable. Skin folds project over the RIGHT chest. No lobar consolidation. No signs of  pleural effusion or pneumothorax. Osteopenia. On limited assessment no acute skeletal findings. IMPRESSION: Cardiomegaly without acute cardiopulmonary disease. Electronically Signed   By: Donzetta Kohut M.D.   On: 12/07/2021 11:34    Procedures Procedures  Medications Ordered in ED Medications  sodium chloride 0.9 % bolus 500 mL (500 mLs Intravenous New Bag/Given 12/07/21 1447)    ED Course/ Medical Decision Making/ A&P Clinical Course as of 12/07/21 1600  Wed Dec 07, 2021  1410 CTH/ CXR reviewed, I viewed images; reports not crossing over; no acute findings  [SG]  1559 Hemoglobin(!): 10.6 Similar to her baseline [SG]  1559 Albumin(!): 3.4 Similar to her baseline [SG]  1559 TSH(!): 7.570 Improved from prior [SG]    Clinical Course User Index [SG] Sloan Leiter, DO                           Medical Decision Making Amount and/or Complexity of Data Reviewed Labs: ordered. Decision-making details documented in ED Course. Radiology: ordered. ECG/medicine tests: ordered.    CC: ams  This patient presents to the Emergency Department for the above complaint. This involves an extensive number of treatment options and is a complaint that carries with it a high risk of complications and morbidity. Vital signs were reviewed. Serious etiologies considered.  Differential diagnoses for altered mental status includes but is not exclusive to alcohol, illicit or prescription medications, intracranial pathology such as stroke, intracerebral hemorrhage, fever or infectious causes including sepsis, hypoxemia, uremia, trauma, endocrine related disorders such as diabetes, hypoglycemia, thyroid-related diseases, etc.  Record review:  Previous records obtained and reviewed prior ed visits, prior labs/imaging  Additional history obtained from EMS [place] Daughter [id number]   [phone]   Medical and surgical history as noted above.   Work up as above, notable for:  Labs & imaging  results that were available during my care of the patient were visualized by me and considered in my medical decision making.  Physical exam as above.   I ordered imaging studies which included CXR/CTH. I visualized the imaging, interpreted images, and I agree with radiologist interpretation.  No acute abnormalities  Cardiac monitoring reviewed and interpreted personally which shows NSR  Labs reviewed and are stable  Management: IV fluids  ED Course: Clinical Course as of 12/07/21 1600  Wed Dec 07, 2021  1410 CTH/ CXR reviewed, I viewed images; reports not crossing over; no acute findings  [SG]  1559 Hemoglobin(!): 10.6 Similar to her baseline [SG]  1559 Albumin(!): 3.4 Similar to her baseline [SG]  1559 TSH(!): 7.570 Improved from prior [SG]    Clinical Course User Index [SG] Sloan Leiter, DO     Reassessment:  Patient feeling better, she feels like she is back to her baseline.  She is asking to go home.  She is tolerant p.o. intake without difficulty.  No nausea, vomiting or abdominal pain.  No chest pain.  She is HDS.  She is breathing complaint ambient air.  She has no acute complaints at this time.  Spoke with patient's daughter Harriett Sine on the telephone, she will come to pick her up.  Advise close follow-up PCP, also follow-up PCP regarding Synthroid and continued surveillance of TSH level  Admission was considered.    The patient improved significantly and was discharged in stable condition. Detailed discussions were had with the patient regarding current findings, and need for close f/u with PCP or on call doctor. The patient has been instructed to return immediately if the symptoms worsen in any way for re-evaluation. Patient verbalized understanding and is in agreement with current care plan. All questions answered prior to discharge.  Social determinants of health include -  Social History   Socioeconomic History  . Marital status: Single     Spouse name: Not on file  . Number of children: Not on file  . Years of education: Not on file  . Highest education level: Not on file  Occupational History  . Not on file  Tobacco Use  . Smoking status: Never  . Smokeless tobacco: Never  Vaping Use  . Vaping Use: Never used  Substance and Sexual Activity  . Alcohol use: No    Alcohol/week: 0.0 standard drinks of alcohol  . Drug use: No  . Sexual activity: Not on file  Other Topics Concern  . Not on file  Social History Narrative  . Not on file   Social Determinants of Health   Financial Resource Strain: Not on file  Food Insecurity: Not on file  Transportation Needs: Not on file  Physical Activity: Not on file  Stress: Not on file  Social Connections: Not on file  Intimate Partner Violence: Not on file      This chart was dictated using voice recognition software.  Despite best efforts to proofread,  errors can occur which can change the documentation meaning.          Final Clinical Impression(s) / ED Diagnoses Final diagnoses:  Other fatigue    Rx / DC Orders ED Discharge Orders     None         Sloan LeiterGray, Sonja Manseau A, DO 12/07/21 1600

## 2021-12-07 NOTE — ED Notes (Signed)
Patient is resting comfortably. 

## 2022-12-14 ENCOUNTER — Emergency Department (HOSPITAL_COMMUNITY): Payer: Medicare (Managed Care)

## 2022-12-14 ENCOUNTER — Emergency Department (HOSPITAL_COMMUNITY)
Admission: EM | Admit: 2022-12-14 | Discharge: 2022-12-14 | Disposition: A | Discharge status: Home / Self Care | Payer: Medicare (Managed Care) | Attending: Emergency Medicine | Admitting: Emergency Medicine

## 2022-12-14 DIAGNOSIS — R410 Disorientation, unspecified: Secondary | ICD-10-CM | POA: Diagnosis not present

## 2022-12-14 DIAGNOSIS — Z7984 Long term (current) use of oral hypoglycemic drugs: Secondary | ICD-10-CM | POA: Insufficient documentation

## 2022-12-14 DIAGNOSIS — E039 Hypothyroidism, unspecified: Secondary | ICD-10-CM | POA: Diagnosis not present

## 2022-12-14 DIAGNOSIS — R400 Somnolence: Secondary | ICD-10-CM | POA: Insufficient documentation

## 2022-12-14 LAB — COMPREHENSIVE METABOLIC PANEL
ALT: 20 U/L (ref 0–44)
AST: 24 U/L (ref 15–41)
Albumin: 3.5 g/dL (ref 3.5–5.0)
Alkaline Phosphatase: 79 U/L (ref 38–126)
Anion gap: 8 (ref 5–15)
BUN: 22 mg/dL (ref 8–23)
CO2: 29 mmol/L (ref 22–32)
Calcium: 9.4 mg/dL (ref 8.9–10.3)
Chloride: 98 mmol/L (ref 98–111)
Creatinine, Ser: 1.05 mg/dL — ABNORMAL HIGH (ref 0.44–1.00)
GFR, Estimated: 47 mL/min — ABNORMAL LOW (ref >60)
Glucose, Bld: 123 mg/dL — ABNORMAL HIGH (ref 70–99)
Potassium: 5 mmol/L (ref 3.5–5.1)
Sodium: 135 mmol/L (ref 135–145)
Total Bilirubin: 0.6 mg/dL (ref 0.3–1.2)
Total Protein: 7 g/dL (ref 6.5–8.1)

## 2022-12-14 LAB — URINALYSIS, W/ REFLEX TO CULTURE (INFECTION SUSPECTED)
Bilirubin Urine: NEGATIVE
Glucose, UA: NEGATIVE mg/dL
Hgb urine dipstick: NEGATIVE
Ketones, ur: NEGATIVE mg/dL
Nitrite: NEGATIVE
Protein, ur: NEGATIVE mg/dL
Specific Gravity, Urine: 1.015 (ref 1.005–1.030)
pH: 7.5 (ref 5.0–8.0)

## 2022-12-14 LAB — CBC WITH DIFFERENTIAL/PLATELET
Abs Immature Granulocytes: 0.01 10*3/uL (ref 0.00–0.07)
Basophils Absolute: 0 10*3/uL (ref 0.0–0.1)
Basophils Relative: 0 %
Eosinophils Absolute: 0.1 10*3/uL (ref 0.0–0.5)
Eosinophils Relative: 1 %
HCT: 36.3 % (ref 36.0–46.0)
Hemoglobin: 11.1 g/dL — ABNORMAL LOW (ref 12.0–15.0)
Immature Granulocytes: 0 %
Lymphocytes Relative: 40 %
Lymphs Abs: 1.6 10*3/uL (ref 0.7–4.0)
MCH: 28.6 pg (ref 26.0–34.0)
MCHC: 30.6 g/dL (ref 30.0–36.0)
MCV: 93.6 fL (ref 80.0–100.0)
Monocytes Absolute: 0.6 10*3/uL (ref 0.1–1.0)
Monocytes Relative: 13 %
Neutro Abs: 1.9 10*3/uL (ref 1.7–7.7)
Neutrophils Relative %: 46 %
Platelets: 212 10*3/uL (ref 150–400)
RBC: 3.88 MIL/uL (ref 3.87–5.11)
RDW: 12.4 % (ref 11.5–15.5)
WBC: 4.1 10*3/uL (ref 4.0–10.5)
nRBC: 0 % (ref 0.0–0.2)

## 2022-12-14 LAB — CBG MONITORING, ED: Glucose-Capillary: 109 mg/dL — ABNORMAL HIGH (ref 70–99)

## 2022-12-14 LAB — AMMONIA: Ammonia: 31 umol/L (ref 9–35)

## 2022-12-14 LAB — TSH: TSH: 13.19 u[IU]/mL — ABNORMAL HIGH (ref 0.350–4.500)

## 2022-12-14 LAB — I-STAT CG4 LACTIC ACID, ED: Lactic Acid, Venous: 1.6 mmol/L (ref 0.5–1.9)

## 2022-12-14 NOTE — Discharge Instructions (Signed)
So no evidence of stroke today, MRI scan of the brain is normal and labs look good except thyroid continues to be an issue.  She needs to follow-up with her regular doctor because they may need to adjust the dose of her thyroid medication.  Call them tomorrow so they can follow-up on that.  Otherwise no signs of infection or anything else going on today.

## 2022-12-14 NOTE — ED Provider Notes (Signed)
Assumed care from Dr. Julieanne Manson at 4 PM.  Patient waiting on MRI to ensure no evidence of stroke due to abnormal CT.  Patient's MRI is negative for evidence of acute stroke.  Otherwise labs are reassuring.  Patient wakes up easily and is in no acute distress at this time.  Spoke with patient's daughter Harriett Sine discussed with her the results of her tests and feel that patient is stable for discharge.  They will come and pick the patient up.  No indication for admission today.   Gwyneth Sprout, MD 12/14/22 2104

## 2022-12-14 NOTE — ED Notes (Signed)
Got patient into a gown on the monitor did EKG shown to Dr Tegeler patient is resting with call bell in reach

## 2022-12-14 NOTE — ED Triage Notes (Signed)
Pt BIB [facility] from home due to decreased LOC.  Pt receives care [facility].  Family states she was normal yesterday but today woke up with decreased LOC.  Hx diabetes.  VS BP 127/80, HR 80, CBG 127, SpO2 97%

## 2022-12-14 NOTE — ED Provider Notes (Signed)
[place] EMERGENCY DEPARTMENT AT [facility] Provider Note   CSN: [id number] Arrival date & time: 12/14/22  4098     History  No chief complaint on file.   Sharon Conway is a 89 y.o. female.  The history is provided by the patient and medical records. No language interpreter was used.  Altered Mental Status Presenting symptoms: confusion   Presenting symptoms comment:  AMS and somnolence Severity:  Moderate Most recent episode:  Today Episode history:  Continuous Timing:  Constant Progression:  Unchanged Chronicity:  New Associated symptoms: no abdominal pain, no agitation, no fever, no headaches, no light-headedness, no nausea, no palpitations, no rash, no seizures, no visual change, no vomiting and no weakness        Home Medications Prior to Admission medications   Medication Sig Start Date End Date Taking? Authorizing Provider  Acetaminophen (ACETAMINOPHEN EXTRA STRENGTH) 167 MG/5ML LIQD Take 15 mLs by mouth every 6 (six) hours as needed (osteoarthritis/headache).    [provider]  albuterol (VENTOLIN HFA) 108 (90 Base) MCG/ACT inhaler Inhale 2 puffs into the lungs every 6 (six) hours as needed for wheezing or shortness of breath.    [provider]  diclofenac Sodium (VOLTAREN) 1 % GEL Apply 1 application topically 2 (two) times daily as needed (arthritis pain).    [provider]  famotidine (PEPCID) 10 MG tablet Take 10 mg by mouth daily.    [provider]  feeding supplement, ENSURE ENLIVE, (ENSURE ENLIVE) LIQD Take 237 mLs by mouth 2 (two) times daily between meals. Patient taking differently: Take 237 mLs by mouth daily. 10/03/15   Ahmed, Elyn Peers, MD  guaiFENesin-dextromethorphan (ROBITUSSIN DM) 100-10 MG/5ML syrup Take 10 mLs by mouth every 4 (four) hours as needed for cough. 05/20/21   Calvert Cantor, MD  Ipratropium-Albuterol (COMBIVENT) 20-100 MCG/ACT AERS respimat Inhale 1 puff into the lungs every 6 (six) hours as  needed for wheezing. 05/20/21   Calvert Cantor, MD  losartan (COZAAR) 25 MG tablet Take 25 mg by mouth daily.    [provider]  metFORMIN (GLUCOPHAGE) 500 MG tablet Take 500 mg by mouth daily.    [provider]  methimazole (TAPAZOLE) 5 MG tablet Take 5 mg by mouth daily.    [provider]  metoprolol succinate (TOPROL-XL) 25 MG 24 hr tablet Take 12.5 mg by mouth 2 (two) times daily.    [provider]  Multiple Vitamins-Minerals (PRESERVISION AREDS 2) CAPS Take 1 capsule by mouth daily.    [provider]  pantoprazole (PROTONIX) 20 MG tablet Take 20 mg by mouth daily.    [provider]  polyethylene glycol powder (GLYCOLAX/MIRALAX) 17 GM/SCOOP powder Take 17 g by mouth daily as needed for mild constipation.    [provider]      Allergies    Patient has no known allergies.    Review of Systems   Review of Systems  Constitutional:  Positive for fatigue. Negative for chills, diaphoresis and fever.  HENT:  Negative for congestion.   Respiratory:  Negative for cough, chest tightness, shortness of breath and wheezing.   Cardiovascular:  Negative for palpitations.  Gastrointestinal:  Negative for abdominal pain, constipation, diarrhea, nausea and vomiting.  Genitourinary:  Negative for dysuria and frequency.  Musculoskeletal:  Negative for back pain, neck pain and neck stiffness.  Skin:  Negative for rash and wound.  Neurological:  Negative for seizures, weakness, light-headedness, numbness and headaches.  Psychiatric/Behavioral:  Positive for confusion. Negative for  agitation.   All other systems reviewed and are negative.   Physical Exam Updated Vital Signs BP (!) 151/112 (BP Location: Right Arm)   Pulse 80   Temp 97.7 F (36.5 C) (Oral)   Resp (!) 23   Ht 5\' 3"  (1.6 m)   Wt 55 kg   SpO2 99%   BMI 21.48 kg/m  Physical Exam Vitals and nursing note reviewed.  Constitutional:      General: She is not in acute  distress.    Appearance: She is well-developed. She is not ill-appearing, toxic-appearing or diaphoretic.  HENT:     Head: Normocephalic and atraumatic.     Nose: No congestion or rhinorrhea.     Mouth/Throat:     Pharynx: No oropharyngeal exudate or posterior oropharyngeal erythema.  Eyes:     Extraocular Movements: Extraocular movements intact.     Conjunctiva/sclera: Conjunctivae normal.     Pupils: Pupils are equal, round, and reactive to light.  Cardiovascular:     Rate and Rhythm: Normal rate and regular rhythm.     Pulses: Normal pulses.     Heart sounds: No murmur heard. Pulmonary:     Effort: Pulmonary effort is normal. No respiratory distress.     Breath sounds: Normal breath sounds. No wheezing, rhonchi or rales.  Chest:     Chest wall: No tenderness.  Abdominal:     General: Abdomen is flat.     Palpations: Abdomen is soft.     Tenderness: There is no abdominal tenderness. There is no right CVA tenderness, left CVA tenderness, guarding or rebound.  Musculoskeletal:        General: No swelling or tenderness.     Cervical back: Neck supple. No tenderness.  Skin:    General: Skin is warm and dry.     Capillary Refill: Capillary refill takes less than 2 seconds.     Findings: No erythema.  Neurological:     General: No focal deficit present.     Mental Status: She is alert.     Sensory: No sensory deficit.     Motor: No weakness.  Psychiatric:        Mood and Affect: Mood normal.     ED Results / Procedures / Treatments   Labs (all labs ordered are listed, but only abnormal results are displayed) Labs Reviewed  TSH - Abnormal; Notable for the following components:      Result Value   TSH 13.190 (*)    All other components within normal limits  CBC WITH DIFFERENTIAL/PLATELET - Abnormal; Notable for the following components:   Hemoglobin 11.1 (*)    All other components within normal limits  COMPREHENSIVE METABOLIC PANEL - Abnormal; Notable for the following  components:   Glucose, Bld 123 (*)    Creatinine, Ser 1.05 (*)    GFR, Estimated 47 (*)    All other components within normal limits  URINALYSIS, W/ REFLEX TO CULTURE (INFECTION SUSPECTED) - Abnormal; Notable for the following components:   Leukocytes,Ua TRACE (*)    Bacteria, UA RARE (*)    All other components within normal limits  CBG MONITORING, ED - Abnormal; Notable for the following components:   Glucose-Capillary 109 (*)    All other components within normal limits  AMMONIA  I-STAT CG4 LACTIC ACID, ED  I-STAT CG4 LACTIC ACID, ED    EKG EKG Interpretation Date/Time:  Thursday December 14 2022 09:23:23 EDT Ventricular Rate:  83 PR Interval:  207 QRS Duration:  131  QT Interval:  432 QTC Calculation: 508 R Axis:   92  Text Interpretation: Sinus rhythm RBBB and LPFB Nonspecific repol abnormality, lateral leads when compared to prior, similar appearnce No STEMI Confirmed by Theda Belfast ([id number]) on 12/14/2022 9:46:06 AM  Radiology CT Head Wo Contrast  Result Date: 12/14/2022 CLINICAL DATA:  Mental status change, unknown cause EXAM: CT HEAD WITHOUT CONTRAST TECHNIQUE: Contiguous axial images were obtained from the base of the skull through the vertex without intravenous contrast. RADIATION DOSE REDUCTION: This exam was performed according to the departmental dose-optimization program which includes automated exposure control, adjustment of the mA and/or kV according to patient size and/or use of iterative reconstruction technique. COMPARISON:  CT head 12/07/21 FINDINGS: Limitations: Motion degraded exam. Brain: There is apparent region of loss of gray-white differentiation in the left frontoparietal region (series 3, image 22). No evidence of hemorrhage, hydrocephalus, extra-axial collection or mass lesion/mass effect. Vascular: No hyperdense vessel or unexpected calcification. Skull: Normal. Negative for fracture or focal lesion. Sinuses/Orbits: No middle ear or mastoid effusion.  Paranasal sinuses are clear. Bilateral lens replacement. Orbits are otherwise unremarkable. Other: None. IMPRESSION: Motion degraded exam. Apparent region of loss of gray-white differentiation in the left frontoparietal region, which may be artifactual or related to an acute infarct. Consider further evaluation with a brain MRI if there is high clinical concern. Electronically Signed   By: Lorenza Cambridge M.D.   On: 12/14/2022 12:53   DG Chest Portable 1 View  Result Date: 12/14/2022 CLINICAL DATA:  cough, somnolence, AMS EXAM: PORTABLE CHEST 1 VIEW COMPARISON:  12/07/2021. FINDINGS: Suboptimal exam. Bilateral lung apices are obscured by the patient's jaw. Bilateral lung fields are clear. Bilateral costophrenic angles are clear. Moderately enlarged cardio-mediastinal silhouette is grossly unchanged. Aortic arch calcifications noted. No acute osseous abnormalities. The soft tissues are within normal limits. IMPRESSION: No active disease. Aortic Atherosclerosis (ICD10-I70.0). Electronically Signed   By: Jules Schick M.D.   On: 12/14/2022 10:51    Procedures Procedures    Medications Ordered in ED Medications - No data to display  ED Course/ Medical Decision Making/ A&P                             Medical Decision Making Amount and/or Complexity of Data Reviewed Labs: ordered. Radiology: ordered.    Sharon Conway is a 89 y.o. female with a past medical history significant for diabetes, hypothyroidism, previous diverticulosis, and previous GI bleeding who presents for altered mental status and somnolence.  According to EMS report from family, patient was at her baseline yesterday but today is more somnolent and fatigue.  Patient is not acting like herself reportedly.  Patient does state that she is feeling well but is admitting to the fatigue.  She also was coughing during her initial evaluation.  She denies any fevers, chills, nausea, vomiting, constipation, diarrhea, or urinary changes.  Denies  any rashes and denies any pain whatsoever.  No reported falls.  On my exam, lungs had some coarseness but chest was nontender.  Abdomen nontender.  Good pulses in extremities.  Patient moving all extremities.  Symmetric smile.  Clear speech.  Patient is pleasantly disoriented to time but is resting comfortably.  Given patient's report of altered mental status that has been associated with pneumonia in the past, will get chest x-ray given his cough.  Will get screening labs.  Will get urinalysis and will get a head CT given the somnolence and fatigue  new today.  Anticipate discussion with family when they arrive and we will start her workup.  Anticipate reassessment after workup to determine disposition.             1:53 PM Patient's workup began to return.  CBC showed mild anemia and metabolic panel did not show critical abnormality.  Lactic acid not elevated and ammonia is normal.  TSH slightly more elevated than prior at 13.1 but do not suspect she is in myxedema at this time.  CT scan did return with possible acute infarct.  Will order MRI to further evaluate.  Anticipate reassessment after MRI to determine disposition.  Urinalysis showed trace leukocytes and rare bacteria.  Given lack of urinary symptoms, doubt UTI.  Other labs overall did not show critical abnormalities that would like explain her symptoms.  Spoke to neurology who agreed with MRI given his CT scan showing concern for possible stroke.  Patient still resting.  Anticipate reassessment after MRI to determine disposition.  Care transferred to oncoming team to wait for results of MRI.        Final Clinical Impression(s) / ED Diagnoses Final diagnoses:  Somnolence     Clinical Impression: 1. Somnolence     Disposition: Care transferred to oncoming team to wait for results of MRI.  This note was prepared with assistance of Conservation officer, historic buildings. Occasional wrong-word or sound-a-like substitutions may  have occurred due to the inherent limitations of voice recognition software.     Ignazio Kincaid, Canary Brim, MD 12/14/22 1524

## 2022-12-14 NOTE — ED Notes (Signed)
Patient transported to MRI 

## 2022-12-14 NOTE — ED Notes (Signed)
Pt repositioned and SpO2 96% RA

## 2023-03-09 ENCOUNTER — Emergency Department (HOSPITAL_COMMUNITY)
Admission: EM | Admit: 2023-03-09 | Discharge: 2023-03-09 | Disposition: A | Discharge status: Home / Self Care | Payer: Medicare (Managed Care) | Attending: Emergency Medicine | Admitting: Emergency Medicine

## 2023-03-09 ENCOUNTER — Emergency Department (HOSPITAL_COMMUNITY): Payer: Medicare (Managed Care)

## 2023-03-09 DIAGNOSIS — Z1152 Encounter for screening for COVID-19: Secondary | ICD-10-CM | POA: Insufficient documentation

## 2023-03-09 DIAGNOSIS — Z7984 Long term (current) use of oral hypoglycemic drugs: Secondary | ICD-10-CM | POA: Diagnosis not present

## 2023-03-09 DIAGNOSIS — Z79899 Other long term (current) drug therapy: Secondary | ICD-10-CM | POA: Insufficient documentation

## 2023-03-09 DIAGNOSIS — J069 Acute upper respiratory infection, unspecified: Secondary | ICD-10-CM

## 2023-03-09 DIAGNOSIS — R5383 Other fatigue: Secondary | ICD-10-CM | POA: Diagnosis present

## 2023-03-09 DIAGNOSIS — E119 Type 2 diabetes mellitus without complications: Secondary | ICD-10-CM | POA: Insufficient documentation

## 2023-03-09 DIAGNOSIS — I10 Essential (primary) hypertension: Secondary | ICD-10-CM | POA: Diagnosis not present

## 2023-03-09 LAB — RESP PANEL BY RT-PCR (RSV, FLU A&B, COVID)  RVPGX2
Influenza A by PCR: NEGATIVE
Influenza B by PCR: NEGATIVE
Resp Syncytial Virus by PCR: NEGATIVE
SARS Coronavirus 2 by RT PCR: NEGATIVE

## 2023-03-09 LAB — CBC
HCT: 39.8 % (ref 36.0–46.0)
Hemoglobin: 11.9 g/dL — ABNORMAL LOW (ref 12.0–15.0)
MCH: 28.3 pg (ref 26.0–34.0)
MCHC: 29.9 g/dL — ABNORMAL LOW (ref 30.0–36.0)
MCV: 94.8 fL (ref 80.0–100.0)
Platelets: 234 10*3/uL (ref 150–400)
RBC: 4.2 MIL/uL (ref 3.87–5.11)
RDW: 12.1 % (ref 11.5–15.5)
WBC: 6.3 10*3/uL (ref 4.0–10.5)
nRBC: 0 % (ref 0.0–0.2)

## 2023-03-09 LAB — BASIC METABOLIC PANEL
Anion gap: 13 (ref 5–15)
BUN: 14 mg/dL (ref 8–23)
CO2: 28 mmol/L (ref 22–32)
Calcium: 9.6 mg/dL (ref 8.9–10.3)
Chloride: 94 mmol/L — ABNORMAL LOW (ref 98–111)
Creatinine, Ser: 0.84 mg/dL (ref 0.44–1.00)
GFR, Estimated: >60 mL/min (ref >60)
Glucose, Bld: 166 mg/dL — ABNORMAL HIGH (ref 70–99)
Potassium: 5 mmol/L (ref 3.5–5.1)
Sodium: 135 mmol/L (ref 135–145)

## 2023-03-09 LAB — BRAIN NATRIURETIC PEPTIDE: B Natriuretic Peptide: 182.4 pg/mL — ABNORMAL HIGH (ref 0.0–100.0)

## 2023-03-09 MED ORDER — GUAIFENESIN ER 1200 MG PO TB12: 1.0000 | ORAL_TABLET | Freq: Two times a day (BID) | ORAL | 0 refills | Status: DC

## 2023-03-09 MED ORDER — BENZONATATE 100 MG PO CAPS: 100.0000 mg | ORAL_CAPSULE | Freq: Three times a day (TID) | ORAL | 0 refills | Status: DC

## 2023-03-09 MED ORDER — BENZONATATE 100 MG PO CAPS
100.0000 mg | ORAL_CAPSULE | Freq: Once | ORAL | Status: AC
  Administered 2023-03-09: 100 mg via ORAL
  Filled 2023-03-09: qty 1

## 2023-03-09 NOTE — ED Provider Notes (Signed)
EMERGENCY DEPARTMENT AT [facility] Provider Note   CSN: [id number] Arrival date & time: 03/09/23  0272     History  Chief Complaint  Patient presents with   Fatigue    Sharon Conway is a 89 y.o. female.  HPI   Patient has a history of hypertension GI bleed diabetes anemia diverticulosis.  Patient presents to the ED for evaluation of fatigue.  Per EMS report family noted the patient went to bed early last night.  This morning had a hard time waking her up so they called EMS.  Patient states she has had a cough all night long.  She has had a hard time sleeping.  She has had some congestion in her throat as well.  Unknown if she have any fevers.  No shortness of breath.  No vomiting or diarrhea.  No abdominal pain.  Home Medications Prior to Admission medications   Medication Sig Start Date End Date Taking? Authorizing Provider  benzonatate (TESSALON) 100 MG capsule Take 1 capsule (100 mg total) by mouth every 8 (eight) hours. 03/09/23  Yes Linwood Dibbles, MD  Guaifenesin 1200 MG TB12 Take 1 tablet (1,200 mg total) by mouth 2 (two) times daily at 10 AM and 5 PM. 03/09/23  Yes Linwood Dibbles, MD  Acetaminophen (ACETAMINOPHEN EXTRA STRENGTH) 167 MG/5ML LIQD Take 15 mLs by mouth every 6 (six) hours as needed (osteoarthritis/headache).    [provider]  albuterol (VENTOLIN HFA) 108 (90 Base) MCG/ACT inhaler Inhale 2 puffs into the lungs every 6 (six) hours as needed for wheezing or shortness of breath.    [provider]  clotrimazole (LOTRIMIN) 1 % cream Apply 1 Application topically daily as needed (athletes foot). Apply to affected areas between toes nightly as needed.    [provider]  diclofenac Sodium (VOLTAREN) 1 % GEL Apply 1 application topically 2 (two) times daily as needed (arthritis pain).    [provider]  Emollient (LUBRIDERM SERIOUSLY SENSITIVE) LOTN Apply 1 Application topically daily as needed (dry skin). And after  showers to all extremities.    [provider]  famotidine (PEPCID) 10 MG tablet Take 10 mg by mouth daily.    [provider]  feeding supplement, ENSURE ENLIVE, (ENSURE ENLIVE) LIQD Take 237 mLs by mouth 2 (two) times daily between meals. Patient taking differently: Take 237 mLs by mouth 2 (two) times daily between meals. 10/03/15   Ahmed, Elyn Peers, MD  ferrous sulfate 325 (65 FE) MG EC tablet Take 325 mg by mouth See admin instructions. Take 1 tablet 3 times a week (Monday,Wednesday & Friday)    [provider]  fluticasone (FLONASE) 50 MCG/ACT nasal spray Place 1 spray into both nostrils daily as needed for allergies or rhinitis.    [provider]  gabapentin (NEURONTIN) 100 MG capsule Take 100 mg by mouth at bedtime.    [provider]  guaiFENesin-dextromethorphan (ROBITUSSIN DM) 100-10 MG/5ML syrup Take 10 mLs by mouth every 4 (four) hours as needed for cough. 05/20/21   Calvert Cantor, MD  loratadine (CLARITIN) 10 MG tablet Take 10 mg by mouth daily as needed for allergies.    [provider]  losartan (COZAAR) 25 MG tablet Take 25 mg by mouth daily.    [provider]  metFORMIN (GLUCOPHAGE) 500 MG tablet Take 500 mg by mouth 2 (two) times daily with a meal.    [provider]  methimazole (TAPAZOLE) 5 MG tablet Take 5 mg by mouth daily.  [provider]  metoprolol succinate (TOPROL-XL) 25 MG 24 hr tablet Take 12.5 mg by mouth 2 (two) times daily.    [provider]  Multiple Vitamins-Minerals (PRESERVISION AREDS 2) CAPS Take 1 capsule by mouth daily.    [provider]  polyethylene glycol powder (GLYCOLAX/MIRALAX) 17 GM/SCOOP powder Take 17 g by mouth daily as needed for mild constipation.    [provider]      Allergies    Patient has no known allergies.    Review of Systems   Review of Systems  Physical Exam Updated Vital Signs BP (!) 140/94   Pulse 90   Temp 97.6 F  (36.4 C) (Oral)   Resp (!) 21   Ht 1.6 m (5\' 3" )   SpO2 94%   BMI 21.48 kg/m  Physical Exam Vitals and nursing note reviewed.  Constitutional:      Appearance: She is well-developed. She is not diaphoretic.     Comments: Elderly, frail  HENT:     Head: Normocephalic and atraumatic.     Right Ear: External ear normal.     Left Ear: External ear normal.     Nose: Rhinorrhea present.     Mouth/Throat:     Mouth: Mucous membranes are moist.     Pharynx: No oropharyngeal exudate or posterior oropharyngeal erythema.  Eyes:     General: No scleral icterus.       Right eye: No discharge.        Left eye: No discharge.     Conjunctiva/sclera: Conjunctivae normal.  Neck:     Trachea: No tracheal deviation.  Cardiovascular:     Rate and Rhythm: Normal rate and regular rhythm.  Pulmonary:     Effort: No respiratory distress.     Breath sounds: No stridor. Rales present. No wheezing.     Comments: Frequently coughing, rhonchi and rales noted to the bases Abdominal:     General: Bowel sounds are normal. There is no distension.     Palpations: Abdomen is soft.     Tenderness: There is no abdominal tenderness. There is no guarding or rebound.  Musculoskeletal:        General: No tenderness or deformity.     Cervical back: Neck supple.  Skin:    General: Skin is warm and dry.     Findings: No rash.  Neurological:     General: No focal deficit present.     Mental Status: She is alert.     Cranial Nerves: No cranial nerve deficit, dysarthria or facial asymmetry.     Sensory: No sensory deficit.     Motor: No abnormal muscle tone or seizure activity.     Coordination: Coordination normal.  Psychiatric:        Mood and Affect: Mood normal.     ED Results / Procedures / Treatments   Labs (all labs ordered are listed, but only abnormal results are displayed) Labs Reviewed  CBC - Abnormal; Notable for the following components:      Result Value   Hemoglobin 11.9 (*)    MCHC 29.9  (*)    All other components within normal limits  BRAIN NATRIURETIC PEPTIDE - Abnormal; Notable for the following components:   B Natriuretic Peptide 182.4 (*)    All other components within normal limits  BASIC METABOLIC PANEL - Abnormal; Notable for the following components:   Chloride 94 (*)    Glucose, Bld 166 (*)    All other components  within normal limits  RESP PANEL BY RT-PCR (RSV, FLU A&B, COVID)  [id number]    EKG None  Radiology DG Chest 2 View  Result Date: 03/09/2023 CLINICAL DATA:  cough.  Chest pain. EXAM: CHEST - 2 VIEW COMPARISON:  12/14/2022. FINDINGS: Redemonstration of right pericardiac homogeneous opacity without obscuring the right heart margin, which may represent atelectasis and/or consolidation in the right lung lower lobe. Bilateral lung fields are otherwise clear. Bilateral costophrenic angles are clear. Normal cardio-mediastinal silhouette. There is retrocardiac lucency on the lateral film, which may represent a hiatal hernia. No acute osseous abnormalities. The soft tissues are within normal limits. IMPRESSION: 1. Stable right pericardiac opacity, possibly atelectasis and/or consolidation. 2. Possible hiatal hernia. Electronically Signed   By: Jules Schick M.D.   On: 03/09/2023 12:09    Procedures Procedures    Medications Ordered in ED Medications  benzonatate (TESSALON) capsule 100 mg (100 mg Oral Given 03/09/23 1058)    ED Course/ Medical Decision Making/ A&P Clinical Course as of 03/09/23 1321  Fri Mar 09, 2023  1056 Oral rehydration therapy ordered.  Patient has tolerated p.o. fluids [JK]  1227 BNP slightly increased at 182.  CBC normal.  COVID flu RSV negative [JK]  1227 Chest x-ray shows stable right pericardiac opacity, possible hiatal hernia [JK]  1301 No significant electrolyte abnormalities [JK]    Clinical Course User Index [JK] Linwood Dibbles, MD                                 Medical Decision Making Problems Addressed: Upper  respiratory tract infection, unspecified type: acute illness or injury that poses a threat to life or bodily functions  Amount and/or Complexity of Data Reviewed Labs: ordered. Decision-making details documented in ED Course. Radiology: ordered and independent interpretation performed.  Risk OTC drugs. Prescription drug management.   Patient presented to the ER for evaluation of cough congestion fatigue.  Patient noted to have frequent coughing in the ED.  She has nasal congestion.  Patient is alert and awake.  No somnolence noted.  No acute neurodeficits.  lAbs reassuring.  No signs of anemia or severe dehydration.  Chest x-ray does not show pneumonia.  Negative for COVID flu RSV.  Suspect viral illness.  I discussed findings with patient and her daughter.  Will discharge home with medications for her cough and congestion  Evaluation and diagnostic testing in the emergency department does not suggest an emergent condition requiring admission or immediate intervention beyond what has been performed at this time.  The patient is safe for discharge and has been instructed to return immediately for worsening symptoms, change in symptoms or any other concerns.       Final Clinical Impression(s) / ED Diagnoses Final diagnoses:  Upper respiratory tract infection, unspecified type    Rx / DC Orders ED Discharge Orders          Ordered    benzonatate (TESSALON) 100 MG capsule  Every 8 hours        03/09/23 1319    Guaifenesin 1200 MG TB12  2 times daily        03/09/23 1319              Linwood Dibbles, MD 03/09/23 1321

## 2023-03-09 NOTE — ED Triage Notes (Signed)
Pt biba, family called and states patient went to sleep early last night and this morning had a hard time waking her up. Concerned that something has changes. EMS states she is usually AAOX4, denies any urinary symptoms, fever or anything else

## 2023-03-09 NOTE — Discharge Instructions (Signed)
Take the medications to help with your cough and congestion.  Follow-up with your doctor later this week to be rechecked.  Return to the ER for worsening symptoms

## 2023-03-12 NOTE — Plan of Care (Signed)
[facility] Tonsillectomy/Adenoidectomy, Postoperative PEDS care plan entered in error.

## 2023-08-08 ENCOUNTER — Other Ambulatory Visit: Payer: Self-pay

## 2023-08-08 ENCOUNTER — Encounter (HOSPITAL_COMMUNITY): Payer: Self-pay

## 2023-08-08 ENCOUNTER — Emergency Department (HOSPITAL_COMMUNITY): Payer: Medicare (Managed Care)

## 2023-08-08 ENCOUNTER — Emergency Department (HOSPITAL_COMMUNITY)
Admission: EM | Admit: 2023-08-08 | Discharge: 2023-08-08 | Disposition: A | Discharge status: Home / Self Care | Payer: Medicare (Managed Care) | Attending: Emergency Medicine | Admitting: Emergency Medicine

## 2023-08-08 DIAGNOSIS — Z79899 Other long term (current) drug therapy: Secondary | ICD-10-CM | POA: Insufficient documentation

## 2023-08-08 DIAGNOSIS — R5383 Other fatigue: Secondary | ICD-10-CM | POA: Diagnosis present

## 2023-08-08 DIAGNOSIS — I10 Essential (primary) hypertension: Secondary | ICD-10-CM | POA: Diagnosis not present

## 2023-08-08 DIAGNOSIS — Z7984 Long term (current) use of oral hypoglycemic drugs: Secondary | ICD-10-CM | POA: Insufficient documentation

## 2023-08-08 DIAGNOSIS — F0390 Unspecified dementia without behavioral disturbance: Secondary | ICD-10-CM | POA: Diagnosis not present

## 2023-08-08 DIAGNOSIS — R400 Somnolence: Secondary | ICD-10-CM | POA: Insufficient documentation

## 2023-08-08 LAB — URINALYSIS, W/ REFLEX TO CULTURE (INFECTION SUSPECTED)
Bilirubin Urine: NEGATIVE
Glucose, UA: NEGATIVE mg/dL
Hgb urine dipstick: NEGATIVE
Ketones, ur: NEGATIVE mg/dL
Nitrite: NEGATIVE
Protein, ur: NEGATIVE mg/dL
Specific Gravity, Urine: 1.017 (ref 1.005–1.030)
pH: 5 (ref 5.0–8.0)

## 2023-08-08 LAB — CBG MONITORING, ED: Glucose-Capillary: 137 mg/dL — ABNORMAL HIGH (ref 70–99)

## 2023-08-08 NOTE — ED Notes (Signed)
 IV nurse unable to get labs or IV access

## 2023-08-08 NOTE — ED Provider Notes (Signed)
  Physical Exam  BP (!) 134/59   Pulse 67   Temp (!) 97.5 F (36.4 C)   Resp (!) 21   SpO2 96%   Physical Exam  Procedures  Procedures  ED Course / MDM    Medical Decision Making Care assumed at 3 PM.  Patient is 89 years old and here for altered mental status.  Patient apparently was very somnolent earlier.  Patient has been awake and alert while in the ER and has been eating food.  Multiple attempts to obtain blood work has been unsuccessful.  Signout pending urinalysis and chest x-ray and reassessment  4:34 PM Patient's CBG is 137.  Urinalysis did not show any obvious UTI.  Chest x-ray is clear.  Patient is back to baseline and is eating and states that she feels fine.  I talked to the daughter who is comfortable picking her up and wants to hold off on further workup in the ED.   Problems Addressed: Somnolence: acute illness or injury  Amount and/or Complexity of Data Reviewed Labs: ordered. Decision-making details documented in ED Course. Radiology: ordered and independent interpretation performed. Decision-making details documented in ED Course.          Charlynne Pander, MD 08/08/23 [phone]

## 2023-08-08 NOTE — ED Notes (Signed)
 Left voicemail for pt daughter to come pick her up, awaiting a call back

## 2023-08-08 NOTE — ED Notes (Signed)
Unsuccessful IV attempt x 3 

## 2023-08-08 NOTE — Discharge Instructions (Signed)
 Your blood sugar is normal today and your urinalysis did not show any urinary tract infection.    Please stay hydrated  See your doctor for follow-up  Return to ER if you have lethargy, confusion, vomiting, fever or any other concerns

## 2023-08-08 NOTE — ED Provider Notes (Signed)
 [place] EMERGENCY DEPARTMENT AT [facility] Provider Note   CSN: [id number] Arrival date & time: 08/08/23  1140     History  Chief Complaint  Patient presents with   Fatigue    Sharon Conway is a 89 y.o. female.  Patient is 89 year old female with a history of diabetes, anemia, hypertension and prior GI bleed who is presenting today by EMS for extreme fatigue.  Patient's daughter who she lives with called 911 today because she reports her mom was just so sleepy.  She reports she was able to get up and have a shower and take her medicine but she was very sluggish and groggy.  She has not had any nausea vomiting and did not eat breakfast this morning.  She has had no recent medication changes.  Daughter denies recent cough or congestion.  She reports she was fine yesterday.  The history is provided by the EMS personnel, medical records and a caregiver.       Home Medications Prior to Admission medications   Medication Sig Start Date End Date Taking? Authorizing Provider  Acetaminophen (ACETAMINOPHEN EXTRA STRENGTH) 167 MG/5ML LIQD Take 15 mLs by mouth every 6 (six) hours as needed (osteoarthritis/headache).    [provider]  albuterol (VENTOLIN HFA) 108 (90 Base) MCG/ACT inhaler Inhale 2 puffs into the lungs every 6 (six) hours as needed for wheezing or shortness of breath.    [provider]  benzonatate (TESSALON) 100 MG capsule Take 1 capsule (100 mg total) by mouth every 8 (eight) hours. 03/09/23   Linwood Dibbles, MD  clotrimazole (LOTRIMIN) 1 % cream Apply 1 Application topically daily as needed (athletes foot). Apply to affected areas between toes nightly as needed.    [provider]  diclofenac Sodium (VOLTAREN) 1 % GEL Apply 1 application topically 2 (two) times daily as needed (arthritis pain).    [provider]  Emollient (LUBRIDERM SERIOUSLY SENSITIVE) LOTN Apply 1 Application topically daily as needed (dry skin). And after  showers to all extremities.    [provider]  famotidine (PEPCID) 10 MG tablet Take 10 mg by mouth daily.    [provider]  feeding supplement, ENSURE ENLIVE, (ENSURE ENLIVE) LIQD Take 237 mLs by mouth 2 (two) times daily between meals. Patient taking differently: Take 237 mLs by mouth 2 (two) times daily between meals. 10/03/15   Ahmed, Elyn Peers, MD  ferrous sulfate 325 (65 FE) MG EC tablet Take 325 mg by mouth See admin instructions. Take 1 tablet 3 times a week (Monday,Wednesday & Friday)    [provider]  fluticasone (FLONASE) 50 MCG/ACT nasal spray Place 1 spray into both nostrils daily as needed for allergies or rhinitis.    [provider]  gabapentin (NEURONTIN) 100 MG capsule Take 100 mg by mouth at bedtime.    [provider]  Guaifenesin 1200 MG TB12 Take 1 tablet (1,200 mg total) by mouth 2 (two) times daily at 10 AM and 5 PM. 03/09/23   Linwood Dibbles, MD  guaiFENesin-dextromethorphan (ROBITUSSIN DM) 100-10 MG/5ML syrup Take 10 mLs by mouth every 4 (four) hours as needed for cough. 05/20/21   Calvert Cantor, MD  loratadine (CLARITIN) 10 MG tablet Take 10 mg by mouth daily as needed for allergies.    [provider]  losartan (COZAAR) 25 MG tablet Take 25 mg by mouth daily.    [provider]  metFORMIN (GLUCOPHAGE) 500 MG tablet Take 500 mg by mouth 2 (two) times daily with  a meal.    [provider]  methimazole (TAPAZOLE) 5 MG tablet Take 5 mg by mouth daily.    [provider]  metoprolol succinate (TOPROL-XL) 25 MG 24 hr tablet Take 12.5 mg by mouth 2 (two) times daily.    [provider]  Multiple Vitamins-Minerals (PRESERVISION AREDS 2) CAPS Take 1 capsule by mouth daily.    [provider]  polyethylene glycol powder (GLYCOLAX/MIRALAX) 17 GM/SCOOP powder Take 17 g by mouth daily as needed for mild constipation.    [provider]      Allergies    Patient has no known  allergies.    Review of Systems   Review of Systems  Physical Exam Updated Vital Signs BP (!) 134/59   Pulse 67   Temp (!) 97.5 F (36.4 C)   Resp (!) 21   SpO2 96%  Physical Exam Vitals and nursing note reviewed.  Constitutional:      General: She is not in acute distress.    Appearance: She is well-developed.     Comments: Very sleepy and hard to arouse  HENT:     Head: Normocephalic and atraumatic.  Eyes:     Pupils: Pupils are equal, round, and reactive to light.     Comments: 3 mm and reactive bilaterally  Cardiovascular:     Rate and Rhythm: Normal rate and regular rhythm.     Heart sounds: Normal heart sounds. No murmur heard.    No friction rub.  Pulmonary:     Effort: Pulmonary effort is normal.     Breath sounds: Normal breath sounds. No wheezing or rales.  Abdominal:     General: Bowel sounds are normal. There is no distension.     Palpations: Abdomen is soft.     Tenderness: There is no abdominal tenderness. There is no guarding or rebound.  Musculoskeletal:        General: No tenderness. Normal range of motion.     Comments: No edema  Skin:    General: Skin is warm and dry.     Findings: No rash.  Neurological:     Comments: Patient is sleeping and will not wake up to verbal stimuli     ED Results / Procedures / Treatments   Labs (all labs ordered are listed, but only abnormal results are displayed) Labs Reviewed  URINALYSIS, W/ REFLEX TO CULTURE (INFECTION SUSPECTED) - Abnormal; Notable for the following components:      Result Value   Leukocytes,Ua TRACE (*)    Bacteria, UA RARE (*)    All other components within normal limits  CBG MONITORING, ED - Abnormal; Notable for the following components:   Glucose-Capillary 137 (*)    All other components within normal limits  CBC WITH DIFFERENTIAL/PLATELET  COMPREHENSIVE METABOLIC PANEL  BLOOD GAS, VENOUS    EKG EKG Interpretation Date/Time:  Wednesday August 08 2023 13:51:18 EDT Ventricular  Rate:  70 PR Interval:  209 QRS Duration:  131 QT Interval:  436 QTC Calculation: 471 R Axis:   88  Text Interpretation: Sinus rhythm Probable left atrial enlargement Right bundle branch block Baseline wander No significant change since last tracing Confirmed by Gwyneth Sprout ([id number]) on 08/08/2023 2:37:39 PM  Radiology DG Chest Port 1 View Result Date: 08/08/2023 CLINICAL DATA:  Liver OG.  History of dementia. EXAM: PORTABLE CHEST 1 VIEW COMPARISON:  Chest radiograph dated 03/09/2023. FINDINGS: No focal consolidation, pleural effusion, or pneumothorax. Moderate cardiomegaly. Atherosclerotic calcification of the aorta. No  acute osseous pathology. IMPRESSION: 1. No active disease. 2. Moderate cardiomegaly. Electronically Signed   By: Elgie Collard M.D.   On: 08/08/2023 15:30    Procedures Procedures    Medications Ordered in ED Medications - No data to display  ED Course/ Medical Decision Making/ A&P                                 Medical Decision Making Amount and/or Complexity of Data Reviewed Labs: ordered. Decision-making details documented in ED Course. Radiology: ordered and independent interpretation performed. Decision-making details documented in ED Course. ECG/medicine tests: ordered and independent interpretation performed. Decision-making details documented in ED Course.   Pt with multiple medical problems and comorbidities and presenting today with a complaint that caries a high risk for morbidity and mortality.  Here today due to extreme fatigue and sleeping.  Patient was able to get up and take a shower per her daughter and take her medications but then went right back to sleep.  She denied any focal deficits.  The patient is very somnolent here but otherwise is well-appearing.  Vital signs are reassuring.  Patient came with a MOST form that reports she would not want any IV fluids, feeding tube or antibiotics.  Also says she is a DNR/DNI and wants comfort  measures.  Discussed this with her daughter and discussed whether the patient would want any type of workup done.  Her daughter reports that she would want a workup done and if something was found she would want antibiotics.  Will defer to daughter as she is her caregiver.  3:43 PM Patient was stuck numerous times and unable to obtain blood.  However patient now is awake alert and talking.  She denies having any pain.  She does report that she is hungry.  Vital signs remain normal.  I independently interpreted patient's labs and UA today without evidence of infection, CBG is 137.  I have independently visualized and interpreted pt's images today. Chest x-ray without acute findings and EKG is in a sinus rhythm without acute changes.  At this time patient is in no acute distress.  Also based on her MOST form do not feel that she needs further aggressive intervention.         Final Clinical Impression(s) / ED Diagnoses Final diagnoses:  None    Rx / DC Orders ED Discharge Orders     None         Gwyneth Sprout, MD 08/08/23 1544

## 2023-08-08 NOTE — ED Triage Notes (Signed)
 BIBA from [facility] for somnolence, difficult to arouse this AM per family. Hx of dementia. 126/53 BP 70 HR 95% room air 216 cbg

## 2023-12-10 ENCOUNTER — Other Ambulatory Visit: Payer: Self-pay

## 2023-12-10 ENCOUNTER — Encounter (HOSPITAL_COMMUNITY): Payer: Self-pay | Admitting: Internal Medicine

## 2023-12-10 ENCOUNTER — Inpatient Hospital Stay (HOSPITAL_COMMUNITY)
Admission: EM | Admit: 2023-12-10 | Discharge: 2023-12-15 | DRG: 811 | Disposition: A | Discharge status: Home Health | Payer: Medicare (Managed Care) | Attending: Internal Medicine | Admitting: Internal Medicine

## 2023-12-10 DIAGNOSIS — I10 Essential (primary) hypertension: Secondary | ICD-10-CM | POA: Diagnosis present

## 2023-12-10 DIAGNOSIS — D509 Iron deficiency anemia, unspecified: Secondary | ICD-10-CM | POA: Diagnosis present

## 2023-12-10 DIAGNOSIS — F0390 Unspecified dementia without behavioral disturbance: Secondary | ICD-10-CM | POA: Diagnosis present

## 2023-12-10 DIAGNOSIS — K5731 Diverticulosis of large intestine without perforation or abscess with bleeding: Principal | ICD-10-CM | POA: Diagnosis present

## 2023-12-10 DIAGNOSIS — R1310 Dysphagia, unspecified: Secondary | ICD-10-CM | POA: Diagnosis not present

## 2023-12-10 DIAGNOSIS — E0590 Thyrotoxicosis, unspecified without thyrotoxic crisis or storm: Secondary | ICD-10-CM | POA: Diagnosis present

## 2023-12-10 DIAGNOSIS — D649 Anemia, unspecified: Secondary | ICD-10-CM | POA: Insufficient documentation

## 2023-12-10 DIAGNOSIS — Z79899 Other long term (current) drug therapy: Secondary | ICD-10-CM

## 2023-12-10 DIAGNOSIS — K922 Gastrointestinal hemorrhage, unspecified: Principal | ICD-10-CM | POA: Diagnosis present

## 2023-12-10 DIAGNOSIS — K7460 Unspecified cirrhosis of liver: Secondary | ICD-10-CM | POA: Diagnosis present

## 2023-12-10 DIAGNOSIS — K625 Hemorrhage of anus and rectum: Secondary | ICD-10-CM | POA: Diagnosis not present

## 2023-12-10 DIAGNOSIS — D62 Acute posthemorrhagic anemia: Principal | ICD-10-CM | POA: Diagnosis present

## 2023-12-10 DIAGNOSIS — Z7984 Long term (current) use of oral hypoglycemic drugs: Secondary | ICD-10-CM

## 2023-12-10 DIAGNOSIS — K449 Diaphragmatic hernia without obstruction or gangrene: Secondary | ICD-10-CM | POA: Diagnosis present

## 2023-12-10 DIAGNOSIS — R0902 Hypoxemia: Secondary | ICD-10-CM | POA: Diagnosis not present

## 2023-12-10 DIAGNOSIS — Z66 Do not resuscitate: Secondary | ICD-10-CM | POA: Diagnosis present

## 2023-12-10 DIAGNOSIS — E119 Type 2 diabetes mellitus without complications: Secondary | ICD-10-CM | POA: Diagnosis present

## 2023-12-10 DIAGNOSIS — Z90710 Acquired absence of both cervix and uterus: Secondary | ICD-10-CM

## 2023-12-10 DIAGNOSIS — E039 Hypothyroidism, unspecified: Secondary | ICD-10-CM | POA: Diagnosis present

## 2023-12-10 LAB — CBC WITH DIFFERENTIAL/PLATELET
Abs Immature Granulocytes: 0.02 K/uL (ref 0.00–0.07)
Basophils Absolute: 0 K/uL (ref 0.0–0.1)
Basophils Relative: 0 %
Eosinophils Absolute: 0 K/uL (ref 0.0–0.5)
Eosinophils Relative: 0 %
HCT: 33.7 % — ABNORMAL LOW (ref 36.0–46.0)
Hemoglobin: 9.9 g/dL — ABNORMAL LOW (ref 12.0–15.0)
Immature Granulocytes: 0 %
Lymphocytes Relative: 29 %
Lymphs Abs: 2 K/uL (ref 0.7–4.0)
MCH: 27.3 pg (ref 26.0–34.0)
MCHC: 29.4 g/dL — ABNORMAL LOW (ref 30.0–36.0)
MCV: 93.1 fL (ref 80.0–100.0)
Monocytes Absolute: 0.7 K/uL (ref 0.1–1.0)
Monocytes Relative: 11 %
Neutro Abs: 4.1 K/uL (ref 1.7–7.7)
Neutrophils Relative %: 60 %
Platelets: 200 K/uL (ref 150–400)
RBC: 3.62 MIL/uL — ABNORMAL LOW (ref 3.87–5.11)
RDW: 13 % (ref 11.5–15.5)
WBC: 6.9 K/uL (ref 4.0–10.5)
nRBC: 0 % (ref 0.0–0.2)

## 2023-12-10 LAB — COMPREHENSIVE METABOLIC PANEL WITH GFR
ALT: 7 U/L (ref 0–44)
AST: 17 U/L (ref 15–41)
Albumin: 3.4 g/dL — ABNORMAL LOW (ref 3.5–5.0)
Alkaline Phosphatase: 43 U/L (ref 38–126)
Anion gap: 9 (ref 5–15)
BUN: 24 mg/dL — ABNORMAL HIGH (ref 8–23)
CO2: 25 mmol/L (ref 22–32)
Calcium: 9 mg/dL (ref 8.9–10.3)
Chloride: 108 mmol/L (ref 98–111)
Creatinine, Ser: 0.75 mg/dL (ref 0.44–1.00)
GFR, Estimated: >60 mL/min (ref >60)
Glucose, Bld: 123 mg/dL — ABNORMAL HIGH (ref 70–99)
Potassium: 5 mmol/L (ref 3.5–5.1)
Sodium: 142 mmol/L (ref 135–145)
Total Bilirubin: 0.6 mg/dL (ref 0.0–1.2)
Total Protein: 6.3 g/dL — ABNORMAL LOW (ref 6.5–8.1)

## 2023-12-10 LAB — HEMOGLOBIN AND HEMATOCRIT, BLOOD
HCT: 30.7 % — ABNORMAL LOW (ref 36.0–46.0)
HCT: 31.6 % — ABNORMAL LOW (ref 36.0–46.0)
Hemoglobin: 9.1 g/dL — ABNORMAL LOW (ref 12.0–15.0)
Hemoglobin: 9 g/dL — ABNORMAL LOW (ref 12.0–15.0)

## 2023-12-10 LAB — PROTIME-INR
INR: 1 (ref 0.8–1.2)
Prothrombin Time: 13.4 s (ref 11.4–15.2)

## 2023-12-10 LAB — POC OCCULT BLOOD, ED: Fecal Occult Blood: POSITIVE — AB

## 2023-12-10 MED ORDER — ONDANSETRON HCL 4 MG PO TABS: 4.0000 mg | ORAL_TABLET | Freq: Four times a day (QID) | ORAL | Status: DC | PRN

## 2023-12-10 MED ORDER — ACETAMINOPHEN 650 MG RE SUPP: 650.0000 mg | Freq: Four times a day (QID) | RECTAL | Status: DC | PRN

## 2023-12-10 MED ORDER — ALBUTEROL SULFATE (2.5 MG/3ML) 0.083% IN NEBU: 2.5000 mg | INHALATION_SOLUTION | RESPIRATORY_TRACT | Status: DC | PRN

## 2023-12-10 MED ORDER — ACETAMINOPHEN 325 MG PO TABS: 650.0000 mg | ORAL_TABLET | Freq: Four times a day (QID) | ORAL | Status: DC | PRN

## 2023-12-10 MED ORDER — ONDANSETRON HCL 4 MG/2ML IJ SOLN: 4.0000 mg | Freq: Four times a day (QID) | INTRAMUSCULAR | Status: DC | PRN

## 2023-12-10 NOTE — ED Provider Notes (Addendum)
 [place] EMERGENCY DEPARTMENT AT [facility] Provider Note   CSN: [id number] Arrival date & time: 12/10/23  9082     Patient presents with: Rectal Bleeding   Sharon Conway is a 89 y.o. female.   Patient brought in by EMS from home.  Caregiver noted that there was large amount of blood on the sheets.  They stated also was in her bowel movements.  Patient without any particular complaints.  Best we can tell patient not on any blood thinners.  Blood pressure as per EMS systolic was in the 100s.  Heart rate was around 100.  Patient has been on metformin  in the past.  Also has been on Toprol  XL in the past.  Not sure whether she still is.  Patient is a DNR.  Past medical history sniffer hypertension GI bleed in 2017 and diabetes type 2 history of diverticulosis.  Had a colonoscopy last time in 2017 had abdominal hysterectomy.  Never used tobacco products.       Prior to Admission medications   Medication Sig Start Date End Date Taking? Authorizing Provider  Acetaminophen  (ACETAMINOPHEN  EXTRA STRENGTH) 167 MG/5ML LIQD Take 15 mLs by mouth every 6 (six) hours as needed (osteoarthritis/headache).    [provider]  albuterol  (VENTOLIN  HFA) 108 (90 Base) MCG/ACT inhaler Inhale 2 puffs into the lungs every 6 (six) hours as needed for wheezing or shortness of breath.    [provider]  benzonatate  (TESSALON ) 100 MG capsule Take 1 capsule (100 mg total) by mouth every 8 (eight) hours. 03/09/23   Randol Simmonds, MD  clotrimazole (LOTRIMIN) 1 % cream Apply 1 Application topically daily as needed (athletes foot). Apply to affected areas between toes nightly as needed.    [provider]  diclofenac Sodium (VOLTAREN) 1 % GEL Apply 1 application topically 2 (two) times daily as needed (arthritis pain).    [provider]  Emollient (LUBRIDERM SERIOUSLY SENSITIVE) LOTN Apply 1 Application topically daily as needed (dry skin). And after showers to all  extremities.    [provider]  famotidine  (PEPCID ) 10 MG tablet Take 10 mg by mouth daily.    [provider]  feeding supplement, ENSURE ENLIVE, (ENSURE ENLIVE) LIQD Take 237 mLs by mouth 2 (two) times daily between meals. Patient taking differently: Take 237 mLs by mouth 2 (two) times daily between meals. 10/03/15   Ahmed, Tasrif, MD  ferrous sulfate 325 (65 FE) MG EC tablet Take 325 mg by mouth See admin instructions. Take 1 tablet 3 times a week (Monday,Wednesday & Friday)    [provider]  fluticasone (FLONASE) 50 MCG/ACT nasal spray Place 1 spray into both nostrils daily as needed for allergies or rhinitis.    [provider]  gabapentin  (NEURONTIN ) 100 MG capsule Take 100 mg by mouth at bedtime.    [provider]  Guaifenesin  1200 MG TB12 Take 1 tablet (1,200 mg total) by mouth 2 (two) times daily at 10 AM and 5 PM. 03/09/23   Randol Simmonds, MD  guaiFENesin -dextromethorphan  (ROBITUSSIN DM) 100-10 MG/5ML syrup Take 10 mLs by mouth every 4 (four) hours as needed for cough. 05/20/21   Rizwan, Saima, MD  loratadine (CLARITIN) 10 MG tablet Take 10 mg by mouth daily as needed for allergies.    [provider]  losartan  (COZAAR ) 25 MG tablet Take 25 mg by mouth daily.    [provider]  metFORMIN  (GLUCOPHAGE ) 500 MG tablet Take 500 mg by mouth 2 (two) times daily  with a meal.    [provider]  methimazole  (TAPAZOLE ) 5 MG tablet Take 5 mg by mouth daily.    [provider]  metoprolol  succinate (TOPROL -XL) 25 MG 24 hr tablet Take 12.5 mg by mouth 2 (two) times daily.    [provider]  Multiple Vitamins-Minerals (PRESERVISION AREDS 2) CAPS Take 1 capsule by mouth daily.    [provider]  polyethylene glycol powder (GLYCOLAX /MIRALAX ) 17 GM/SCOOP powder Take 17 g by mouth daily as needed for mild constipation.    [provider]    Allergies: Patient has no known allergies.    Review of  Systems  Constitutional:  Negative for chills and fever.  HENT:  Negative for ear pain and sore throat.   Eyes:  Negative for pain and visual disturbance.  Respiratory:  Negative for cough and shortness of breath.   Cardiovascular:  Negative for chest pain and palpitations.  Gastrointestinal:  Positive for blood in stool. Negative for abdominal pain and vomiting.  Genitourinary:  Negative for dysuria and hematuria.  Musculoskeletal:  Negative for arthralgias and back pain.  Skin:  Negative for color change and rash.  Neurological:  Negative for seizures and syncope.  All other systems reviewed and are negative.   Updated Vital Signs BP (!) 105/57   Pulse 96   Temp 98 F (36.7 C)   Resp 20   Ht 1.6 m (5' 3)   Wt 55 kg   SpO2 (!) 27%   BMI 21.48 kg/m   Physical Exam Vitals and nursing note reviewed.  Constitutional:      General: She is not in acute distress.    Appearance: She is well-developed. She is not ill-appearing.  HENT:     Head: Normocephalic and atraumatic.  Eyes:     Conjunctiva/sclera: Conjunctivae normal.  Cardiovascular:     Rate and Rhythm: Normal rate and regular rhythm.     Heart sounds: No murmur heard. Pulmonary:     Effort: Pulmonary effort is normal. No respiratory distress.     Breath sounds: Normal breath sounds.  Abdominal:     General: There is no distension.     Palpations: Abdomen is soft.     Tenderness: There is no abdominal tenderness. There is no guarding.  Genitourinary:    Rectum: Normal. Guaiac result positive.     Comments: Dark blood in the rectum.  Around perianal area.  No mass no tenderness. Musculoskeletal:        General: No swelling.     Cervical back: Neck supple.  Skin:    General: Skin is warm and dry.     Capillary Refill: Capillary refill takes less than 2 seconds.  Neurological:     General: No focal deficit present.     Mental Status: She is alert. Mental status is at baseline.  Psychiatric:        Mood and  Affect: Mood normal.     (all labs ordered are listed, but only abnormal results are displayed) Labs Reviewed  CBC WITH DIFFERENTIAL/PLATELET  COMPREHENSIVE METABOLIC PANEL WITH GFR  PROTIME-INR  POC OCCULT BLOOD, ED  TYPE AND SCREEN    EKG: None  Radiology: No results found.   Procedures   Medications Ordered in the ED - No data to display                                  Medical  Decision Making Amount and/or Complexity of Data Reviewed Labs: ordered.  Risk Decision regarding hospitalization.   Patient seems to have dark blood per rectum.  Significant GI bleed probably will require admission.  Will get labs will type and cross.  Hemoccult positive as expected.  CBC white count 6.9 hemoglobin 9.9 reassuring at this point in time platelets 200.  INR normal at 1.  Complete metabolic panel pending.  Patient has been typed and screened.  Patient followed by [facility] gastroenterology had a colonoscopy by them in 2017.  Patient is a DNR.  Will require admission due to the GI bleed.  High probability could be secondary to diverticulosis.  It appears the patient's complete metabolic panel had to be resubmitted.  It is still pending.  Final diagnoses:  Acute GI bleeding    ED Discharge Orders     None          Geraldene Hamilton, MD 12/10/23 9059    Geraldene Hamilton, MD 12/10/23 9053    Geraldene Hamilton, MD 12/10/23 1044    Geraldene Hamilton, MD 12/10/23 [phone]

## 2023-12-10 NOTE — ED Triage Notes (Signed)
 Pt caregiver noticed bright red blood from pt rectum  this morning. There was blood on the sheets and in pt BM. pT A&OX2 at baseline per family. Pt from home and unk is on blood thinners

## 2023-12-10 NOTE — H&P (Signed)
 History and Physical  Sharon Conway FMW:[id number] DOB: [birth year] DOA: 12/10/2023  PCP: Sharon Annabella CROME, DO   Chief Complaint: Bright red blood per rectum  HPI: Sharon Conway is a 89 y.o. female with medical history significant for non-insulin -dependent type 2 diabetes, prior GI bleeding due to diverticulosis, hypertension who lives in a facility and was brought for evaluation to the ER due to large amount of bright red blood found in her sheets this morning.  EMS was called, systolic blood pressure was reportedly in the 100s, her heart rate was about 100.  Patient has been somewhat hypotensive in the emergency department, but hemodynamically stable and asymptomatic.  She denies any abdominal pain, nausea or any other concerns.  Her hemoglobin this morning is 9.9, down from 11.9 in October 2024.  Review of Systems: Please see HPI for pertinent positives and negatives. A complete 10 system review of systems are otherwise negative.  Past Medical History:  Diagnosis Date   Anemia    Diabetes mellitus without complication (HCC)    type 2   Diverticulosis    GI bleed 09/2015   Hypertension    Past Surgical History:  Procedure Laterality Date   ABDOMINAL HYSTERECTOMY     COLONOSCOPY N/A 10/01/2015   Procedure: COLONOSCOPY;  Surgeon: Lynwood Bohr, MD;  Location: [facility] ENDOSCOPY;  Service: Endoscopy;  Laterality: N/A;  contol of bleeding   Social History:  reports that she has never smoked. She has never used smokeless tobacco. She reports that she does not drink alcohol and does not use drugs.  No Known Allergies  No family history on file.   Prior to Admission medications   Medication Sig Start Date End Date Taking? Authorizing Provider  Acetaminophen  (ACETAMINOPHEN  EXTRA STRENGTH) 167 MG/5ML LIQD Take 15 mLs by mouth every 6 (six) hours as needed (osteoarthritis/headache).    [provider]  albuterol  (VENTOLIN  HFA) 108 (90 Base) MCG/ACT inhaler Inhale 2 puffs into the lungs every 6  (six) hours as needed for wheezing or shortness of breath.    [provider]  benzonatate  (TESSALON ) 100 MG capsule Take 1 capsule (100 mg total) by mouth every 8 (eight) hours. 03/09/23   Randol Simmonds, MD  clotrimazole (LOTRIMIN) 1 % cream Apply 1 Application topically daily as needed (athletes foot). Apply to affected areas between toes nightly as needed.    [provider]  diclofenac Sodium (VOLTAREN) 1 % GEL Apply 1 application topically 2 (two) times daily as needed (arthritis pain).    [provider]  Emollient (LUBRIDERM SERIOUSLY SENSITIVE) LOTN Apply 1 Application topically daily as needed (dry skin). And after showers to all extremities.    [provider]  famotidine  (PEPCID ) 10 MG tablet Take 10 mg by mouth daily.    [provider]  feeding supplement, ENSURE ENLIVE, (ENSURE ENLIVE) LIQD Take 237 mLs by mouth 2 (two) times daily between meals. Patient taking differently: Take 237 mLs by mouth 2 (two) times daily between meals. 10/03/15   Ahmed, Tasrif, MD  ferrous sulfate 325 (65 FE) MG EC tablet Take 325 mg by mouth See admin instructions. Take 1 tablet 3 times a week (Monday,Wednesday & Friday)    [provider]  fluticasone (FLONASE) 50 MCG/ACT nasal spray Place 1 spray into both nostrils daily as needed for allergies or rhinitis.    [provider]  gabapentin  (NEURONTIN ) 100 MG capsule Take 100 mg by mouth at bedtime.    [provider]  Guaifenesin  1200 MG TB12 Take  1 tablet (1,200 mg total) by mouth 2 (two) times daily at 10 AM and 5 PM. 03/09/23   Randol Simmonds, MD  guaiFENesin -dextromethorphan  (ROBITUSSIN DM) 100-10 MG/5ML syrup Take 10 mLs by mouth every 4 (four) hours as needed for cough. 05/20/21   Rizwan, Saima, MD  loratadine (CLARITIN) 10 MG tablet Take 10 mg by mouth daily as needed for allergies.    [provider]  losartan  (COZAAR ) 25 MG tablet Take 25 mg by mouth daily.    [provider]  metFORMIN  (GLUCOPHAGE ) 500 MG tablet Take 500 mg by mouth 2 (two) times daily with a meal.    [provider]  methimazole  (TAPAZOLE ) 5 MG tablet Take 5 mg by mouth daily.    [provider]  metoprolol  succinate (TOPROL -XL) 25 MG 24 hr tablet Take 12.5 mg by mouth 2 (two) times daily.    [provider]  Multiple Vitamins-Minerals (PRESERVISION AREDS 2) CAPS Take 1 capsule by mouth daily.    [provider]  polyethylene glycol powder (GLYCOLAX /MIRALAX ) 17 GM/SCOOP powder Take 17 g by mouth daily as needed for mild constipation.    [provider]    Physical Exam: BP (!) 105/57   Pulse 96   Temp 98 F (36.7 C)   Resp 20   Ht 5' 3 (1.6 m)   Wt 55 kg   SpO2 (!) 27%   BMI 21.48 kg/m  General: Thin elderly female appearing her stated age, she is awake alert oriented, pleasant and cooperative. Cardiovascular: RRR, no murmurs or rubs, no peripheral edema  Respiratory: clear to auscultation bilaterally, no wheezes, no crackles  Abdomen: soft, nontender, nondistended, normal bowel tones heard  Skin: dry, no rashes  Musculoskeletal: no joint effusions, normal range of motion  Psychiatric: appropriate affect, normal speech  Neurologic: extraocular muscles intact, clear speech, moving all extremities with intact sensorium         Labs on Admission:  Basic Metabolic Panel: No results for input(s): NA, K, CL, CO2, GLUCOSE, BUN, CREATININE, CALCIUM, MG, PHOS in the last 168 hours. Liver Function Tests: No results for input(s): AST, ALT, ALKPHOS, BILITOT, PROT, ALBUMIN in the last 168 hours. No results for input(s): LIPASE, AMYLASE in the last 168 hours. No results for input(s): AMMONIA in the last 168 hours. CBC: Recent Labs  Lab 12/10/23 1010  WBC 6.9  NEUTROABS 4.1  HGB 9.9*  HCT 33.7*  MCV 93.1  PLT 200   Cardiac Enzymes: No results for input(s): CKTOTAL, CKMB, CKMBINDEX,  TROPONINI in the last 168 hours. BNP (last 3 results) Recent Labs    03/09/23 1022  BNP 182.4*    ProBNP (last 3 results) No results for input(s): PROBNP in the last 8760 hours.  CBG: No results for input(s): GLUCAP in the last 168 hours.  Radiological Exams on Admission: No results found. Assessment/Plan Illiana Losurdo is a 89 y.o. female with medical history significant for non-insulin -dependent type 2 diabetes, prior GI bleeding due to diverticulosis, hypertension who is being admitted for observation due to bright red blood per rectum.  BRBPR-patient is hypotensive but hemodynamically stable, with no further active bleeding.  No significant abdominal pain, she does have a history of severe diverticulosis seen on her last colonoscopy in 2018, when she was admitted to [facility] with the same chief complaint.  Current bleeding is likely diverticular. -Observation admission -Clear liquid diet -Trend hemoglobin every 8 hours -Avoid blood thinners -Discussed at length with the patient's daughter Inocente over the phone  Hypertension-hold Toprol  and Cozaar   Type 2 diabetes-hold metformin   DVT prophylaxis: Lovenox     Code Status: Limited: Do not attempt resuscitation (DNR) -DNR-LIMITED -Do Not Intubate/DNI , patient is DNR form was reviewed, and this was also confirmed with the patient's daughter Inocente over the phone at the time of admission.  Consults called: None  Admission status: Observation  Time spent: 56 minutes  Daanya Lanphier CHRISTELLA Gail MD [facility] Pager [phone]  If 7PM-7AM, please contact night-coverage [email or web address] Password [email or web address]  12/10/2023, 11:51 AM

## 2023-12-10 NOTE — Plan of Care (Signed)

## 2023-12-11 ENCOUNTER — Observation Stay (HOSPITAL_COMMUNITY): Payer: Medicare (Managed Care)

## 2023-12-11 DIAGNOSIS — K7460 Unspecified cirrhosis of liver: Secondary | ICD-10-CM | POA: Diagnosis present

## 2023-12-11 DIAGNOSIS — D509 Iron deficiency anemia, unspecified: Secondary | ICD-10-CM | POA: Diagnosis present

## 2023-12-11 DIAGNOSIS — K5731 Diverticulosis of large intestine without perforation or abscess with bleeding: Secondary | ICD-10-CM | POA: Diagnosis present

## 2023-12-11 DIAGNOSIS — I10 Essential (primary) hypertension: Secondary | ICD-10-CM | POA: Diagnosis present

## 2023-12-11 DIAGNOSIS — Z7984 Long term (current) use of oral hypoglycemic drugs: Secondary | ICD-10-CM | POA: Diagnosis not present

## 2023-12-11 DIAGNOSIS — K449 Diaphragmatic hernia without obstruction or gangrene: Secondary | ICD-10-CM | POA: Diagnosis present

## 2023-12-11 DIAGNOSIS — D649 Anemia, unspecified: Secondary | ICD-10-CM | POA: Insufficient documentation

## 2023-12-11 DIAGNOSIS — Z79899 Other long term (current) drug therapy: Secondary | ICD-10-CM | POA: Diagnosis not present

## 2023-12-11 DIAGNOSIS — R1310 Dysphagia, unspecified: Secondary | ICD-10-CM | POA: Diagnosis not present

## 2023-12-11 DIAGNOSIS — Z90710 Acquired absence of both cervix and uterus: Secondary | ICD-10-CM | POA: Diagnosis not present

## 2023-12-11 DIAGNOSIS — D62 Acute posthemorrhagic anemia: Secondary | ICD-10-CM | POA: Diagnosis present

## 2023-12-11 DIAGNOSIS — K922 Gastrointestinal hemorrhage, unspecified: Secondary | ICD-10-CM | POA: Diagnosis present

## 2023-12-11 DIAGNOSIS — F0390 Unspecified dementia without behavioral disturbance: Secondary | ICD-10-CM | POA: Diagnosis present

## 2023-12-11 DIAGNOSIS — R0902 Hypoxemia: Secondary | ICD-10-CM | POA: Diagnosis not present

## 2023-12-11 DIAGNOSIS — E119 Type 2 diabetes mellitus without complications: Secondary | ICD-10-CM | POA: Diagnosis present

## 2023-12-11 DIAGNOSIS — K625 Hemorrhage of anus and rectum: Secondary | ICD-10-CM | POA: Diagnosis not present

## 2023-12-11 DIAGNOSIS — E039 Hypothyroidism, unspecified: Secondary | ICD-10-CM | POA: Diagnosis present

## 2023-12-11 DIAGNOSIS — Z66 Do not resuscitate: Secondary | ICD-10-CM | POA: Diagnosis present

## 2023-12-11 DIAGNOSIS — E0590 Thyrotoxicosis, unspecified without thyrotoxic crisis or storm: Secondary | ICD-10-CM | POA: Diagnosis present

## 2023-12-11 LAB — GLUCOSE, CAPILLARY
Glucose-Capillary: 141 mg/dL — ABNORMAL HIGH (ref 70–99)
Glucose-Capillary: 103 mg/dL — ABNORMAL HIGH (ref 70–99)
Glucose-Capillary: 147 mg/dL — ABNORMAL HIGH (ref 70–99)

## 2023-12-11 LAB — RETICULOCYTES
Immature Retic Fract: 12.1 % (ref 2.3–15.9)
RBC.: 3.29 MIL/uL — ABNORMAL LOW (ref 3.87–5.11)
Retic Count, Absolute: 31.9 K/uL (ref 19.0–186.0)
Retic Ct Pct: 1 % (ref 0.4–3.1)

## 2023-12-11 LAB — CBC
HCT: 26.7 % — ABNORMAL LOW (ref 36.0–46.0)
Hemoglobin: 8 g/dL — ABNORMAL LOW (ref 12.0–15.0)
MCH: 27.8 pg (ref 26.0–34.0)
MCHC: 30 g/dL (ref 30.0–36.0)
MCV: 92.7 fL (ref 80.0–100.0)
Platelets: 170 K/uL (ref 150–400)
RBC: 2.88 MIL/uL — ABNORMAL LOW (ref 3.87–5.11)
RDW: 13.2 % (ref 11.5–15.5)
WBC: 5.8 K/uL (ref 4.0–10.5)
nRBC: 0 % (ref 0.0–0.2)

## 2023-12-11 LAB — BASIC METABOLIC PANEL WITH GFR
Anion gap: 8 (ref 5–15)
BUN: 23 mg/dL (ref 8–23)
CO2: 28 mmol/L (ref 22–32)
Calcium: 8.6 mg/dL — ABNORMAL LOW (ref 8.9–10.3)
Chloride: 102 mmol/L (ref 98–111)
Creatinine, Ser: 0.86 mg/dL (ref 0.44–1.00)
GFR, Estimated: 59 mL/min — ABNORMAL LOW (ref >60)
Glucose, Bld: 110 mg/dL — ABNORMAL HIGH (ref 70–99)
Potassium: 4.3 mmol/L (ref 3.5–5.1)
Sodium: 138 mmol/L (ref 135–145)

## 2023-12-11 LAB — HEMOGLOBIN AND HEMATOCRIT, BLOOD
HCT: 32.1 % — ABNORMAL LOW (ref 36.0–46.0)
Hemoglobin: 9.1 g/dL — ABNORMAL LOW (ref 12.0–15.0)

## 2023-12-11 LAB — D-DIMER, QUANTITATIVE: D-Dimer, Quant: 0.69 ug{FEU}/mL — ABNORMAL HIGH (ref 0.00–0.50)

## 2023-12-11 LAB — IRON AND TIBC
Iron: 37 ug/dL (ref 28–170)
Saturation Ratios: 14 % (ref 10.4–31.8)
TIBC: 266 ug/dL (ref 250–450)
UIBC: 229 ug/dL

## 2023-12-11 LAB — VITAMIN B12: Vitamin B-12: 680 pg/mL (ref 180–914)

## 2023-12-11 LAB — HEMOGLOBIN A1C
Hgb A1c MFr Bld: 7.5 % — ABNORMAL HIGH (ref 4.8–5.6)
Mean Plasma Glucose: 168.55 mg/dL

## 2023-12-11 LAB — FERRITIN: Ferritin: 64 ng/mL (ref 11–307)

## 2023-12-11 LAB — FOLATE: Folate: 16.5 ng/mL (ref >5.9)

## 2023-12-11 MED ORDER — INSULIN ASPART 100 UNIT/ML IJ SOLN
0.0000 [IU] | Freq: Three times a day (TID) | INTRAMUSCULAR | Status: DC
  Administered 2023-12-13: 1 [IU] via SUBCUTANEOUS
  Administered 2023-12-15: 2 [IU] via SUBCUTANEOUS

## 2023-12-11 MED ORDER — FAMOTIDINE 20 MG PO TABS
10.0000 mg | ORAL_TABLET | Freq: Every day | ORAL | Status: DC
  Administered 2023-12-11 – 2023-12-15 (×5): 10 mg via ORAL
  Filled 2023-12-11 (×5): qty 1

## 2023-12-11 MED ORDER — POLYETHYLENE GLYCOL 3350 17 G PO PACK: 17.0000 g | PACK | Freq: Every day | ORAL | Status: DC | PRN

## 2023-12-11 MED ORDER — GABAPENTIN 100 MG PO CAPS
100.0000 mg | ORAL_CAPSULE | Freq: Every day | ORAL | Status: DC
  Administered 2023-12-11 – 2023-12-14 (×4): 100 mg via ORAL
  Filled 2023-12-11 (×4): qty 1

## 2023-12-11 MED ORDER — PROSIGHT PO TABS
1.0000 | ORAL_TABLET | Freq: Every day | ORAL | Status: DC
  Administered 2023-12-11 – 2023-12-15 (×4): 1 via ORAL
  Filled 2023-12-11 (×4): qty 1

## 2023-12-11 MED ORDER — METHIMAZOLE 5 MG PO TABS
5.0000 mg | ORAL_TABLET | Freq: Every day | ORAL | Status: DC
  Administered 2023-12-11 – 2023-12-15 (×5): 5 mg via ORAL
  Filled 2023-12-11 (×5): qty 1

## 2023-12-11 MED ORDER — GUAIFENESIN-DM 100-10 MG/5ML PO SYRP: 10.0000 mL | ORAL_SOLUTION | ORAL | Status: DC | PRN

## 2023-12-11 NOTE — TOC Initial Note (Signed)
 Transition of Care [facility]) - Initial/Assessment Note    Patient Details  Name: Sharon Conway MRN: [id number] Date of Birth: June 17, 1918  Transition of Care [facility]) CM/SW Contact:    Bascom Service, RN Phone Number: 12/11/2023, 2:09 PM  Clinical Narrative: Beatris to dtr Sharon Conway about d/c plans-return home & continue w/[facility] services-has CNA-custodial level care 2hrs in am/2hrs in pm;rw;own transport home. On 02-monitor.left vm w/[facility] CSW Sharon Conway [phone]-await call back.                   Barriers to Discharge: Continued Medical Work up   Patient Goals and CMS Choice Patient states their goals for this hospitalization and ongoing recovery are:: home CMS Medicare.gov Compare Post Acute Care list provided to:: Patient Represenative (must comment) (Sharon Conway(dtr))   [place] ownership interest in [facility]provided to:: Adult Children    Expected Discharge Plan and Services   Discharge Planning Services: CM Consult Post Acute Care Choice: Resumption of Svcs/PTA Provider Living arrangements for the past 2 months: Single Family Home                                      Prior Living Arrangements/Services Living arrangements for the past 2 months: Single Family Home Lives with:: Adult Children              Current home services: DME (rw;CNA-custodial level care)    Activities of Daily Living   ADL Screening (condition at time of admission) Independently performs ADLs?: No Does the patient have a NEW difficulty with bathing/dressing/toileting/self-feeding that is expected to last >3 days?: No Does the patient have a NEW difficulty with getting in/out of bed, walking, or climbing stairs that is expected to last >3 days?: No Does the patient have a NEW difficulty with communication that is expected to last >3 days?: No Is the patient deaf or have difficulty hearing?: Yes Does the patient have difficulty seeing, even when wearing  glasses/contacts?: No Does the patient have difficulty concentrating, remembering, or making decisions?: Yes  Permission Sought/Granted                  Emotional Assessment              Admission diagnosis:  Acute GI bleeding [K92.2] BRBPR (bright red blood per rectum) [K62.5] Patient Active Problem List   Diagnosis Date Noted   Normocytic anemia 12/11/2023   Cirrhosis of liver (HCC) 12/11/2023   BRBPR (bright red blood per rectum) 12/10/2023   Acute metabolic encephalopathy 12/02/2021   Fall at home, initial encounter 12/01/2021   CAP (community acquired pneumonia) 12/01/2021   Hyperthyroidism 12/01/2021   COVID-19 05/18/2021   GI bleed 03/29/2017   Lower GI bleed 12/21/2016   Diverticulosis of colon with hemorrhage    Hypertension 11/21/2016   Chronic anemia 10/28/2015   Malnutrition of moderate degree 10/01/2015   Gastrointestinal hemorrhage associated with intestinal diverticulitis    Diabetes mellitus type II, controlled (HCC) 09/22/2015   PCP:  Sharon Annabella CROME, DO Pharmacy:   [facility] [phone] - [place], [place] - [address] AT [facility] [address] [place] [address] Phone: [phone] Fax: [phone]     Social Drivers of Health (SDOH) Social History: SDOH Screenings   Food Insecurity: No Food Insecurity (12/10/2023)  Housing: Low Risk  (12/10/2023)  Transportation Needs:  No Transportation Needs (12/10/2023)  Utilities: Not At Risk (12/10/2023)  Social Connections: Patient Declined (12/10/2023)  Tobacco Use: Low Risk  (12/10/2023)   SDOH Interventions:     Readmission Risk Interventions     No data to display

## 2023-12-11 NOTE — Progress Notes (Signed)
 Notified by NT that patient's oxygen saturation level was 86% on room air. Assessed patient. No visible signs of acute respiratory distress noted. Placed 2L of oxygen via nasal cannula to keep oxygen saturation level greater than 90%. Attempted verbal notification to daughter regarding change in patient status. Attempt unsuccessful. Will continue to monitor patient.

## 2023-12-11 NOTE — Plan of Care (Signed)
  Problem: Clinical Measurements: Goal: Cardiovascular complication will be avoided Outcome: Progressing   Problem: Clinical Measurements: Goal: Diagnostic test results will improve Outcome: Progressing   Problem: Pain Managment: Goal: General experience of comfort will improve and/or be controlled Outcome: Progressing

## 2023-12-11 NOTE — Plan of Care (Signed)
  Problem: Clinical Measurements: Goal: Diagnostic test results will improve Outcome: Progressing   Problem: Nutrition: Goal: Adequate nutrition will be maintained Outcome: Progressing   Problem: Pain Managment: Goal: General experience of comfort will improve and/or be controlled Outcome: Progressing   Problem: Safety: Goal: Ability to remain free from injury will improve Outcome: Progressing

## 2023-12-11 NOTE — Progress Notes (Signed)
 SATURATION QUALIFICATIONS: (This note is used to comply with regulatory documentation for home oxygen)  Patient Saturations on Room Air at Rest = 89%  Patient Saturations on Room Air while Ambulating = 75%  Patient Saturations on 2 Liters of oxygen while Ambulating = 92%  Please briefly explain why patient needs home oxygen:  Supplemental oxygen is required to maintain oxygen saturation level greater than 90%.

## 2023-12-11 NOTE — Progress Notes (Addendum)
 [facility] Progress Note  Patient: Sharon Conway     FMW:[id number]  DOA: 12/10/2023   PCP: Cloria Annabella CROME, DO       Brief hospital course: This is 89 year old female with non-insulin -dependent diabetes mellitus, diverticulosis with history of GI bleed in the past and hypertension who was brought to the hospital for bright red blood per rectum. Hemoglobin noted to be 9.9 in the ED.  Subjective:  No complaint of bloody stool or abdominal pain. Per RN she is hypoxic today. She is not short of breath and has no cough.   Assessment and Plan: Principal Problem:   BRBPR (bright red blood per rectum) -Appears that bleeding has resolved at this point-will advance diet - History of diverticulosis and hemorrhoids - Follow-up for recurrent bleeding  Active Problems:   Normocytic anemia - Hemoglobin 9.1-previously 11.9 when checked in October 2024 - Anemia panel does not reveal iron  deficiency - Continue to follow  Hypoxia - unclear reason- pulse ox in 70s on room air-no complaint of cough but she is a poor historian-   - CXR is negative - will check D dimer  Hyperthyroidism - Continue Tapazole   History of hypertension - BP is not elevated and therefore will not resume losartan  or metoprolol   Diabetes mellitus - Metformin  on hold - A1c 7.5-continue low-dose insulin  sliding scale  Called daughter to update her. No answer.       Code Status: Limited: Do not attempt resuscitation (DNR) -DNR-LIMITED -Do Not Intubate/DNI  Total time on patient care: 35 minutes DVT prophylaxis: SCDs  Objective:   Vitals:   12/10/23 1719 12/10/23 2041 12/11/23 0016 12/11/23 0249  BP: (!) 97/40 113/61 104/73 90/70  Pulse: 90 94 96 99  Resp: 20 20 20 20   Temp: 97.7 F (36.5 C) 98.4 F (36.9 C) 98.5 F (36.9 C) 98.2 F (36.8 C)  TempSrc: Oral Oral Axillary Oral  SpO2: 96% 100% 100% 95%  Weight:      Height:       Filed Weights   12/10/23 0928 12/10/23 1321  Weight: 55 kg 49.5 kg    Exam: General exam: Appears comfortable-oriented to person and place but not to situation HEENT: oral mucosa moist Respiratory system: Clear to auscultation.  Cardiovascular system: S1 & S2 heard  Gastrointestinal system: Abdomen soft, non-tender, nondistended. Normal bowel sounds   Extremities: No cyanosis, clubbing or edema Psychiatry:  Mood & affect appropriate.      CBC: Recent Labs  Lab 12/10/23 1010 12/10/23 1210 12/10/23 1930 12/11/23 0500  WBC 6.9  --   --  5.8  NEUTROABS 4.1  --   --   --   HGB 9.9* 9.1* 9.0* 8.0*  HCT 33.7* 30.7* 31.6* 26.7*  MCV 93.1  --   --  92.7  PLT 200  --   --  170   Basic Metabolic Panel: Recent Labs  Lab 12/10/23 1123 12/11/23 0500  NA 142 138  K 5.0 4.3  CL 108 102  CO2 25 28  GLUCOSE 123* 110*  BUN 24* 23  CREATININE 0.75 0.86  CALCIUM 9.0 8.6*     Scheduled Meds:  Imaging and lab data personally reviewed   Author: [employer]  12/11/2023 7:47 AM  To contact [facility]>   Check the care team in [facility] and look for the attending/consulting [facility] provider listed  Log into [email or web address] and use [place]'s universal password   Go to> [facility]  and find provider  If you still have  difficulty reaching the provider, please page the [facility] (Director on Call) for the Hospitalists listed on amion

## 2023-12-12 ENCOUNTER — Inpatient Hospital Stay (HOSPITAL_COMMUNITY): Payer: Medicare (Managed Care)

## 2023-12-12 DIAGNOSIS — K625 Hemorrhage of anus and rectum: Secondary | ICD-10-CM | POA: Diagnosis not present

## 2023-12-12 LAB — CBC
HCT: 25.3 % — ABNORMAL LOW (ref 36.0–46.0)
Hemoglobin: 7.2 g/dL — ABNORMAL LOW (ref 12.0–15.0)
MCH: 27 pg (ref 26.0–34.0)
MCHC: 28.5 g/dL — ABNORMAL LOW (ref 30.0–36.0)
MCV: 94.8 fL (ref 80.0–100.0)
Platelets: 174 K/uL (ref 150–400)
RBC: 2.67 MIL/uL — ABNORMAL LOW (ref 3.87–5.11)
RDW: 13.3 % (ref 11.5–15.5)
WBC: 3.9 K/uL — ABNORMAL LOW (ref 4.0–10.5)
nRBC: 0 % (ref 0.0–0.2)

## 2023-12-12 LAB — HEMOGLOBIN AND HEMATOCRIT, BLOOD
HCT: 31 % — ABNORMAL LOW (ref 36.0–46.0)
Hemoglobin: 9.4 g/dL — ABNORMAL LOW (ref 12.0–15.0)

## 2023-12-12 LAB — PREPARE RBC (CROSSMATCH)

## 2023-12-12 LAB — GLUCOSE, CAPILLARY
Glucose-Capillary: 125 mg/dL — ABNORMAL HIGH (ref 70–99)
Glucose-Capillary: 138 mg/dL — ABNORMAL HIGH (ref 70–99)
Glucose-Capillary: 121 mg/dL — ABNORMAL HIGH (ref 70–99)
Glucose-Capillary: 130 mg/dL — ABNORMAL HIGH (ref 70–99)

## 2023-12-12 MED ORDER — IOHEXOL 350 MG/ML SOLN
80.0000 mL | Freq: Once | INTRAVENOUS | Status: AC | PRN
  Administered 2023-12-12: 80 mL via INTRAVENOUS

## 2023-12-12 MED ORDER — SODIUM CHLORIDE 0.9 % IV SOLN: INTRAVENOUS | Status: DC

## 2023-12-12 MED ORDER — IOHEXOL 350 MG/ML SOLN: 100.0000 mL | Freq: Once | INTRAVENOUS | Status: DC | PRN

## 2023-12-12 MED ORDER — SODIUM CHLORIDE 0.9% IV SOLUTION: Freq: Once | INTRAVENOUS | Status: AC

## 2023-12-12 NOTE — Progress Notes (Addendum)
 PROGRESS NOTE  Sharon Conway FMW:[id number] DOB: Jan 20, 1919 DOA: 12/10/2023 PCP: Cloria Annabella CROME, DO   LOS: 1 day   Brief narrative:  Sharon Conway is a 89 year old female with non-insulin -dependent diabetes mellitus, diverticulosis with history of GI bleed in the past and hypertension who presented to the hospital from facility secondary to large amount of bright red blood found in her sheets on the morning of presentation.  EMS was called in and her blood pressure was slightly low with tachycardia.  Patient denies any abdominal pain.  In the ED, hemoglobin was noted to be 9.9 from baseline 11.9 in 2024 and was admitted hospital for further evaluation and treatment..  Assessment/Plan: Principal Problem:   BRBPR (bright red blood per rectum) Active Problems:   GI bleed   Hyperthyroidism   Normocytic anemia   Cirrhosis of liver (HCC)  BRBPR (bright red blood per rectum) Presented with painless bleeding.  History of diverticulosis and hemorrhoids.  No bleeding reported in the last shift.  Hemoglobin has however gone down to 7.2 from 9.9 on presentation.  Will transfuse 1 unit of packed RBC today.  Monitor H&H.  Patient unlikely to be of candidate for endoscopy colonoscopic intervention.  Has not had further bleeding since yesterday.  If continues to bleed patient might need CT bleeding scan/GI consult     Acute blood loss anemia secondary to GI bleed Normocytic anemia Latest hemoglobin of 7.2 from 9.1. previous 11.9 in October 2024.[name]  Anemia panel without iron  deficiency. Will continue to monitor closely and transfuse as necessary.  Will transfuse 1 unit of packed RBC today.   Hypoxia Unclear etiology.  No respiratory failure.  Chest x-ray negative for infiltrate.  Currently on 2 L of oxygen by nasal cannula.  D-dimer 0.6.  Will transfuse 1 unit of packed RBC and might help.  Add incentive spirometry deep breathing.  Encouraged deep breathing.   Hyperthyroidism Continue Tapazole .   History  of hypertension Continue to hold losartan  metoprolol  for now.  Latest blood pressure 111/59   Diabetes mellitus type 2. Latest hemoglobin A1c of 7.5.  Continue sliding scale insulin .  Metformin  on hold.  Latest POC glucose of 138  Advanced age frailty.  Patient lives with her daughter at home.  Will get PT OT evaluation.  DVT prophylaxis: SCDs Start: 12/10/23 1151  Addendum. 4:27 PM.  Nursing staff reported that she did have a large bloody bowel movement.  Patient slightly tachycardic.  Blood pressure still okay.  Communicated with the patient's daughter who stated okay with blood transfusion and procedure if necessary.  Communicated with GI regarding this.  At this time we will order for CT scan of the abdomen bleeding scan stat.  Will keep the patient n.p.o. start on IV fluids.  Will prepare 2 units of packed RBC and transfuse 1 unit now.  Communicated with the nursing staff about it  Disposition: Patient lives with her daughter at home.  Will get PT OT evaluation Status is: Inpatient Remains inpatient appropriate because: Significant anemia requiring PRBC transfusion, PT OT evaluation.  Closer monitoring.    Code Status:     Code Status: Limited: Do not attempt resuscitation (DNR) -DNR-LIMITED -Do Not Intubate/DNI   Family Communication: None at bedside  Consultants: None  Procedures: PRBC transfusion 1 unit  Anti-infectives:  None  Anti-infectives (From admission, onward)    None      Subjective: Today, patient was seen and examined at bedside.  Patient denies any nausea vomiting abdominal pain shortness of breath  or chest pain.  Has not had further bowel movement since admission.  On oral diet and has been tolerating well.  Denies any chest pain or shortness of breath  Objective: Vitals:   12/12/23 1026 12/12/23 1054  BP: (!) 106/51 (!) 111/59  Pulse: (!) 113 (!) 104  Resp:  20  Temp: 97.6 F (36.4 C) (!) 97.3 F (36.3 C)  SpO2: (!) 89% (P) 97%     Intake/Output Summary (Last 24 hours) at 12/12/2023 1227 Last data filed at 12/11/2023 1852 Gross per 24 hour  Intake 240 ml  Output --  Net 240 ml   Filed Weights   12/10/23 0928 12/10/23 1321  Weight: 55 kg 49.5 kg   Body mass index is 19.33 kg/m.   Physical Exam:  GENERAL: Patient is alert awake and Communicative not in obvious distress.  Thinly built, elderly female appears chronically ill and deconditioned.  On nasal cannula oxygen. HENT: Mild pallor noted.  Pupils equally reactive to light. Oral mucosa is moist NECK: is supple, no gross swelling noted. CHEST: Breath sounds noted bilaterally,  CVS: S1 and S2 heard, no murmur. Regular rate and rhythm.  ABDOMEN: Soft, non-tender, bowel sounds are present. EXTREMITIES: No edema. CNS:  generalized weakness noted, moves extremities.  Thin extremities.  Hard of hearing.  Slow to communication SKIN: warm and dry without rashes.  Data Review: I have personally reviewed the following laboratory data and studies,  CBC: Recent Labs  Lab 12/10/23 1010 12/10/23 1210 12/10/23 1930 12/11/23 0500 12/11/23 1242 12/12/23 0522  WBC 6.9  --   --  5.8  --  3.9*  NEUTROABS 4.1  --   --   --   --   --   HGB 9.9* 9.1* 9.0* 8.0* 9.1* 7.2*  HCT 33.7* 30.7* 31.6* 26.7* 32.1* 25.3*  MCV 93.1  --   --  92.7  --  94.8  PLT 200  --   --  170  --  174   Basic Metabolic Panel: Recent Labs  Lab 12/10/23 1123 12/11/23 0500  NA 142 138  K 5.0 4.3  CL 108 102  CO2 25 28  GLUCOSE 123* 110*  BUN 24* 23  CREATININE 0.75 0.86  CALCIUM 9.0 8.6*   Liver Function Tests: Recent Labs  Lab 12/10/23 1123  AST 17  ALT 7  ALKPHOS 43  BILITOT 0.6  PROT 6.3*  ALBUMIN 3.4*   No results for input(s): LIPASE, AMYLASE in the last 168 hours. No results for input(s): AMMONIA in the last 168 hours. Cardiac Enzymes: No results for input(s): CKTOTAL, CKMB, CKMBINDEX, TROPONINI in the last 168 hours. BNP (last 3 results) Recent  Labs    03/09/23 1022  BNP 182.4*    ProBNP (last 3 results) No results for input(s): PROBNP in the last 8760 hours.  CBG: Recent Labs  Lab 12/11/23 1202 12/11/23 1730 12/11/23 2114 12/12/23 0743 12/12/23 1135  GLUCAP 141* 103* 147* 125* 138*   No results found for this or any previous visit (from the past 240 hours).   Studies: DG Chest 2 View Result Date: 12/11/2023 CLINICAL DATA:  Hypoxia and cough. EXAM: CHEST - 2 VIEW COMPARISON:  Chest radiograph dated 08/08/2023. FINDINGS: No focal consolidation, pleural effusion, pneumothorax. Stable cardiac silhouette. Atherosclerotic calcification of the aorta. No acute osseous pathology. IMPRESSION: No active cardiopulmonary disease. Electronically Signed   By: Vanetta Chou M.D.   On: 12/11/2023 17:51      Vernal Alstrom, MD  [facility] 12/12/2023  If 7PM-7AM, please contact night-coverage

## 2023-12-12 NOTE — Plan of Care (Signed)
  Problem: Clinical Measurements: Goal: Cardiovascular complication will be avoided Outcome: Progressing   Problem: Coping: Goal: Level of anxiety will decrease Outcome: Progressing   Problem: Pain Managment: Goal: General experience of comfort will improve and/or be controlled Outcome: Progressing

## 2023-12-12 NOTE — Plan of Care (Signed)
  Problem: Clinical Measurements: Goal: Will remain free from infection Outcome: Progressing Goal: Diagnostic test results will improve Outcome: Progressing Goal: Respiratory complications will improve Outcome: Progressing Goal: Cardiovascular complication will be avoided Outcome: Progressing   Problem: Nutrition: Goal: Adequate nutrition will be maintained Outcome: Progressing   Problem: Coping: Goal: Level of anxiety will decrease Outcome: Progressing   Problem: Pain Managment: Goal: General experience of comfort will improve and/or be controlled Outcome: Progressing   Problem: Safety: Goal: Ability to remain free from injury will improve Outcome: Progressing

## 2023-12-13 DIAGNOSIS — D62 Acute posthemorrhagic anemia: Principal | ICD-10-CM

## 2023-12-13 DIAGNOSIS — K5731 Diverticulosis of large intestine without perforation or abscess with bleeding: Secondary | ICD-10-CM

## 2023-12-13 DIAGNOSIS — K625 Hemorrhage of anus and rectum: Secondary | ICD-10-CM | POA: Diagnosis not present

## 2023-12-13 LAB — GLUCOSE, CAPILLARY
Glucose-Capillary: 123 mg/dL — ABNORMAL HIGH (ref 70–99)
Glucose-Capillary: 133 mg/dL — ABNORMAL HIGH (ref 70–99)
Glucose-Capillary: 156 mg/dL — ABNORMAL HIGH (ref 70–99)
Glucose-Capillary: 134 mg/dL — ABNORMAL HIGH (ref 70–99)

## 2023-12-13 LAB — CBC
HCT: 33.9 % — ABNORMAL LOW (ref 36.0–46.0)
Hemoglobin: 10.2 g/dL — ABNORMAL LOW (ref 12.0–15.0)
MCH: 27.3 pg (ref 26.0–34.0)
MCHC: 30.1 g/dL (ref 30.0–36.0)
MCV: 90.9 fL (ref 80.0–100.0)
Platelets: 141 K/uL — ABNORMAL LOW (ref 150–400)
RBC: 3.73 MIL/uL — ABNORMAL LOW (ref 3.87–5.11)
RDW: 14.9 % (ref 11.5–15.5)
WBC: 5.5 K/uL (ref 4.0–10.5)
nRBC: 0 % (ref 0.0–0.2)

## 2023-12-13 LAB — BASIC METABOLIC PANEL WITH GFR
Anion gap: 5 (ref 5–15)
BUN: 24 mg/dL — ABNORMAL HIGH (ref 8–23)
CO2: 26 mmol/L (ref 22–32)
Calcium: 8.3 mg/dL — ABNORMAL LOW (ref 8.9–10.3)
Chloride: 104 mmol/L (ref 98–111)
Creatinine, Ser: 0.77 mg/dL (ref 0.44–1.00)
GFR, Estimated: >60 mL/min (ref >60)
Glucose, Bld: 117 mg/dL — ABNORMAL HIGH (ref 70–99)
Potassium: 4.3 mmol/L (ref 3.5–5.1)
Sodium: 135 mmol/L (ref 135–145)

## 2023-12-13 LAB — HEMOGLOBIN AND HEMATOCRIT, BLOOD
HCT: 36.2 % (ref 36.0–46.0)
Hemoglobin: 11.2 g/dL — ABNORMAL LOW (ref 12.0–15.0)

## 2023-12-13 MED ORDER — PANTOPRAZOLE SODIUM 40 MG IV SOLR
40.0000 mg | INTRAVENOUS | Status: DC
  Administered 2023-12-13 – 2023-12-15 (×3): 40 mg via INTRAVENOUS
  Filled 2023-12-13 (×3): qty 10

## 2023-12-13 NOTE — Consult Note (Signed)
 Referring Provider: Dr. Vernal Alstrom Primary Care Physician:  Cloria Annabella CROME, DO Primary Gastroenterologist:  Dr. Albertus   Reason for Consultation:  GI bleed  HPI: Sharon Conway is a 89 y.o. female with a past medical history of hypertension, DM type II, hypothyroidism, cognitive impairment, IDA and lower GI bleed in 2017 and 2018.  Past hysterectomy.  She presented to the ED 12/10/2023 after her caregiver saw bright red blood on the bed sheets and in her bowel movements.  Labs in the ED showed a WBC count of 6.9.  Hemoglobin 9.9 down from 11.28 February 2023.  Hematocrit 33.7.  Platelets 200.  INR 1.0.  Sodium 142.  Potassium 5.0.  BUN 24 up from 14.  Creatinine 0.75.  Albumin 3.4.  Total bili 0.6.  Alk phos 43.  AST 17.  ALT 7.  FOBT positive.  Labs 12/11/2023: Hg 8.0.  Iron  37.  TIBC 266.  Ferritin 64.  Vitamin B12 level 680.  Folate 16.5.  D-dimer 0.69.  Labs 12/12/2023: Hemoglobin 7.2.  She was transfused 1 unit of PRBCs -> Hg 9.4.  She passed a large volume dark red hematochezia around 4 PM and was transfused a second unit of PRBCs.  Abdominal/pelvic CTA was negative for active GI bleeding though limited evaluation due to timing of contrast was noted.  A large hiatal hernia containing entire stomach without volvulus or obstruction, mild urinary bladder wall thickening and diverticulosis without evidence of acute diverticulitis.  Labs today showed a Hemoglobin level of 10.2.  Platelets 141.  BUN 24.  Creatinine 0.77.  Patient was asleep but easily arousable.  She is disoriented to time and place.  She denies having any nausea or vomiting.  She sometimes has difficulty swallowing and stated she occasionally chokes on food.  No heartburn.  No upper or lower abdominal pain.  No chest pain or shortness of breath.  She is unable to tell me what her bowel movements at home look like. No family at the bedside at this time. I called her daughter Inocente and she stated her mother's bowel movements have  been black, loose and smelly for the past week with bright red blood x 1 day prior to admission.  She takes oral iron  tablets once daily since her prior GI bleed in 2017 or 2018.  Inocente verified her mother's bowel movements are usually brown on oral iron  but for the past week have been black.  No NSAID use or blood thinners.  Spoke to the patient's RN Powell who verified the patient had a smear of blood overnight otherwise no further rectal bleeding or BM.  She has a history of a lower GI bleed 09/2015. RBC scan showed  09/22/2015 showed active bleeding at the level of the splenic flexure. IR was consulted, angiogram showed active contrast extravasation from a small arcade vessel of the left common artery at the splenic flexure. Embolization of this small vessel was unsuccessful.   Patient is DNR/DNI  GI PROCEDURES:   Colonoscopy 10/01/2015 by Dr. Celestia as an inpatient secondary to lower GI bleed:  - Severe diverticulosis in the entire examined colon. There was no evidence of diverticular bleeding.  - The distal rectum and anal verge are normal on retroflexion view.   Past Medical History:  Diagnosis Date   Anemia    Diabetes mellitus without complication (HCC)    type 2   Diverticulosis    GI bleed 09/2015   Hypertension     Past Surgical History:  Procedure  Laterality Date   ABDOMINAL HYSTERECTOMY     COLONOSCOPY N/A 10/01/2015   Procedure: COLONOSCOPY;  Surgeon: Lynwood Bohr, MD;  Location: [facility] ENDOSCOPY;  Service: Endoscopy;  Laterality: N/A;  contol of bleeding    Prior to Admission medications   Medication Sig Start Date End Date Taking? Authorizing Provider  albuterol  (VENTOLIN  HFA) 108 (90 Base) MCG/ACT inhaler Inhale 2 puffs into the lungs every 6 (six) hours as needed for wheezing or shortness of breath.   Yes [provider]  clotrimazole (LOTRIMIN) 1 % cream Apply 1 Application topically daily as needed (athletes foot). Apply to affected areas between toes nightly as  needed.   Yes [provider]  diclofenac Sodium (VOLTAREN) 1 % GEL Apply 1 application topically 2 (two) times daily as needed (arthritis pain).   Yes [provider]  Emollient (LUBRIDERM SERIOUSLY SENSITIVE) LOTN Apply 1 Application topically daily as needed (dry skin). And after showers to all extremities.   Yes [provider]  famotidine  (PEPCID ) 10 MG tablet Take 10 mg by mouth daily.   Yes [provider]  feeding supplement, ENSURE ENLIVE, (ENSURE ENLIVE) LIQD Take 237 mLs by mouth 2 (two) times daily between meals. Patient taking differently: Take 237 mLs by mouth 2 (two) times daily between meals. 10/03/15  Yes Ahmed, Tasrif, MD  ferrous sulfate 325 (65 FE) MG EC tablet Take 325 mg by mouth See admin instructions. Take 1 tablet 3 times a week (Monday,Wednesday & Friday)   Yes [provider]  fluticasone (FLONASE) 50 MCG/ACT nasal spray Place 1 spray into both nostrils daily as needed for allergies or rhinitis.   Yes [provider]  gabapentin  (NEURONTIN ) 100 MG capsule Take 100 mg by mouth at bedtime.   Yes [provider]  guaiFENesin -dextromethorphan  (ROBITUSSIN DM) 100-10 MG/5ML syrup Take 10 mLs by mouth every 4 (four) hours as needed for cough. 05/20/21  Yes Rizwan, Saima, MD  losartan  (COZAAR ) 25 MG tablet Take 25 mg by mouth daily.   Yes [provider]  metFORMIN  (GLUCOPHAGE ) 500 MG tablet Take 500 mg by mouth 2 (two) times daily with a meal.   Yes [provider]  methimazole  (TAPAZOLE ) 5 MG tablet Take 5 mg by mouth daily.   Yes [provider]  metoprolol  succinate (TOPROL -XL) 25 MG 24 hr tablet Take 12.5 mg by mouth 2 (two) times daily.   Yes [provider]  Multiple Vitamins-Minerals (PRESERVISION AREDS 2) CAPS Take 1 capsule by mouth daily.   Yes [provider]  polyethylene glycol powder (GLYCOLAX /MIRALAX ) 17 GM/SCOOP powder Take 17 g by mouth daily as needed for  mild constipation.    [provider]    Current Facility-Administered Medications  Medication Dose Route Frequency Provider Last Rate Last Admin   0.9 %  sodium chloride  infusion   Intravenous Continuous Pokhrel, Laxman, MD 75 mL/hr at 12/13/23 0540 New Bag at 12/13/23 0540   acetaminophen  (TYLENOL ) tablet 650 mg  650 mg Oral Q6H PRN Zella, Mir M, MD       Or   acetaminophen  (TYLENOL ) suppository 650 mg  650 mg Rectal Q6H PRN Zella, Mir M, MD       albuterol  (PROVENTIL ) (2.5 MG/3ML) 0.083% nebulizer solution 2.5 mg  2.5 mg Nebulization Q2H PRN Zella, Mir M, MD       famotidine  (PEPCID ) tablet 10 mg  10 mg Oral Daily Rizwan, Saima, MD   10 mg at 12/12/23 1047   gabapentin  (NEURONTIN ) capsule 100 mg  100 mg Oral QHS Rizwan, Saima, MD   100 mg at 12/12/23 2151   guaiFENesin -dextromethorphan  (ROBITUSSIN DM) 100-10 MG/5ML syrup 10 mL  10 mL Oral Q4H PRN Rizwan, Saima, MD       insulin  aspart (novoLOG ) injection 0-6 Units  0-6 Units Subcutaneous TID WC Rizwan, Saima, MD       methimazole  (TAPAZOLE ) tablet 5 mg  5 mg Oral Daily Rizwan, Saima, MD   5 mg at 12/12/23 1047   multivitamin (PROSIGHT) tablet 1 tablet  1 tablet Oral Daily Rizwan, Saima, MD   1 tablet at 12/12/23 1047   ondansetron  (ZOFRAN ) tablet 4 mg  4 mg Oral Q6H PRN Zella, Mir M, MD       Or   ondansetron  (ZOFRAN ) injection 4 mg  4 mg Intravenous Q6H PRN Zella, Mir M, MD       polyethylene glycol (MIRALAX  / GLYCOLAX ) packet 17 g  17 g Oral Daily PRN Rizwan, Saima, MD        Allergies as of 12/10/2023   (No Known Allergies)    History reviewed. No pertinent family history.  Social History   Socioeconomic History   Marital status: Single    Spouse name: Not on file   Number of children: Not on file   Years of education: Not on file   Highest education level: Not on file  Occupational History   Not on file  Tobacco Use   Smoking status: Never   Smokeless tobacco: Never  Vaping Use    Vaping status: Never Used  Substance and Sexual Activity   Alcohol use: No    Alcohol/week: 0.0 standard drinks of alcohol   Drug use: No   Sexual activity: Not on file  Other Topics Concern   Not on file  Social History Narrative   Not on file   Social Drivers of Health   Financial Resource Strain: Not on file  Food Insecurity: No Food Insecurity (12/10/2023)   Hunger Vital Sign    Worried About Running Out of Food in the Last Year: Never true    Ran Out of Food in the Last Year: Never true  Transportation Needs: No Transportation Needs (12/10/2023)   PRAPARE - Administrator, Civil Service (Medical): No    Lack of Transportation (Non-Medical): No  Physical Activity: Not on file  Stress: Not on file  Social Connections: Patient Declined (12/10/2023)   Social Connection and Isolation Panel    Frequency of Communication with Friends and Family: Patient declined    Frequency of Social Gatherings with Friends and Family: Patient declined    Attends Religious Services: Patient declined    Database administrator or Organizations: Patient declined    Attends Banker Meetings: Patient declined    Marital Status: Patient declined  Intimate Partner Violence: Not At Risk (12/10/2023)   Humiliation, Afraid, Rape, and Kick questionnaire    Fear of Current or Ex-Partner: No    Emotionally Abused: No    Physically Abused: No    Sexually Abused: No    Review of Systems: Gen: Denies fever, sweats or chills. No weight loss.  CV: Denies chest pain, palpitations or edema. Resp: Denies cough, shortness of breath of hemoptysis.  GI: See HPI. GU: Denies urinary burning, blood in urine, increased urinary frequency or incontinence. MS: Denies joint pain, muscles aches or weakness. Derm: Denies rash, itchiness, skin lesions or unhealing ulcers. Psych: + Memory issues.  Heme: Denies easy bruising, bleeding. Neuro:  Denies headaches, dizziness or paresthesias. Endo:  + DM  type II.  Physical Exam: Vital signs in last 24 hours: Temp:  [97.3 F (36.3 C)-98.7 F (37.1 C)] 98.5 F (36.9 C) (07/24 0245) Pulse Rate:  [80-113] 94 (07/24 0245) Resp:  [16-20] 16 (07/24 0245) BP: (106-141)/(51-91) 134/62 (07/24 0245) SpO2:  [89 %-100 %] 96 % (07/24 0245) Last BM Date : 12/12/23 General: Easily arousable 89 year old female in no acute distress. Head:  Normocephalic and atraumatic. Eyes:  No scleral icterus. Conjunctiva pink. Ears:  Normal auditory acuity. Nose:  No deformity, discharge or lesions. Mouth: Upper dentures intact.  No ulcers or lesions.  Neck:  Supple. No lymphadenopathy or thyromegaly.  Lungs: Few crackles in the bases. Heart: Rate and rhythm, systolic murmur. Abdomen: Soft, nondistended.  Nontender.  Positive bowel sounds all 4 quadrants.  No palpable mass. Rectal: Deferred. Musculoskeletal: Symmetrical without gross deformities.  Pulses:  Normal pulses noted. Extremities: No lower extremity edema. Neurologic:  Alert and oriented x 1. No focal deficits.  Skin:  Intact without significant lesions or rashes. Psych:  Alert and cooperative. Normal mood and affect.  Intake/Output from previous day: 07/23 0701 - 07/24 0700 In: 966.2 [P.O.:240; I.V.:42.7; Blood:683.6] Out: -  Intake/Output this shift: No intake/output data recorded.  Lab Results: Recent Labs    12/11/23 0500 12/11/23 1242 12/12/23 0522 12/12/23 1540 12/13/23 0504  WBC 5.8  --  3.9*  --  5.5  HGB 8.0*   < > 7.2* 9.4* 10.2*  HCT 26.7*   < > 25.3* 31.0* 33.9*  PLT 170  --  174  --  141*   < > = values in this interval not displayed.   BMET Recent Labs    12/10/23 1123 12/11/23 0500 12/13/23 0504  NA 142 138 135  K 5.0 4.3 4.3  CL 108 102 104  CO2 25 28 26   GLUCOSE 123* 110* 117*  BUN 24* 23 24*  CREATININE 0.75 0.86 0.77  CALCIUM 9.0 8.6* 8.3*   LFT Recent Labs    12/10/23 1123  PROT 6.3*  ALBUMIN 3.4*  AST 17  ALT 7  ALKPHOS 43  BILITOT 0.6    PT/INR Recent Labs    12/10/23 1010  LABPROT 13.4  INR 1.0   Hepatitis Panel No results for input(s): HEPBSAG, HCVAB, HEPAIGM, HEPBIGM in the last 72 hours.    Studies/Results: CT ANGIO GI BLEED Result Date: 12/12/2023 CLINICAL DATA:  Lower GI bleed (Ped 0-17y) EXAM: CTA ABDOMEN AND PELVIS WITHOUT AND WITH CONTRAST TECHNIQUE: Multidetector CT imaging of the abdomen and pelvis was performed using the standard protocol during bolus administration of intravenous contrast. Multiplanar reconstructed images and MIPs were obtained and reviewed to evaluate the vascular anatomy. RADIATION DOSE REDUCTION: This exam was performed according to the departmental dose-optimization program which includes automated exposure control, adjustment of the mA and/or kV according to patient size and/or use of iterative reconstruction technique. CONTRAST:  80mL OMNIPAQUE  IOHEXOL  350 MG/ML SOLN COMPARISON:  None Available. FINDINGS: VASCULAR No extravasation of intravenous contrast within the lumen of the bowel. Limited evaluation due to timing of contrast. Aorta: Severe atherosclerotic plaque. Normal caliber aorta without aneurysm, dissection, vasculitis or significant stenosis. Celiac: Mild atherosclerotic plaque. Patent without evidence of aneurysm, dissection, vasculitis or significant stenosis. SMA: Moderate to severe atherosclerotic plaque. Patent without evidence of aneurysm, dissection, vasculitis or significant stenosis. Renals: Severe atherosclerotic plaque. Both renal arteries are patent without evidence of aneurysm, dissection, vasculitis, fibromuscular dysplasia or significant stenosis. IMA: Patent  without evidence of aneurysm, dissection, vasculitis or significant stenosis. Inflow: Severe atherosclerotic plaque. Patent without evidence of aneurysm, dissection, vasculitis or significant stenosis. Proximal Outflow: Severe atherosclerotic plaque. Bilateral common femoral and visualized portions of the  superficial and profunda femoral arteries are patent without evidence of aneurysm, dissection, vasculitis or significant stenosis. Veins: The main portal, superior mesenteric, splenic veins are patent. Review of the MIP images confirms the above findings. NON-VASCULAR Lower chest: Large hiatal hernia containing majority of stomach. Hepatobiliary: No focal liver abnormality. No gallstones, gallbladder wall thickening, or pericholecystic fluid. No biliary dilatation. Pancreas: Diffusely atrophic. No focal lesion. Otherwise normal pancreatic contour. No surrounding inflammatory changes. No main pancreatic ductal dilatation. Spleen: Normal in size without focal abnormality. Adrenals/Urinary Tract: No adrenal nodule bilaterally. Bilateral kidneys enhance symmetrically. No hydronephrosis. No hydroureter. Slightly irregular mild urinary bladder wall thickening. The urinary bladder is distended with urine. Stomach/Bowel: Stomach is within normal limits. No evidence of bowel wall thickening or dilatation. Colonic diverticulosis. Under distended rectum. Suggestion of appendectomy. Lymphatic: No lymphadenopathy. Reproductive: Status post hysterectomy. No adnexal masses. Other: No intraperitoneal free fluid. No intraperitoneal free gas. No organized fluid collection. Musculoskeletal: Tiny fat containing umbilical hernia. No suspicious lytic or blastic osseous lesions. No acute displaced fracture. Grade 2 anterolisthesis of L4 on L5. IMPRESSION: VASCULAR 1. No CT evidence of acute gastrointestinal bleed. Limited evaluation due to timing of contrast. 2.  Aortic Atherosclerosis (ICD10-I70.0)-severe. NON-VASCULAR 1. Large hiatal hernia containing the entire stomach with no findings of volvulus/obstruction. 2. Slightly irregular mild urinary bladder wall thickening. Correlate with urinalysis for infection. 3. Colonic diverticulosis with no acute diverticulitis. Electronically Signed   By: Morgane  Naveau M.D.   On: 12/12/2023 20:28    DG Chest 2 View Result Date: 12/11/2023 CLINICAL DATA:  Hypoxia and cough. EXAM: CHEST - 2 VIEW COMPARISON:  Chest radiograph dated 08/08/2023. FINDINGS: No focal consolidation, pleural effusion, pneumothorax. Stable cardiac silhouette. Atherosclerotic calcification of the aorta. No acute osseous pathology. IMPRESSION: No active cardiopulmonary disease. Electronically Signed   By: Vanetta Chou M.D.   On: 12/11/2023 17:51    IMPRESSION/PLAN:  89 year old female admitted 12/10/2023 with bright red hematochezia, suspect diverticular bleed. CTA 7/23 negative for active GI bleeding. Hg 9.9 -> 8 -> 7.2 - > transfused one unit of PRBCs -> then passed large volume hematochezia -> transfused a 2nd unit of PRBCs 7/23 -> today Hg 10.2. BUN 24. She had a small smear of blood without any further significant hematochezia. Hemodynamically stable. -Clear liquid diet -Continue to monitor patient for active GI bleeding -Check H&H every 8 hours x 24-hour -Transfuse for hemoglobin level less than 8 or as needed if symptomatic -No plans for endoscopic evaluation -Await further recommendations per Dr. Stacia  Questionable melena. Daughter endorsed mother has passed black loose stools for the past week.  Chronically on oral iron  at home, stools typically brown in color at home. Possible upper GI bleeding with large hiatal hernia. - Pantoprazole  40 mg IV daily - Continue to monitor bowel movements, nursing staff to record - See plan above  Large hiatal hernia per CTA containing the entire stomach without volvulus or obstruction.  Chronic IDA on oral iron  at home   Elida CHRISTELLA Shawl  12/13/2023, 9:25 AM

## 2023-12-13 NOTE — Consult Note (Signed)
 Patient with one small bloody stool, notified GI doctor who was rounding at time. Patient tolerated clear liquids for lunch, no c/o nausea/vomiting or abdominal pain.

## 2023-12-13 NOTE — Progress Notes (Signed)
 PROGRESS NOTE  Jozelynn Danielson FMW:[id number] DOB: 1919-03-20 DOA: 12/10/2023 PCP: Cloria Annabella CROME, DO   LOS: 2 days   Brief narrative:  Sharon Conway is a 89 year old female with non-insulin -dependent diabetes mellitus, diverticulosis with history of GI bleed in the past and hypertension who presented to the hospital from facility secondary to large amount of bright red blood found in her sheets on the morning of presentation.  EMS was called in and her blood pressure was slightly low with tachycardia.  Patient denies any abdominal pain.  In the ED, hemoglobin was noted to be 9.9 from baseline 11.9 in 2024 and was admitted hospital for further evaluation and treatment..  Assessment/Plan: Principal Problem:   BRBPR (bright red blood per rectum) Active Problems:   GI bleed   Hyperthyroidism   Normocytic anemia   Cirrhosis of liver (HCC)  BRBPR (bright red blood per rectum) Presented with painless bleeding.  History of diverticulosis and hemorrhoids.  No bleeding reported in the last shift.  Hemoglobin of 10.2 at this time after total 3 units of transfusion during hospitalization.  Has had a large bleed yesterday and underwent CTA of the abdomen without any signs of active bleeding.  Had a conversation with the patient's family yesterday and GI was consulted.  No further bleeding reported this morning.  As per GI no plans for  colonoscopic intervention yet.[name]  Has been restarted on clears.  Currently on normal saline 75 mL/h.  Will hold off for now.  Will continue to monitor closely for further bleeding.      Acute blood loss anemia secondary to GI bleed Normocytic anemia   Anemia panel without iron  deficiency.  Hemoglobin of 10.2 at this time after total of 3 units of transfusion.  Will transfuse more if continued bleeding  Hypoxia Unclear etiology.  No respiratory failure.  Chest x-ray negative for infiltrate.  Currently on room air.  D-dimer 0.6.    Hyperthyroidism Continue Tapazole .    Essential hypertension Continue to hold losartan  metoprolol  for now.  Latest blood pressure 137/99.   Diabetes mellitus type 2. Latest hemoglobin A1c of 7.5.  Continue sliding scale insulin .  Metformin  on hold.  Latest POC glucose of 133.  Advanced age, frailty.  Patient lives with her daughter at home.  Will get PT evaluation  DVT prophylaxis: SCDs Start: 12/10/23 1151  Disposition: Patient lives with her daughter at home.  Will get PT evaluation  Status is: Inpatient Remains inpatient appropriate because: Significant anemia requiring PRBC transfusion, PT OT evaluation.  Closer monitoring for GI bleed.    Code Status:     Code Status: Limited: Do not attempt resuscitation (DNR) -DNR-LIMITED -Do Not Intubate/DNI   Family Communication: Spoke with the patient's daughter on the phone 12/12/2023  Consultants: GI  Procedures: PRBC transfusion 3 units  Anti-infectives:  None  Anti-infectives (From admission, onward)    None      Subjective: Today, patient was seen and examined at bedside.  Patient had a large bowel movement yesterday and underwent CT scan of the abdomen without active extravasation.  Has not had further GI bleed this morning.  Denies any pain, nausea, vomiting.  Denies any shortness of breath fever or chills.  Objective: Vitals:   12/13/23 0928 12/13/23 1236  BP:  (!) 137/99  Pulse:  60  Resp:    Temp:  (!) 97.5 F (36.4 C)  SpO2: 96% 94%    Intake/Output Summary (Last 24 hours) at 12/13/2023 1457 Last data filed at 12/12/2023 1910  Gross per 24 hour  Intake 303.82 ml  Output --  Net 303.82 ml   Filed Weights   12/10/23 0928 12/10/23 1321  Weight: 55 kg 49.5 kg   Body mass index is 19.33 kg/m.   Physical Exam:  GENERAL: Patient is alert awake and Communicative.  Thinly built, elderly female appears chronically ill and deconditioned.  On nasal cannula oxygen.  Not in obvious distress. HENT: Pallor noted.  Pupils equally reactive to light.  Oral mucosa is moist NECK: is supple, no gross swelling noted. CHEST: Breath sounds noted bilaterally,  CVS: S1 and S2 heard, no murmur. Regular rate and rhythm.  ABDOMEN: Soft, non-tender, bowel sounds are present. EXTREMITIES: No edema. CNS:  generalized weakness noted, Thin extremities.  Hard of hearing.  SKIN: warm and dry without rashes.  Data Review: I have personally reviewed the following laboratory data and studies,  CBC: Recent Labs  Lab 12/10/23 1010 12/10/23 1210 12/11/23 0500 12/11/23 1242 12/12/23 0522 12/12/23 1540 12/13/23 0504  WBC 6.9  --  5.8  --  3.9*  --  5.5  NEUTROABS 4.1  --   --   --   --   --   --   HGB 9.9*   < > 8.0* 9.1* 7.2* 9.4* 10.2*  HCT 33.7*   < > 26.7* 32.1* 25.3* 31.0* 33.9*  MCV 93.1  --  92.7  --  94.8  --  90.9  PLT 200  --  170  --  174  --  141*   < > = values in this interval not displayed.   Basic Metabolic Panel: Recent Labs  Lab 12/10/23 1123 12/11/23 0500 12/13/23 0504  NA 142 138 135  K 5.0 4.3 4.3  CL 108 102 104  CO2 25 28 26   GLUCOSE 123* 110* 117*  BUN 24* 23 24*  CREATININE 0.75 0.86 0.77  CALCIUM 9.0 8.6* 8.3*   Liver Function Tests: Recent Labs  Lab 12/10/23 1123  AST 17  ALT 7  ALKPHOS 43  BILITOT 0.6  PROT 6.3*  ALBUMIN 3.4*   No results for input(s): LIPASE, AMYLASE in the last 168 hours. No results for input(s): AMMONIA in the last 168 hours. Cardiac Enzymes: No results for input(s): CKTOTAL, CKMB, CKMBINDEX, TROPONINI in the last 168 hours. BNP (last 3 results) Recent Labs    03/09/23 1022  BNP 182.4*    ProBNP (last 3 results) No results for input(s): PROBNP in the last 8760 hours.  CBG: Recent Labs  Lab 12/12/23 1135 12/12/23 1648 12/12/23 2010 12/13/23 0727 12/13/23 1120  GLUCAP 138* 121* 130* 123* 133*   No results found for this or any previous visit (from the past 240 hours).   Studies: CT ANGIO GI BLEED Result Date: 12/12/2023 CLINICAL DATA:  Lower GI  bleed (Ped 0-17y) EXAM: CTA ABDOMEN AND PELVIS WITHOUT AND WITH CONTRAST TECHNIQUE: Multidetector CT imaging of the abdomen and pelvis was performed using the standard protocol during bolus administration of intravenous contrast. Multiplanar reconstructed images and MIPs were obtained and reviewed to evaluate the vascular anatomy. RADIATION DOSE REDUCTION: This exam was performed according to the departmental dose-optimization program which includes automated exposure control, adjustment of the mA and/or kV according to patient size and/or use of iterative reconstruction technique. CONTRAST:  80mL OMNIPAQUE  IOHEXOL  350 MG/ML SOLN COMPARISON:  None Available. FINDINGS: VASCULAR No extravasation of intravenous contrast within the lumen of the bowel. Limited evaluation due to timing of contrast. Aorta: Severe atherosclerotic plaque. Normal caliber aorta  without aneurysm, dissection, vasculitis or significant stenosis. Celiac: Mild atherosclerotic plaque. Patent without evidence of aneurysm, dissection, vasculitis or significant stenosis. SMA: Moderate to severe atherosclerotic plaque. Patent without evidence of aneurysm, dissection, vasculitis or significant stenosis. Renals: Severe atherosclerotic plaque. Both renal arteries are patent without evidence of aneurysm, dissection, vasculitis, fibromuscular dysplasia or significant stenosis. IMA: Patent without evidence of aneurysm, dissection, vasculitis or significant stenosis. Inflow: Severe atherosclerotic plaque. Patent without evidence of aneurysm, dissection, vasculitis or significant stenosis. Proximal Outflow: Severe atherosclerotic plaque. Bilateral common femoral and visualized portions of the superficial and profunda femoral arteries are patent without evidence of aneurysm, dissection, vasculitis or significant stenosis. Veins: The main portal, superior mesenteric, splenic veins are patent. Review of the MIP images confirms the above findings. NON-VASCULAR  Lower chest: Large hiatal hernia containing majority of stomach. Hepatobiliary: No focal liver abnormality. No gallstones, gallbladder wall thickening, or pericholecystic fluid. No biliary dilatation. Pancreas: Diffusely atrophic. No focal lesion. Otherwise normal pancreatic contour. No surrounding inflammatory changes. No main pancreatic ductal dilatation. Spleen: Normal in size without focal abnormality. Adrenals/Urinary Tract: No adrenal nodule bilaterally. Bilateral kidneys enhance symmetrically. No hydronephrosis. No hydroureter. Slightly irregular mild urinary bladder wall thickening. The urinary bladder is distended with urine. Stomach/Bowel: Stomach is within normal limits. No evidence of bowel wall thickening or dilatation. Colonic diverticulosis. Under distended rectum. Suggestion of appendectomy. Lymphatic: No lymphadenopathy. Reproductive: Status post hysterectomy. No adnexal masses. Other: No intraperitoneal free fluid. No intraperitoneal free gas. No organized fluid collection. Musculoskeletal: Tiny fat containing umbilical hernia. No suspicious lytic or blastic osseous lesions. No acute displaced fracture. Grade 2 anterolisthesis of L4 on L5. IMPRESSION: VASCULAR 1. No CT evidence of acute gastrointestinal bleed. Limited evaluation due to timing of contrast. 2.  Aortic Atherosclerosis (ICD10-I70.0)-severe. NON-VASCULAR 1. Large hiatal hernia containing the entire stomach with no findings of volvulus/obstruction. 2. Slightly irregular mild urinary bladder wall thickening. Correlate with urinalysis for infection. 3. Colonic diverticulosis with no acute diverticulitis. Electronically Signed   By: Morgane  Naveau M.D.   On: 12/12/2023 20:28   DG Chest 2 View Result Date: 12/11/2023 CLINICAL DATA:  Hypoxia and cough. EXAM: CHEST - 2 VIEW COMPARISON:  Chest radiograph dated 08/08/2023. FINDINGS: No focal consolidation, pleural effusion, pneumothorax. Stable cardiac silhouette. Atherosclerotic  calcification of the aorta. No acute osseous pathology. IMPRESSION: No active cardiopulmonary disease. Electronically Signed   By: Vanetta Chou M.D.   On: 12/11/2023 17:51      Vernal Alstrom, MD  [facility] 12/13/2023  If 7PM-7AM, please contact night-coverage

## 2023-12-13 NOTE — Evaluation (Signed)
 Physical Therapy Evaluation Patient Details Name: Sharon Conway MRN: [id number] DOB: 07/23/1918 Today's Date: 12/13/2023  History of Present Illness  Pt is a 89 year old female who admitted for BRBPR (bright red blood per rectum) and Acute blood loss anemia secondary to GI bleed. PMHx: non-insulin -dependent diabetes mellitus, diverticulosis with history of GI bleed in the past and hypertension  Clinical Impression  Pt admitted with above diagnosis.  Pt currently with functional limitations due to the deficits listed below (see PT Problem List). Pt will benefit from acute skilled PT to increase their independence and safety with mobility to allow discharge.  Pt assisted with ambulating in hallway and performed good distance with RW.  Pt reports mostly just ambulating around home with use of RW so she did fatigue quickly but also denies any dizziness.  Pt reports no steps or stairs at home.  Pt reports living with her mother then changed to this to daughter.  Pt likely close to her baseline however recommend initial supervision for safety upon d/c.         If plan is discharge home, recommend the following: Assistance with cooking/housework;Assist for transportation;Help with stairs or ramp for entrance   Can travel by private vehicle        Equipment Recommendations None recommended by PT  Recommendations for Other Services       Functional Status Assessment Patient has had a recent decline in their functional status and demonstrates the ability to make significant improvements in function in a reasonable and predictable amount of time.     Precautions / Restrictions Precautions Precautions: Fall      Mobility  Bed Mobility               General bed mobility comments: pt in recliner    Transfers Overall transfer level: Needs assistance Equipment used: Rolling walker (2 wheels) Transfers: Sit to/from Stand Sit to Stand: Min assist           General transfer comment:  verbal cues for hand placement; assist to rise and stabilize    Ambulation/Gait Ambulation/Gait assistance: Contact guard assist, Min assist Gait Distance (Feet): 240 Feet Assistive device: Rolling walker (2 wheels) Gait Pattern/deviations: Step-through pattern, Decreased stride length, Trunk flexed       General Gait Details: verbal cues for RW positioning and posture, light assist for stability upon returning to room likely due to fatigue; pt denies dizziness  Stairs            Wheelchair Mobility     Tilt Bed    Modified Rankin (Stroke Patients Only)       Balance Overall balance assessment: Mild deficits observed, not formally tested                                           Pertinent Vitals/Pain Pain Assessment Pain Assessment: No/denies pain    Home Living Family/patient expects to be discharged to:: Private residence Living Arrangements: Children (daughter) Available Help at Discharge: Family;Available 24 hours/day   Home Access: Level entry       Home Layout: One level Home Equipment: Agricultural consultant (2 wheels);Cane - single point      Prior Function Prior Level of Function : Needs assist             Mobility Comments: reports use of RW for household ambulation       Extremity/Trunk Assessment  Lower Extremity Assessment Lower Extremity Assessment: Generalized weakness    Cervical / Trunk Assessment Cervical / Trunk Assessment: Normal  Communication   Communication Communication: Impaired Factors Affecting Communication: Hearing impaired    Cognition Arousal: Alert Behavior During Therapy: Flat affect                             Following commands: Intact       Cueing Cueing Techniques: Verbal cues     General Comments      Exercises     Assessment/Plan    PT Assessment Patient needs continued PT services  PT Problem List Decreased mobility;Decreased balance;Decreased activity  tolerance;Decreased strength;Decreased knowledge of use of DME       PT Treatment Interventions Gait training;DME instruction;Balance training;Functional mobility training;Therapeutic activities;Therapeutic exercise;Patient/family education;Stair training    PT Goals (Current goals can be found in the Care Plan section)  Acute Rehab PT Goals PT Goal Formulation: With patient Time For Goal Achievement: 12/27/23 Potential to Achieve Goals: Good    Frequency Min 3X/week     Co-evaluation               AM-PAC PT 6 Clicks Mobility  Outcome Measure Help needed turning from your back to your side while in a flat bed without using bedrails?: A Little Help needed moving from lying on your back to sitting on the side of a flat bed without using bedrails?: A Little Help needed moving to and from a bed to a chair (including a wheelchair)?: A Little Help needed standing up from a chair using your arms (e.g., wheelchair or bedside chair)?: A Little Help needed to walk in hospital room?: A Little Help needed climbing 3-5 steps with a railing? : A Little 6 Click Score: 18    End of Session Equipment Utilized During Treatment: Gait belt Activity Tolerance: Patient tolerated treatment well Patient left: in chair;with call bell/phone within reach;with chair alarm set Nurse Communication: Mobility status PT Visit Diagnosis: Difficulty in walking, not elsewhere classified (R26.2)    Time: 8456-8396 PT Time Calculation (min) (ACUTE ONLY): 20 min   Charges:   PT Evaluation $PT Eval Low Complexity: 1 Low   PT General Charges $$ ACUTE PT VISIT: 1 Visit        Tari KLEIN, DPT Physical Therapist Acute Rehabilitation Services Office: [phone]   Tari CROME [place] 12/13/2023, 4:46 PM

## 2023-12-13 NOTE — Plan of Care (Signed)
  Problem: Clinical Measurements: Goal: Will remain free from infection Outcome: Progressing Goal: Diagnostic test results will improve Outcome: Progressing   Problem: Nutrition: Goal: Adequate nutrition will be maintained Outcome: Progressing   Problem: Coping: Goal: Level of anxiety will decrease Outcome: Progressing   Problem: Pain Managment: Goal: General experience of comfort will improve and/or be controlled Outcome: Progressing

## 2023-12-14 DIAGNOSIS — D62 Acute posthemorrhagic anemia: Secondary | ICD-10-CM

## 2023-12-14 DIAGNOSIS — K625 Hemorrhage of anus and rectum: Secondary | ICD-10-CM

## 2023-12-14 DIAGNOSIS — F0390 Unspecified dementia without behavioral disturbance: Secondary | ICD-10-CM

## 2023-12-14 LAB — BPAM RBC
Blood Product Expiration Date: 202508162359
Blood Product Expiration Date: 202508182359
Blood Product Expiration Date: 202508182359
ISSUE DATE / TIME: 202507231030
ISSUE DATE / TIME: 202507231636
Unit Type and Rh: 5100
Unit Type and Rh: 5100
Unit Type and Rh: 5100

## 2023-12-14 LAB — TYPE AND SCREEN
ABO/RH(D): O POS
ABO/RH(D): O POS
Antibody Screen: NEGATIVE
Antibody Screen: NEGATIVE
Unit division: 0
Unit division: 0
Unit division: 0

## 2023-12-14 LAB — HEMOGLOBIN AND HEMATOCRIT, BLOOD
HCT: 34.6 % — ABNORMAL LOW (ref 36.0–46.0)
Hemoglobin: 10.5 g/dL — ABNORMAL LOW (ref 12.0–15.0)

## 2023-12-14 LAB — CBC
HCT: 32.6 % — ABNORMAL LOW (ref 36.0–46.0)
Hemoglobin: 10.3 g/dL — ABNORMAL LOW (ref 12.0–15.0)
MCH: 28.2 pg (ref 26.0–34.0)
MCHC: 31.6 g/dL (ref 30.0–36.0)
MCV: 89.3 fL (ref 80.0–100.0)
Platelets: 184 K/uL (ref 150–400)
RBC: 3.65 MIL/uL — ABNORMAL LOW (ref 3.87–5.11)
RDW: 14.4 % (ref 11.5–15.5)
WBC: 4.8 K/uL (ref 4.0–10.5)
nRBC: 0 % (ref 0.0–0.2)

## 2023-12-14 LAB — BASIC METABOLIC PANEL WITH GFR
Anion gap: 7 (ref 5–15)
BUN: 18 mg/dL (ref 8–23)
CO2: 26 mmol/L (ref 22–32)
Calcium: 9.2 mg/dL (ref 8.9–10.3)
Chloride: 106 mmol/L (ref 98–111)
Creatinine, Ser: 0.69 mg/dL (ref 0.44–1.00)
GFR, Estimated: >60 mL/min (ref >60)
Glucose, Bld: 116 mg/dL — ABNORMAL HIGH (ref 70–99)
Potassium: 4.5 mmol/L (ref 3.5–5.1)
Sodium: 139 mmol/L (ref 135–145)

## 2023-12-14 LAB — GLUCOSE, CAPILLARY
Glucose-Capillary: 113 mg/dL — ABNORMAL HIGH (ref 70–99)
Glucose-Capillary: 147 mg/dL — ABNORMAL HIGH (ref 70–99)
Glucose-Capillary: 103 mg/dL — ABNORMAL HIGH (ref 70–99)
Glucose-Capillary: 154 mg/dL — ABNORMAL HIGH (ref 70–99)

## 2023-12-14 LAB — MAGNESIUM: Magnesium: 1.7 mg/dL (ref 1.7–2.4)

## 2023-12-14 NOTE — Progress Notes (Signed)
 Sharon Conway Progress Note  CC:  GI bleed   Subjective: She is sitting up in the chair eating breakfast.  She denies having any abdominal pain.  She passed a smear of dark red blood x 1 earlier this morning as reported per the nursing staff.  No chest pain or shortness of breath.  No family at the bedside.   Objective:  Vital signs in last 24 hours: Temp:  [97.5 F (36.4 C)-98.2 F (36.8 C)] 97.8 F (36.6 C) (07/25 0321) Pulse Rate:  [60-88] 88 (07/25 0321) Resp:  [18-19] 18 (07/25 0321) BP: (123-137)/(77-99) 123/87 (07/25 0321) SpO2:  [93 %-97 %] 93 % (07/25 0321) Last BM Date : 12/14/23 General: 89 year old female slightly hard of hearing in no acute distress. Heart: Regular rate and rhythm, systolic murmur. Pulm: Breath sounds clear, decreased in the bases. Abdomen: Soft, nondistended.  Nontender.  Positive bowel sounds to all 4 quadrants. Extremities: No lower extremity edema. Neurologic:  Alert and oriented x 1.  Speech is slightly difficult to comprehend as she is edentulous.  She moves all extremities equally. Psych:  Alert and cooperative. Normal mood and affect.  Intake/Output from previous day: 07/24 0701 - 07/25 0700 In: 600 [I.V.:600] Out: -  Intake/Output this shift: No intake/output data recorded.  Lab Results: Recent Labs    12/12/23 0522 12/12/23 1540 12/13/23 0504 12/13/23 1649 12/14/23 0434  WBC 3.9*  --  5.5  --  4.8  HGB 7.2*   < > 10.2* 11.2* 10.3*  HCT 25.3*   < > 33.9* 36.2 32.6*  PLT 174  --  141*  --  184   < > = values in this interval not displayed.   BMET Recent Labs    12/13/23 0504 12/14/23 0434  NA 135 139  K 4.3 4.5  CL 104 106  CO2 26 26  GLUCOSE 117* 116*  BUN 24* 18  CREATININE 0.77 0.69  CALCIUM 8.3* 9.2   LFT No results for input(s): PROT, ALBUMIN, AST, ALT, ALKPHOS, BILITOT, BILIDIR, IBILI in the last 72 hours. PT/INR No results for input(s): LABPROT, INR in the last 72  hours. Hepatitis Panel No results for input(s): HEPBSAG, HCVAB, HEPAIGM, HEPBIGM in the last 72 hours.  CT ANGIO GI BLEED Result Date: 12/12/2023 CLINICAL DATA:  Lower GI bleed (Ped 0-17y) EXAM: CTA ABDOMEN AND PELVIS WITHOUT AND WITH CONTRAST TECHNIQUE: Multidetector CT imaging of the abdomen and pelvis was performed using the standard protocol during bolus administration of intravenous contrast. Multiplanar reconstructed images and MIPs were obtained and reviewed to evaluate the vascular anatomy. RADIATION DOSE REDUCTION: This exam was performed according to the departmental dose-optimization program which includes automated exposure control, adjustment of the mA and/or kV according to patient size and/or use of iterative reconstruction technique. CONTRAST:  80mL OMNIPAQUE  IOHEXOL  350 MG/ML SOLN COMPARISON:  None Available. FINDINGS: VASCULAR No extravasation of intravenous contrast within the lumen of the bowel. Limited evaluation due to timing of contrast. Aorta: Severe atherosclerotic plaque. Normal caliber aorta without aneurysm, dissection, vasculitis or significant stenosis. Celiac: Mild atherosclerotic plaque. Patent without evidence of aneurysm, dissection, vasculitis or significant stenosis. SMA: Moderate to severe atherosclerotic plaque. Patent without evidence of aneurysm, dissection, vasculitis or significant stenosis. Renals: Severe atherosclerotic plaque. Both renal arteries are patent without evidence of aneurysm, dissection, vasculitis, fibromuscular dysplasia or significant stenosis. IMA: Patent without evidence of aneurysm, dissection, vasculitis or significant stenosis. Inflow: Severe atherosclerotic plaque. Patent without evidence of aneurysm, dissection, vasculitis or significant stenosis.  Proximal Outflow: Severe atherosclerotic plaque. Bilateral common femoral and visualized portions of the superficial and profunda femoral arteries are patent without evidence of aneurysm,  dissection, vasculitis or significant stenosis. Veins: The main portal, superior mesenteric, splenic veins are patent. Review of the MIP images confirms the above findings. NON-VASCULAR Lower chest: Large hiatal hernia containing majority of stomach. Hepatobiliary: No focal liver abnormality. No gallstones, gallbladder wall thickening, or pericholecystic fluid. No biliary dilatation. Pancreas: Diffusely atrophic. No focal lesion. Otherwise normal pancreatic contour. No surrounding inflammatory changes. No main pancreatic ductal dilatation. Spleen: Normal in size without focal abnormality. Adrenals/Urinary Tract: No adrenal nodule bilaterally. Bilateral kidneys enhance symmetrically. No hydronephrosis. No hydroureter. Slightly irregular mild urinary bladder wall thickening. The urinary bladder is distended with urine. Stomach/Bowel: Stomach is within normal limits. No evidence of bowel wall thickening or dilatation. Colonic diverticulosis. Under distended rectum. Suggestion of appendectomy. Lymphatic: No lymphadenopathy. Reproductive: Status post hysterectomy. No adnexal masses. Other: No intraperitoneal free fluid. No intraperitoneal free gas. No organized fluid collection. Musculoskeletal: Tiny fat containing umbilical hernia. No suspicious lytic or blastic osseous lesions. No acute displaced fracture. Grade 2 anterolisthesis of L4 on L5. IMPRESSION: VASCULAR 1. No CT evidence of acute gastrointestinal bleed. Limited evaluation due to timing of contrast. 2.  Aortic Atherosclerosis (ICD10-I70.0)-severe. NON-VASCULAR 1. Large hiatal hernia containing the entire stomach with no findings of volvulus/obstruction. 2. Slightly irregular mild urinary bladder wall thickening. Correlate with urinalysis for infection. 3. Colonic diverticulosis with no acute diverticulitis. Electronically Signed   By: Morgane  Naveau M.D.   On: 12/12/2023 20:28   Patient Profile: Sharon Conway is a 89 y.o. female with a past medical history of  hypertension, DM type II, hyperthyroidism, cognitive impairment, IDA and lower GI bleed in 2017 and 2018.  Admitted 12/10/2023 with bright red blood per the rectum.  Assessment / Plan:  89 year old female admitted 12/10/2023 with bright red hematochezia, suspect diverticular bleed. CTA 7/23 negative for active GI bleeding. Hg 9.9 -> 8 -> 7.2 - > transfused one unit of PRBCs -> then passed large volume hematochezia -> transfused a 2nd unit of PRBCs 7/23 -> Hg 10.2 -> 11.2 -> today Hg 10.3.  No hematochezia overnight.  Nursing staff reported she had a smear of dark red blood per the rectum earlier this morning.  Hemodynamically stable. -Continue soft diet -Continue to monitor patient for active GI bleeding -Transfuse for hemoglobin level less than 8 or as needed if symptomatic -No plans for endoscopic evaluation -Await further recommendations per Dr. Stacia   Questionable melena. Daughter endorsed mother has passed black loose stools for the past week. On oral iron  at home, stools typically brown in color at home. At risk for [facility] lesions/upper GI bleeding due to having a large hiatal hernia.  - Pantoprazole  40 mg IV daily - Continue to monitor bowel movements, nursing staff to record - See plan above   Large hiatal hernia per CTA containing the entire stomach without volvulus or obstruction.   Chronic IDA on oral iron  at home      Principal Problem:   BRBPR (bright red blood per rectum) Active Problems:   GI bleed   Hyperthyroidism   Normocytic anemia   Cirrhosis of liver (HCC)     LOS: 3 days   Sharon Conway  12/14/2023, 11:38 AM

## 2023-12-14 NOTE — Plan of Care (Signed)
  Problem: Clinical Measurements: Goal: Ability to maintain clinical measurements within normal limits will improve Outcome: Progressing Goal: Will remain free from infection Outcome: Progressing   Problem: Safety: Goal: Ability to remain free from injury will improve Outcome: Progressing   

## 2023-12-14 NOTE — Progress Notes (Signed)
 Physical Therapy Treatment Patient Details Name: Sharon Conway MRN: [id number] DOB: 07-06-1918 Today's Date: 12/14/2023   History of Present Illness Pt is a 89 year old female who admitted for BRBPR (bright red blood per rectum) and Acute blood loss anemia secondary to GI bleed. PMHx: non-insulin -dependent diabetes mellitus, diverticulosis with history of GI bleed in the past and hypertension    PT Comments  Pt asleep on tray table in recliner on arrival to room. Pt agreeable to ambulate and then wished to return to recliner instead of bed.  Recliner placed in more of a reclined position for pt comfort/safety (in case she naps again).       If plan is discharge home, recommend the following: Assistance with cooking/housework;Assist for transportation;Help with stairs or ramp for entrance   Can travel by private vehicle        Equipment Recommendations  None recommended by PT    Recommendations for Other Services       Precautions / Restrictions Precautions Precautions: Fall     Mobility  Bed Mobility               General bed mobility comments: pt in recliner    Transfers Overall transfer level: Needs assistance Equipment used: Rolling walker (2 wheels) Transfers: Sit to/from Stand Sit to Stand: Min assist           General transfer comment: verbal cues for hand placement; light assist to rise and stabilize    Ambulation/Gait Ambulation/Gait assistance: Contact guard assist Gait Distance (Feet): 200 Feet Assistive device: Rolling walker (2 wheels) Gait Pattern/deviations: Step-through pattern, Decreased stride length, Trunk flexed Gait velocity: decr     General Gait Details: verbal cues for RW positioning and posture, no symptoms reported; HR 110 bpm upon returning to room   Stairs             Wheelchair Mobility     Tilt Bed    Modified Rankin (Stroke Patients Only)       Balance                                             Communication Communication Communication: Impaired Factors Affecting Communication: Hearing impaired  Cognition Arousal: Alert Behavior During Therapy: Flat affect                             Following commands: Intact      Cueing Cueing Techniques: Verbal cues  Exercises      General Comments        Pertinent Vitals/Pain Pain Assessment Pain Assessment: No/denies pain    Home Living                          Prior Function            PT Goals (current goals can now be found in the care plan section) Progress towards PT goals: Progressing toward goals    Frequency    Min 3X/week      PT Plan      Co-evaluation              AM-PAC PT 6 Clicks Mobility   Outcome Measure  Help needed turning from your back to your side while in a flat bed without using bedrails?: A Little Help needed moving  from lying on your back to sitting on the side of a flat bed without using bedrails?: A Little Help needed moving to and from a bed to a chair (including a wheelchair)?: A Little Help needed standing up from a chair using your arms (e.g., wheelchair or bedside chair)?: A Little Help needed to walk in hospital room?: A Little Help needed climbing 3-5 steps with a railing? : A Little 6 Click Score: 18    End of Session Equipment Utilized During Treatment: Gait belt Activity Tolerance: Patient tolerated treatment well Patient left: in chair;with call bell/phone within reach;with chair alarm set   PT Visit Diagnosis: Difficulty in walking, not elsewhere classified (R26.2)     Time: 1545-1600 PT Time Calculation (min) (ACUTE ONLY): 15 min  Charges:    $Gait Training: 8-22 mins PT General Charges $$ ACUTE PT VISIT: 1 Visit                     Tari KLEIN, DPT Physical Therapist Acute Rehabilitation Services Office: [phone]    Tari CROME [place] 12/14/2023, 4:53 PM

## 2023-12-14 NOTE — TOC Progression Note (Signed)
 Transition of Care [facility]) - Progression Note    Patient Details  Name: Sharon Conway MRN: [id number] Date of Birth: 1918-08-14  [facility]) CM/SW Contact  Sufian Ravi, Nathanel, RN Phone Number: 12/14/2023, 1:54 PM  Clinical Narrative: Patient recc for HHPT;[facility] informed by vm to Crystal to set up HHPT. Has own transport home.      Expected Discharge Plan: Home w Home Health Services Barriers to Discharge: Continued Medical Work up               Expected Discharge Plan and Services   Discharge Planning Services: CM Consult Post Acute Care Choice: Resumption of Svcs/PTA Provider Living arrangements for the past 2 months: Single Family Home                                       Social Drivers of Health (SDOH) Interventions SDOH Screenings   Food Insecurity: No Food Insecurity (12/10/2023)  Housing: Low Risk  (12/10/2023)  Transportation Needs: No Transportation Needs (12/10/2023)  Utilities: Not At Risk (12/10/2023)  Social Connections: Patient Declined (12/10/2023)  Tobacco Use: Low Risk  (12/10/2023)    Readmission Risk Interventions     No data to display

## 2023-12-14 NOTE — Progress Notes (Signed)
 Mobility Specialist - Progress Note  Pre-mobility:  90 bpm HR,  During mobility: 121 bpm HR,  Post-mobility: 101 bpm HR,    12/14/23 1139  Mobility  Activity Ambulated with assistance in hallway  Level of Assistance Contact guard assist, steadying assist  Assistive Device Front wheel walker  Distance Ambulated (ft) 250 ft  Range of Motion/Exercises Active  Activity Response Tolerated well  Mobility Referral Yes  Mobility visit 1 Mobility  Mobility Specialist Start Time (ACUTE ONLY) 1120  Mobility Specialist Stop Time (ACUTE ONLY) 1139  Mobility Specialist Time Calculation (min) (ACUTE ONLY) 19 min   Pt was found on recliner chair and agreeable to ambulate. No complaints with session. Returned to recliner chair with all needs met. Call bell in reach.  [name],  Mobility Specialist Can be reached via Secure Chat

## 2023-12-14 NOTE — Progress Notes (Signed)
 [facility] PROGRESS NOTE  Sharon Conway (DOB: 17-Jan-1919) FMW:[id number] PCP: Cloria Annabella CROME, DO  Brief Narrative: Sharon Conway is a 89 year old female with non-insulin -dependent diabetes mellitus, diverticulosis with history of GI bleed in the past requiring embolization and hypertension who presented to the hospital from facility secondary to large amount of bright red blood found in her sheets on the morning of presentation. EMS was called in and her blood pressure was slightly low with tachycardia. Patient denies any abdominal pain. In the ED, hemoglobin was noted to be 9.9 from baseline 11.9 in 2024 and was admitted hospital for further evaluation and treatment.  Subjective: Has no complaints, denies abdominal pain. RN reports dark red stool on sheets this morning.   Objective: BP (!) 152/86 (BP Location: Right Arm)   Pulse 96   Temp 97.7 F (36.5 C) (Oral)   Resp 18   Ht 5' 3 (1.6 m)   Wt 49.5 kg   SpO2 93%   BMI 19.33 kg/m   Gen: Elderly female in no distress Pulm: Clear, nonlabored  CV: RRR, no MRG GI: Soft, NT, ND, +BS  Neuro: Alert and oriented. No new focal deficits. Ext: Warm, no deformities. Skin: No rashes, lesions or ulcers on visualized skin   Assessment & Plan: BRBPR: Continue to suspect recurrent diverticular bleeding, though CTA 7/23 negative for extravasation.  - Hgb stable, BUN trending downward, though lower source suspected. Some residua vs. trickle ongoing bleeding noted this AM. Given her transfusion-dependent anemia and age, will plan to monitor, pending GI recommendations (though no endoscopic evaluation is planned), ideally would have 24 hours of no output concerning for bleed prior to discharge. - ADvance diet to soft. - Continuing PPI   Acute blood loss anemia secondary to GI bleed:  - s/p 2u RBCs total on 7/23  - Hgb stable today. Keep T&S up to date and recheck in AM.    Hypoxia: Resolved. Unclear etiology. Chest x-ray negative for  infiltrate.  Currently on room air.  D-dimer 0.6.    Hyperthyroidism: Stable.  - Continue tapazole .   HTN:  - Holding losartan  and metoprolol  thus far. BP may be starting to rise, so consider restart soon.     NIDT2DM:  HbA1c 7.5% which is within goal for this 89 year old.  - Hold metformin .    Advanced age, frailty.  Patient lives with her daughter at home. - HH-PT at discharge.   Bernardino KATHEE Come, MD [facility] [email or web address] 12/14/2023, 2:41 PM

## 2023-12-15 DIAGNOSIS — K625 Hemorrhage of anus and rectum: Secondary | ICD-10-CM | POA: Diagnosis not present

## 2023-12-15 LAB — GLUCOSE, CAPILLARY
Glucose-Capillary: 109 mg/dL — ABNORMAL HIGH (ref 70–99)
Glucose-Capillary: 227 mg/dL — ABNORMAL HIGH (ref 70–99)

## 2023-12-15 LAB — HEMOGLOBIN AND HEMATOCRIT, BLOOD
HCT: 32.8 % — ABNORMAL LOW (ref 36.0–46.0)
Hemoglobin: 10.1 g/dL — ABNORMAL LOW (ref 12.0–15.0)

## 2023-12-15 NOTE — Plan of Care (Signed)
 Problem: Education: Goal: Knowledge of General Education information will improve Description: Including pain rating scale, medication(s)/side effects and non-pharmacologic comfort measures 12/15/2023 1342 by Terance Leonor BRAVO, RN Outcome: Adequate for Discharge 12/15/2023 0728 by Terance Leonor BRAVO, RN Outcome: Progressing   Problem: Health Behavior/Discharge Planning: Goal: Ability to manage health-related needs will improve 12/15/2023 1342 by Terance Leonor BRAVO, RN Outcome: Adequate for Discharge 12/15/2023 0728 by Terance Leonor BRAVO, RN Outcome: Progressing   Problem: Clinical Measurements: Goal: Ability to maintain clinical measurements within normal limits will improve 12/15/2023 1342 by Terance Leonor BRAVO, RN Outcome: Adequate for Discharge 12/15/2023 9271 by Terance Leonor BRAVO, RN Outcome: Progressing Goal: Will remain free from infection 12/15/2023 1342 by Terance Leonor BRAVO, RN Outcome: Adequate for Discharge 12/15/2023 0728 by Terance Leonor BRAVO, RN Outcome: Progressing Goal: Diagnostic test results will improve 12/15/2023 1342 by Terance Leonor BRAVO, RN Outcome: Adequate for Discharge 12/15/2023 9271 by Terance Leonor BRAVO, RN Outcome: Progressing Goal: Respiratory complications will improve 12/15/2023 1342 by Terance Leonor BRAVO, RN Outcome: Adequate for Discharge 12/15/2023 9271 by Terance Leonor BRAVO, RN Outcome: Progressing Goal: Cardiovascular complication will be avoided 12/15/2023 1342 by Terance Leonor BRAVO, RN Outcome: Adequate for Discharge 12/15/2023 0728 by Terance Leonor BRAVO, RN Outcome: Progressing   Problem: Activity: Goal: Risk for activity intolerance will decrease 12/15/2023 1342 by Terance Leonor BRAVO, RN Outcome: Adequate for Discharge 12/15/2023 9271 by Terance Leonor BRAVO, RN Outcome: Progressing   Problem: Nutrition: Goal: Adequate nutrition will be maintained 12/15/2023 1342 by Terance Leonor BRAVO, RN Outcome: Adequate for  Discharge 12/15/2023 0728 by Terance Leonor BRAVO, RN Outcome: Progressing   Problem: Coping: Goal: Level of anxiety will decrease 12/15/2023 1342 by Terance Leonor BRAVO, RN Outcome: Adequate for Discharge 12/15/2023 0728 by Terance Leonor BRAVO, RN Outcome: Progressing   Problem: Elimination: Goal: Will not experience complications related to bowel motility 12/15/2023 1342 by Terance Leonor BRAVO, RN Outcome: Adequate for Discharge 12/15/2023 0728 by Terance Leonor BRAVO, RN Outcome: Progressing Goal: Will not experience complications related to urinary retention 12/15/2023 1342 by Terance Leonor BRAVO, RN Outcome: Adequate for Discharge 12/15/2023 0728 by Terance Leonor BRAVO, RN Outcome: Progressing   Problem: Pain Managment: Goal: General experience of comfort will improve and/or be controlled 12/15/2023 1342 by Terance Leonor BRAVO, RN Outcome: Adequate for Discharge 12/15/2023 0728 by Terance Leonor BRAVO, RN Outcome: Progressing   Problem: Safety: Goal: Ability to remain free from injury will improve 12/15/2023 1342 by Terance Leonor BRAVO, RN Outcome: Adequate for Discharge 12/15/2023 0728 by Terance Leonor BRAVO, RN Outcome: Progressing   Problem: Skin Integrity: Goal: Risk for impaired skin integrity will decrease 12/15/2023 1342 by Terance Leonor BRAVO, RN Outcome: Adequate for Discharge 12/15/2023 0728 by Terance Leonor BRAVO, RN Outcome: Progressing   Problem: Education: Goal: Ability to describe self-care measures that may prevent or decrease complications (Diabetes Survival Skills Education) will improve 12/15/2023 1342 by Terance Leonor BRAVO, RN Outcome: Adequate for Discharge 12/15/2023 0728 by Terance Leonor BRAVO, RN Outcome: Progressing Goal: Individualized Educational Video(s) 12/15/2023 1342 by Terance Leonor BRAVO, RN Outcome: Adequate for Discharge 12/15/2023 0728 by Terance Leonor BRAVO, RN Outcome: Progressing   Problem: Coping: Goal: Ability to adjust to condition  or change in health will improve 12/15/2023 1342 by Terance Leonor BRAVO, RN Outcome: Adequate for Discharge 12/15/2023 9271 by Terance Leonor BRAVO, RN Outcome: Progressing   Problem: Fluid Volume: Goal: Ability to maintain a balanced intake and output will improve 12/15/2023 1342 by Terance Leonor BRAVO, RN Outcome: Adequate for Discharge 12/15/2023  9271 by Terance Leonor BRAVO, RN Outcome: Progressing   Problem: Health Behavior/Discharge Planning: Goal: Ability to identify and utilize available resources and services will improve 12/15/2023 1342 by Terance Leonor BRAVO, RN Outcome: Adequate for Discharge 12/15/2023 9271 by Terance Leonor BRAVO, RN Outcome: Progressing Goal: Ability to manage health-related needs will improve 12/15/2023 1342 by Terance Leonor BRAVO, RN Outcome: Adequate for Discharge 12/15/2023 0728 by Terance Leonor BRAVO, RN Outcome: Progressing   Problem: Metabolic: Goal: Ability to maintain appropriate glucose levels will improve 12/15/2023 1342 by Terance Leonor BRAVO, RN Outcome: Adequate for Discharge 12/15/2023 0728 by Terance Leonor BRAVO, RN Outcome: Progressing   Problem: Nutritional: Goal: Maintenance of adequate nutrition will improve 12/15/2023 1342 by Terance Leonor BRAVO, RN Outcome: Adequate for Discharge 12/15/2023 0728 by Terance Leonor BRAVO, RN Outcome: Progressing Goal: Progress toward achieving an optimal weight will improve 12/15/2023 1342 by Terance Leonor BRAVO, RN Outcome: Adequate for Discharge 12/15/2023 0728 by Terance Leonor BRAVO, RN Outcome: Progressing   Problem: Skin Integrity: Goal: Risk for impaired skin integrity will decrease 12/15/2023 1342 by Terance Leonor BRAVO, RN Outcome: Adequate for Discharge 12/15/2023 0728 by Terance Leonor BRAVO, RN Outcome: Progressing   Problem: Tissue Perfusion: Goal: Adequacy of tissue perfusion will improve 12/15/2023 1342 by Terance Leonor BRAVO, RN Outcome: Adequate for Discharge 12/15/2023 0728 by  Terance Leonor BRAVO, RN Outcome: Progressing

## 2023-12-15 NOTE — TOC Transition Note (Signed)
 Transition of Care [facility]) - Discharge Note   Patient Details  Name: Sharon Conway MRN: [id number] Date of Birth: [birth year]  Transition of Care [facility]) CM/SW Contact:  Sonda Manuella Quill, RN Phone Number: 12/15/2023, 12:18 PM   Clinical Narrative:    D/C orders received; VM previously left for Crystal [facility] to set up HHPT/OT; no TOC needs.   Final next level of care: Home w Home Health Services Barriers to Discharge: No Barriers Identified   Patient Goals and CMS Choice Patient states their goals for this hospitalization and ongoing recovery are:: Home CMS Medicare.gov Compare Post Acute Care list provided to:: Patient Represenative (must comment) ([name](dtr))   [place] ownership interest in [facility].provided to:: Adult Children    Discharge Placement                       Discharge Plan and Services Additional resources added to the After Visit Summary for     Discharge Planning Services: CM Consult Post Acute Care Choice: Resumption of Svcs/PTA Provider                    HH Arranged: PT, OT HH Agency: Other - See comment ([facility] will arrange HHPT/OT; VM previously left for Crystal) Date [facility] Agency Contacted: 12/14/23      Social Drivers of Health (SDOH) Interventions SDOH Screenings   Food Insecurity: No Food Insecurity (12/10/2023)  Housing: Low Risk  (12/10/2023)  Transportation Needs: No Transportation Needs (12/10/2023)  Utilities: Not At Risk (12/10/2023)  Social Connections: Patient Declined (12/10/2023)  Tobacco Use: Low Risk  (12/10/2023)     Readmission Risk Interventions     No data to display

## 2023-12-15 NOTE — Progress Notes (Signed)
 Mobility Specialist - Progress Note  Pre-mobility: 93 bpm HR,  During mobility: 113 bpm HR,  Post-mobility: 102 bpm HR,    12/15/23 0900  Mobility  Activity Ambulated with assistance in hallway  Level of Assistance Minimal assist, patient does 75% or more  Assistive Device Front wheel walker  Distance Ambulated (ft) 200 ft  Range of Motion/Exercises Active  Activity Response Tolerated well  Mobility Referral Yes  Mobility visit 1 Mobility  Mobility Specialist Start Time (ACUTE ONLY) 0840  Mobility Specialist Stop Time (ACUTE ONLY) 0900  Mobility Specialist Time Calculation (min) (ACUTE ONLY) 20 min   Pt was found in bed saturated and agreeable to ambulate. Assisted with pericare and ambulated to bathroom prior to hallway ambulation. At EOS returned to recliner chair with all needs met. Call bell in reach. NT notified.  [name],  Mobility Specialist Can be reached via Secure Chat

## 2023-12-15 NOTE — Plan of Care (Signed)

## 2023-12-15 NOTE — Discharge Summary (Signed)
 Physician Discharge Summary   Patient: Sharon Conway MRN: [id number] DOB: 1919-02-28  Admit date:     12/10/2023  Discharge date: 12/15/23  Discharge Physician: Drue ONEIDA Potter   PCP: Cloria Annabella CROME, DO   Recommendations at discharge:  Follow-up with PCP  Discharge Diagnoses: BRBPR: Continue to suspect recurrent diverticular bleeding Acute blood loss anemia secondary to GI bleed:  Hypoxia: Hyperthyroidism: Stable.  HTN:  [id number]: Advanced age, frailty.   Hospital Course: Sharon Conway is a 89 year old female with non-insulin -dependent diabetes mellitus, diverticulosis with history of GI bleed in the past requiring embolization and hypertension who presented to the hospital from facility secondary to large amount of bright red blood found in her sheets on the morning of presentation. EMS was called in and her blood pressure was slightly low with tachycardia. Patient denies any abdominal pain.  Patient received 2 units of blood transfusion and hemoglobin have been stable posttransfusion.  Gastroenterologist have signed off and patient is therefore being discharged today to follow-up as an outpatient.  Consultants: Gastroenterology Procedures performed: None Disposition: Home Diet recommendation:  Cardiac diet DISCHARGE MEDICATION: Allergies as of 12/15/2023   No Known Allergies      Medication List     TAKE these medications    albuterol  108 (90 Base) MCG/ACT inhaler Commonly known as: VENTOLIN  HFA Inhale 2 puffs into the lungs every 6 (six) hours as needed for wheezing or shortness of breath.   clotrimazole 1 % cream Commonly known as: LOTRIMIN Apply 1 Application topically daily as needed (athletes foot). Apply to affected areas between toes nightly as needed.   diclofenac Sodium 1 % Gel Commonly known as: VOLTAREN Apply 1 application topically 2 (two) times daily as needed (arthritis pain).   famotidine  10 MG tablet Commonly known as: PEPCID  Take 10 mg by mouth daily.    feeding supplement Liqd Take 237 mLs by mouth 2 (two) times daily between meals. What changed: when to take this   ferrous sulfate 325 (65 FE) MG EC tablet Take 325 mg by mouth See admin instructions. Take 1 tablet 3 times a week (Monday,Wednesday & Friday)   fluticasone 50 MCG/ACT nasal spray Commonly known as: FLONASE Place 1 spray into both nostrils daily as needed for allergies or rhinitis.   gabapentin  100 MG capsule Commonly known as: NEURONTIN  Take 100 mg by mouth at bedtime.   guaiFENesin -dextromethorphan  100-10 MG/5ML syrup Commonly known as: ROBITUSSIN DM Take 10 mLs by mouth every 4 (four) hours as needed for cough.   losartan  25 MG tablet Commonly known as: COZAAR  Take 25 mg by mouth daily.   lubriderm seriously sensitive Lotn Apply 1 Application topically daily as needed (dry skin). And after showers to all extremities.   metFORMIN  500 MG tablet Commonly known as: GLUCOPHAGE  Take 500 mg by mouth 2 (two) times daily with a meal.   methimazole  5 MG tablet Commonly known as: TAPAZOLE  Take 5 mg by mouth daily.   metoprolol  succinate 25 MG 24 hr tablet Commonly known as: TOPROL -XL Take 12.5 mg by mouth 2 (two) times daily.   polyethylene glycol powder 17 GM/SCOOP powder Commonly known as: GLYCOLAX /MIRALAX  Take 17 g by mouth daily as needed for mild constipation.   PreserVision AREDS 2 Caps Take 1 capsule by mouth daily.        Discharge Exam: Filed Weights   12/10/23 0928 12/10/23 1321  Weight: 55 kg 49.5 kg   General: 89 year old female slightly hard of hearing in no acute distress. Heart: Regular rate and  rhythm, systolic murmur. Pulm: Breath sounds clear, decreased in the bases. Abdomen: Soft, nondistended.  Nontender.  Positive bowel sounds to all 4 quadrants. Extremities: No lower extremity edema. Neurologic:  Alert and oriented x 1.  Speech is slightly difficult to comprehend as she is edentulous.  She moves all extremities equally. Psych:   Alert and cooperative. Normal mood and affect.  Condition at discharge: good  The results of significant diagnostics from this hospitalization (including imaging, microbiology, ancillary and laboratory) are listed below for reference.   Imaging Studies: CT ANGIO GI BLEED Result Date: 12/12/2023 CLINICAL DATA:  Lower GI bleed (Ped 0-17y) EXAM: CTA ABDOMEN AND PELVIS WITHOUT AND WITH CONTRAST TECHNIQUE: Multidetector CT imaging of the abdomen and pelvis was performed using the standard protocol during bolus administration of intravenous contrast. Multiplanar reconstructed images and MIPs were obtained and reviewed to evaluate the vascular anatomy. RADIATION DOSE REDUCTION: This exam was performed according to the departmental dose-optimization program which includes automated exposure control, adjustment of the mA and/or kV according to patient size and/or use of iterative reconstruction technique. CONTRAST:  80mL OMNIPAQUE  IOHEXOL  350 MG/ML SOLN COMPARISON:  None Available. FINDINGS: VASCULAR No extravasation of intravenous contrast within the lumen of the bowel. Limited evaluation due to timing of contrast. Aorta: Severe atherosclerotic plaque. Normal caliber aorta without aneurysm, dissection, vasculitis or significant stenosis. Celiac: Mild atherosclerotic plaque. Patent without evidence of aneurysm, dissection, vasculitis or significant stenosis. SMA: Moderate to severe atherosclerotic plaque. Patent without evidence of aneurysm, dissection, vasculitis or significant stenosis. Renals: Severe atherosclerotic plaque. Both renal arteries are patent without evidence of aneurysm, dissection, vasculitis, fibromuscular dysplasia or significant stenosis. IMA: Patent without evidence of aneurysm, dissection, vasculitis or significant stenosis. Inflow: Severe atherosclerotic plaque. Patent without evidence of aneurysm, dissection, vasculitis or significant stenosis. Proximal Outflow: Severe atherosclerotic plaque.  Bilateral common femoral and visualized portions of the superficial and profunda femoral arteries are patent without evidence of aneurysm, dissection, vasculitis or significant stenosis. Veins: The main portal, superior mesenteric, splenic veins are patent. Review of the MIP images confirms the above findings. NON-VASCULAR Lower chest: Large hiatal hernia containing majority of stomach. Hepatobiliary: No focal liver abnormality. No gallstones, gallbladder wall thickening, or pericholecystic fluid. No biliary dilatation. Pancreas: Diffusely atrophic. No focal lesion. Otherwise normal pancreatic contour. No surrounding inflammatory changes. No main pancreatic ductal dilatation. Spleen: Normal in size without focal abnormality. Adrenals/Urinary Tract: No adrenal nodule bilaterally. Bilateral kidneys enhance symmetrically. No hydronephrosis. No hydroureter. Slightly irregular mild urinary bladder wall thickening. The urinary bladder is distended with urine. Stomach/Bowel: Stomach is within normal limits. No evidence of bowel wall thickening or dilatation. Colonic diverticulosis. Under distended rectum. Suggestion of appendectomy. Lymphatic: No lymphadenopathy. Reproductive: Status post hysterectomy. No adnexal masses. Other: No intraperitoneal free fluid. No intraperitoneal free gas. No organized fluid collection. Musculoskeletal: Tiny fat containing umbilical hernia. No suspicious lytic or blastic osseous lesions. No acute displaced fracture. Grade 2 anterolisthesis of L4 on L5. IMPRESSION: VASCULAR 1. No CT evidence of acute gastrointestinal bleed. Limited evaluation due to timing of contrast. 2.  Aortic Atherosclerosis (ICD10-I70.0)-severe. NON-VASCULAR 1. Large hiatal hernia containing the entire stomach with no findings of volvulus/obstruction. 2. Slightly irregular mild urinary bladder wall thickening. Correlate with urinalysis for infection. 3. Colonic diverticulosis with no acute diverticulitis. Electronically  Signed   By: Morgane  Naveau M.D.   On: 12/12/2023 20:28   DG Chest 2 View Result Date: 12/11/2023 CLINICAL DATA:  Hypoxia and cough. EXAM: CHEST - 2 VIEW COMPARISON:  Chest radiograph dated  08/08/2023. FINDINGS: No focal consolidation, pleural effusion, pneumothorax. Stable cardiac silhouette. Atherosclerotic calcification of the aorta. No acute osseous pathology. IMPRESSION: No active cardiopulmonary disease. Electronically Signed   By: Vanetta Chou M.D.   On: 12/11/2023 17:51    Microbiology: Results for orders placed or performed during the hospital encounter of 03/09/23  Resp panel by RT-PCR (RSV, Flu A&B, Covid) Anterior Nasal Swab     Status: None   Collection Time: 03/09/23 10:54 AM   Specimen: Anterior Nasal Swab  Result Value Ref Range Status   SARS Coronavirus 2 by RT PCR NEGATIVE NEGATIVE Final    Comment: (NOTE) SARS-CoV-2 target nucleic acids are NOT DETECTED.  The SARS-CoV-2 RNA is generally detectable in upper respiratory specimens during the acute phase of infection. The lowest concentration of SARS-CoV-2 viral copies this assay can detect is 138 copies/mL. A negative result does not preclude SARS-Cov-2 infection and should not be used as the sole basis for treatment or other patient management decisions. A negative result may occur with  improper specimen collection/handling, submission of specimen other than nasopharyngeal swab, presence of viral mutation(s) within the areas targeted by this assay, and inadequate number of viral copies(<138 copies/mL). A negative result must be combined with clinical observations, patient history, and epidemiological information. The expected result is Negative.  Fact Sheet for Patients:  BloggerCourse.com  Fact Sheet for Healthcare Providers:  SeriousBroker.it  This test is no t yet approved or cleared by the [place]  FDA and  has been authorized for detection and/or  diagnosis of SARS-CoV-2 by FDA under an Emergency Use Authorization (EUA). This EUA will remain  in effect (meaning this test can be used) for the duration of the COVID-19 declaration under Section 564(b)(1) of the Act, 21 U.S.C.section 360bbb-3(b)(1), unless the authorization is terminated  or revoked sooner.       Influenza A by PCR NEGATIVE NEGATIVE Final   Influenza B by PCR NEGATIVE NEGATIVE Final    Comment: (NOTE) The Xpert Xpress SARS-CoV-2/FLU/RSV plus assay is intended as an aid in the diagnosis of influenza from Nasopharyngeal swab specimens and should not be used as a sole basis for treatment. Nasal washings and aspirates are unacceptable for Xpert Xpress SARS-CoV-2/FLU/RSV testing.  Fact Sheet for Patients: BloggerCourse.com  Fact Sheet for Healthcare Providers: SeriousBroker.it  This test is not yet approved or cleared by the [place]  FDA and has been authorized for detection and/or diagnosis of SARS-CoV-2 by FDA under an Emergency Use Authorization (EUA). This EUA will remain in effect (meaning this test can be used) for the duration of the COVID-19 declaration under Section 564(b)(1) of the Act, 21 U.S.C. section 360bbb-3(b)(1), unless the authorization is terminated or revoked.     Resp Syncytial Virus by PCR NEGATIVE NEGATIVE Final    Comment: (NOTE) Fact Sheet for Patients: BloggerCourse.com  Fact Sheet for Healthcare Providers: SeriousBroker.it  This test is not yet approved or cleared by the [place]  FDA and has been authorized for detection and/or diagnosis of SARS-CoV-2 by FDA under an Emergency Use Authorization (EUA). This EUA will remain in effect (meaning this test can be used) for the duration of the COVID-19 declaration under Section 564(b)(1) of the Act, 21 U.S.C. section 360bbb-3(b)(1), unless the authorization is terminated  or revoked.  Performed at [facility], [address], [place], [place] [address]     Labs: CBC: Recent Labs  Lab 12/10/23 1010 12/10/23 1210 12/11/23 0500 12/11/23 1242 12/12/23 0522 12/12/23 1540 12/13/23 0504 12/13/23 1649  12/14/23 0434 12/14/23 1650 12/15/23 0501  WBC 6.9  --  5.8  --  3.9*  --  5.5  --  4.8  --   --   NEUTROABS 4.1  --   --   --   --   --   --   --   --   --   --   HGB 9.9*   < > 8.0*   < > 7.2*   < > 10.2* 11.2* 10.3* 10.5* 10.1*  HCT 33.7*   < > 26.7*   < > 25.3*   < > 33.9* 36.2 32.6* 34.6* 32.8*  MCV 93.1  --  92.7  --  94.8  --  90.9  --  89.3  --   --   PLT 200  --  170  --  174  --  141*  --  184  --   --    < > = values in this interval not displayed.   Basic Metabolic Panel: Recent Labs  Lab 12/10/23 1123 12/11/23 0500 12/13/23 0504 12/14/23 0434  NA 142 138 135 139  K 5.0 4.3 4.3 4.5  CL 108 102 104 106  CO2 25 28 26 26   GLUCOSE 123* 110* 117* 116*  BUN 24* 23 24* 18  CREATININE 0.75 0.86 0.77 0.69  CALCIUM 9.0 8.6* 8.3* 9.2  MG  --   --   --  1.7   Liver Function Tests: Recent Labs  Lab 12/10/23 1123  AST 17  ALT 7  ALKPHOS 43  BILITOT 0.6  PROT 6.3*  ALBUMIN 3.4*   CBG: Recent Labs  Lab 12/14/23 0737 12/14/23 1141 12/14/23 1649 12/14/23 2052 12/15/23 0739  GLUCAP 113* 147* 103* 154* 109*    Discharge time spent:  35 minutes.  Signed: Drue ONEIDA Potter, MD [facility] 12/15/2023

## 2024-05-28 ENCOUNTER — Ambulatory Visit
Admission: RE | Admit: 2024-05-28 | Discharge: 2024-05-28 | Disposition: A | Discharge status: Home / Self Care | Payer: Medicare (Managed Care) | Source: Ambulatory Visit

## 2024-05-28 ENCOUNTER — Other Ambulatory Visit: Payer: Self-pay

## 2024-05-28 ENCOUNTER — Ambulatory Visit
Admission: RE | Admit: 2024-05-28 | Discharge: 2024-05-28 | Disposition: A | Discharge status: Home / Self Care | Payer: Medicare (Managed Care) | Source: Ambulatory Visit | Attending: Family Medicine | Admitting: Family Medicine

## 2024-05-28 ENCOUNTER — Other Ambulatory Visit: Payer: Self-pay | Admitting: Family Medicine

## 2024-05-28 DIAGNOSIS — M7989 Other specified soft tissue disorders: Secondary | ICD-10-CM

## 2024-05-28 DIAGNOSIS — M79602 Pain in left arm: Secondary | ICD-10-CM
